# Patient Record
Sex: Female | Born: 1940 | Race: White | Hispanic: No | Marital: Married | Smoking: Never smoker
Health system: Southern US, Community
[De-identification: ages and names within clinical notes are randomized; demographics above are authoritative.]

## PROBLEM LIST (undated history)

## (undated) DIAGNOSIS — J309 Allergic rhinitis, unspecified: Secondary | ICD-10-CM

## (undated) DIAGNOSIS — K635 Polyp of colon: Secondary | ICD-10-CM

## (undated) DIAGNOSIS — E785 Hyperlipidemia, unspecified: Secondary | ICD-10-CM

## (undated) DIAGNOSIS — A809 Acute poliomyelitis, unspecified: Secondary | ICD-10-CM

## (undated) DIAGNOSIS — R569 Unspecified convulsions: Secondary | ICD-10-CM

## (undated) HISTORY — DX: Polyp of colon: K63.5

## (undated) HISTORY — PX: PATENT DUCTUS ARTERIOUS REPAIR: SHX269

## (undated) HISTORY — DX: Hyperlipidemia, unspecified: E78.5

## (undated) HISTORY — PX: COLONOSCOPY: SHX174

## (undated) HISTORY — DX: Allergic rhinitis, unspecified: J30.9

## (undated) HISTORY — DX: Acute poliomyelitis, unspecified: A80.9

## (undated) HISTORY — DX: Unspecified convulsions: R56.9

## (undated) HISTORY — PX: BREAST SURGERY: SHX581

## (undated) HISTORY — PX: TONSILLECTOMY: SUR1361

---

## 1980-07-20 DIAGNOSIS — R569 Unspecified convulsions: Secondary | ICD-10-CM

## 1980-07-20 HISTORY — DX: Unspecified convulsions: R56.9

## 1998-01-29 ENCOUNTER — Other Ambulatory Visit: Admission: RE | Admit: 1998-01-29 | Discharge: 1998-01-29 | Payer: Self-pay | Admitting: Obstetrics and Gynecology

## 1999-05-19 ENCOUNTER — Other Ambulatory Visit: Admission: RE | Admit: 1999-05-19 | Discharge: 1999-05-19 | Payer: Self-pay | Admitting: Obstetrics and Gynecology

## 2000-07-26 ENCOUNTER — Other Ambulatory Visit: Admission: RE | Admit: 2000-07-26 | Discharge: 2000-07-26 | Payer: Self-pay | Admitting: Obstetrics and Gynecology

## 2001-07-20 DIAGNOSIS — K635 Polyp of colon: Secondary | ICD-10-CM

## 2001-07-20 HISTORY — DX: Polyp of colon: K63.5

## 2001-08-02 ENCOUNTER — Other Ambulatory Visit: Admission: RE | Admit: 2001-08-02 | Discharge: 2001-08-02 | Payer: Self-pay | Admitting: Obstetrics and Gynecology

## 2001-10-10 ENCOUNTER — Encounter (INDEPENDENT_AMBULATORY_CARE_PROVIDER_SITE_OTHER): Payer: Self-pay | Admitting: *Deleted

## 2001-10-10 ENCOUNTER — Ambulatory Visit (HOSPITAL_COMMUNITY): Admission: RE | Admit: 2001-10-10 | Discharge: 2001-10-10 | Payer: Self-pay | Admitting: Gastroenterology

## 2001-10-10 ENCOUNTER — Encounter (INDEPENDENT_AMBULATORY_CARE_PROVIDER_SITE_OTHER): Payer: Self-pay

## 2002-08-17 ENCOUNTER — Other Ambulatory Visit: Admission: RE | Admit: 2002-08-17 | Discharge: 2002-08-17 | Payer: Self-pay | Admitting: Obstetrics and Gynecology

## 2003-09-03 ENCOUNTER — Encounter: Admission: RE | Admit: 2003-09-03 | Discharge: 2003-09-03 | Payer: Self-pay | Admitting: Internal Medicine

## 2003-10-16 ENCOUNTER — Other Ambulatory Visit: Admission: RE | Admit: 2003-10-16 | Discharge: 2003-10-16 | Payer: Self-pay | Admitting: Obstetrics and Gynecology

## 2004-06-25 ENCOUNTER — Ambulatory Visit: Payer: Self-pay | Admitting: Internal Medicine

## 2004-07-18 ENCOUNTER — Ambulatory Visit: Payer: Self-pay | Admitting: Internal Medicine

## 2005-02-25 ENCOUNTER — Ambulatory Visit: Payer: Self-pay | Admitting: Internal Medicine

## 2005-03-03 ENCOUNTER — Ambulatory Visit: Payer: Self-pay

## 2005-09-07 ENCOUNTER — Ambulatory Visit: Payer: Self-pay | Admitting: Internal Medicine

## 2005-10-21 ENCOUNTER — Ambulatory Visit: Payer: Self-pay | Admitting: Internal Medicine

## 2005-12-16 ENCOUNTER — Ambulatory Visit: Payer: Self-pay | Admitting: Internal Medicine

## 2006-02-01 ENCOUNTER — Ambulatory Visit: Payer: Self-pay | Admitting: Internal Medicine

## 2006-10-06 ENCOUNTER — Ambulatory Visit: Payer: Self-pay | Admitting: Internal Medicine

## 2006-10-14 DIAGNOSIS — E785 Hyperlipidemia, unspecified: Secondary | ICD-10-CM | POA: Insufficient documentation

## 2007-01-03 ENCOUNTER — Encounter: Payer: Self-pay | Admitting: Internal Medicine

## 2007-01-27 ENCOUNTER — Ambulatory Visit: Payer: Self-pay | Admitting: Family Medicine

## 2007-01-27 DIAGNOSIS — J209 Acute bronchitis, unspecified: Secondary | ICD-10-CM | POA: Insufficient documentation

## 2007-02-01 ENCOUNTER — Ambulatory Visit: Payer: Self-pay | Admitting: Family Medicine

## 2007-02-24 ENCOUNTER — Telehealth (INDEPENDENT_AMBULATORY_CARE_PROVIDER_SITE_OTHER): Payer: Self-pay | Admitting: *Deleted

## 2007-02-25 ENCOUNTER — Telehealth (INDEPENDENT_AMBULATORY_CARE_PROVIDER_SITE_OTHER): Payer: Self-pay | Admitting: *Deleted

## 2007-06-04 ENCOUNTER — Ambulatory Visit: Payer: Self-pay | Admitting: Family Medicine

## 2007-06-04 DIAGNOSIS — B370 Candidal stomatitis: Secondary | ICD-10-CM | POA: Insufficient documentation

## 2007-06-09 ENCOUNTER — Emergency Department (HOSPITAL_COMMUNITY): Admission: EM | Admit: 2007-06-09 | Discharge: 2007-06-09 | Payer: Self-pay | Admitting: Emergency Medicine

## 2007-06-09 ENCOUNTER — Telehealth: Payer: Self-pay | Admitting: Internal Medicine

## 2007-06-15 ENCOUNTER — Telehealth (INDEPENDENT_AMBULATORY_CARE_PROVIDER_SITE_OTHER): Payer: Self-pay | Admitting: *Deleted

## 2007-07-25 ENCOUNTER — Ambulatory Visit: Payer: Self-pay | Admitting: Internal Medicine

## 2007-07-25 ENCOUNTER — Telehealth: Payer: Self-pay | Admitting: Internal Medicine

## 2007-07-25 DIAGNOSIS — M2550 Pain in unspecified joint: Secondary | ICD-10-CM | POA: Insufficient documentation

## 2007-07-25 DIAGNOSIS — J3089 Other allergic rhinitis: Secondary | ICD-10-CM

## 2007-07-25 DIAGNOSIS — J302 Other seasonal allergic rhinitis: Secondary | ICD-10-CM | POA: Insufficient documentation

## 2007-07-27 ENCOUNTER — Ambulatory Visit: Payer: Self-pay | Admitting: Internal Medicine

## 2007-07-28 ENCOUNTER — Encounter: Payer: Self-pay | Admitting: Internal Medicine

## 2007-07-29 ENCOUNTER — Encounter (INDEPENDENT_AMBULATORY_CARE_PROVIDER_SITE_OTHER): Payer: Self-pay | Admitting: *Deleted

## 2007-07-29 ENCOUNTER — Telehealth (INDEPENDENT_AMBULATORY_CARE_PROVIDER_SITE_OTHER): Payer: Self-pay | Admitting: *Deleted

## 2007-09-01 ENCOUNTER — Ambulatory Visit: Payer: Self-pay | Admitting: Internal Medicine

## 2007-10-03 ENCOUNTER — Ambulatory Visit: Payer: Self-pay | Admitting: Internal Medicine

## 2007-10-06 ENCOUNTER — Ambulatory Visit: Payer: Self-pay | Admitting: Internal Medicine

## 2007-10-10 ENCOUNTER — Encounter: Payer: Self-pay | Admitting: Internal Medicine

## 2007-10-24 ENCOUNTER — Telehealth (INDEPENDENT_AMBULATORY_CARE_PROVIDER_SITE_OTHER): Payer: Self-pay | Admitting: *Deleted

## 2007-11-02 ENCOUNTER — Ambulatory Visit: Payer: Self-pay | Admitting: Internal Medicine

## 2008-01-06 ENCOUNTER — Telehealth (INDEPENDENT_AMBULATORY_CARE_PROVIDER_SITE_OTHER): Payer: Self-pay | Admitting: *Deleted

## 2008-01-06 ENCOUNTER — Ambulatory Visit: Payer: Self-pay | Admitting: Internal Medicine

## 2008-01-06 DIAGNOSIS — M549 Dorsalgia, unspecified: Secondary | ICD-10-CM | POA: Insufficient documentation

## 2008-01-06 LAB — CONVERTED CEMR LAB
Bilirubin Urine: NEGATIVE
Glucose, Urine, Semiquant: NEGATIVE
Urobilinogen, UA: 0.2
WBC Urine, dipstick: NEGATIVE

## 2008-01-16 ENCOUNTER — Telehealth (INDEPENDENT_AMBULATORY_CARE_PROVIDER_SITE_OTHER): Payer: Self-pay | Admitting: *Deleted

## 2008-01-30 ENCOUNTER — Ambulatory Visit: Payer: Self-pay | Admitting: Internal Medicine

## 2008-01-30 ENCOUNTER — Telehealth: Payer: Self-pay | Admitting: Internal Medicine

## 2008-05-04 ENCOUNTER — Ambulatory Visit: Payer: Self-pay | Admitting: Internal Medicine

## 2008-05-04 DIAGNOSIS — J069 Acute upper respiratory infection, unspecified: Secondary | ICD-10-CM | POA: Insufficient documentation

## 2008-05-04 LAB — CONVERTED CEMR LAB: Inflenza A Ag: NEGATIVE

## 2008-08-21 ENCOUNTER — Telehealth (INDEPENDENT_AMBULATORY_CARE_PROVIDER_SITE_OTHER): Payer: Self-pay | Admitting: *Deleted

## 2008-09-01 ENCOUNTER — Ambulatory Visit: Payer: Self-pay | Admitting: Family Medicine

## 2008-09-14 ENCOUNTER — Ambulatory Visit: Payer: Self-pay | Admitting: Internal Medicine

## 2008-09-14 DIAGNOSIS — R1032 Left lower quadrant pain: Secondary | ICD-10-CM | POA: Insufficient documentation

## 2008-09-14 DIAGNOSIS — Z86010 Personal history of colonic polyps: Secondary | ICD-10-CM | POA: Insufficient documentation

## 2008-09-17 LAB — CONVERTED CEMR LAB
Basophils Absolute: 0.1 10*3/uL (ref 0.0–0.1)
Basophils Relative: 1 % (ref 0–1)
Eosinophils Absolute: 0.2 10*3/uL (ref 0.0–0.7)
Eosinophils Relative: 4 % (ref 0–5)
Hemoglobin: 13.7 g/dL (ref 12.0–15.0)
MCHC: 33.4 g/dL (ref 30.0–36.0)
MCV: 93.6 fL (ref 78.0–100.0)
Monocytes Absolute: 0.3 10*3/uL (ref 0.1–1.0)
Neutro Abs: 3.1 10*3/uL (ref 1.7–7.7)
RDW: 13.3 % (ref 11.5–15.5)

## 2008-09-18 ENCOUNTER — Ambulatory Visit: Payer: Self-pay | Admitting: Internal Medicine

## 2008-09-18 ENCOUNTER — Encounter (INDEPENDENT_AMBULATORY_CARE_PROVIDER_SITE_OTHER): Payer: Self-pay | Admitting: *Deleted

## 2008-09-20 ENCOUNTER — Encounter (INDEPENDENT_AMBULATORY_CARE_PROVIDER_SITE_OTHER): Payer: Self-pay | Admitting: *Deleted

## 2008-09-20 ENCOUNTER — Telehealth (INDEPENDENT_AMBULATORY_CARE_PROVIDER_SITE_OTHER): Payer: Self-pay | Admitting: *Deleted

## 2008-09-26 ENCOUNTER — Encounter: Payer: Self-pay | Admitting: Internal Medicine

## 2008-09-26 ENCOUNTER — Telehealth: Payer: Self-pay | Admitting: Internal Medicine

## 2008-10-11 ENCOUNTER — Encounter: Payer: Self-pay | Admitting: Internal Medicine

## 2008-12-06 ENCOUNTER — Encounter: Payer: Self-pay | Admitting: Internal Medicine

## 2009-01-08 ENCOUNTER — Telehealth: Payer: Self-pay | Admitting: Internal Medicine

## 2009-01-09 ENCOUNTER — Ambulatory Visit: Payer: Self-pay | Admitting: Gastroenterology

## 2009-04-22 ENCOUNTER — Ambulatory Visit: Payer: Self-pay | Admitting: Internal Medicine

## 2009-05-07 ENCOUNTER — Encounter: Payer: Self-pay | Admitting: Internal Medicine

## 2009-05-16 ENCOUNTER — Encounter: Payer: Self-pay | Admitting: Internal Medicine

## 2009-05-23 ENCOUNTER — Telehealth (INDEPENDENT_AMBULATORY_CARE_PROVIDER_SITE_OTHER): Payer: Self-pay | Admitting: *Deleted

## 2009-09-09 ENCOUNTER — Encounter: Payer: Self-pay | Admitting: Internal Medicine

## 2009-09-19 ENCOUNTER — Encounter: Payer: Self-pay | Admitting: Internal Medicine

## 2009-09-30 ENCOUNTER — Telehealth (INDEPENDENT_AMBULATORY_CARE_PROVIDER_SITE_OTHER): Payer: Self-pay | Admitting: *Deleted

## 2009-09-30 ENCOUNTER — Ambulatory Visit: Payer: Self-pay | Admitting: Internal Medicine

## 2009-10-10 ENCOUNTER — Telehealth (INDEPENDENT_AMBULATORY_CARE_PROVIDER_SITE_OTHER): Payer: Self-pay | Admitting: *Deleted

## 2010-01-23 ENCOUNTER — Ambulatory Visit: Payer: Self-pay | Admitting: Internal Medicine

## 2010-04-21 ENCOUNTER — Ambulatory Visit: Payer: Self-pay | Admitting: Internal Medicine

## 2010-04-21 DIAGNOSIS — R109 Unspecified abdominal pain: Secondary | ICD-10-CM | POA: Insufficient documentation

## 2010-04-21 LAB — CONVERTED CEMR LAB
Glucose, Urine, Semiquant: NEGATIVE
Protein, U semiquant: NEGATIVE
Urobilinogen, UA: 0.2
WBC Urine, dipstick: NEGATIVE

## 2010-04-22 ENCOUNTER — Encounter: Payer: Self-pay | Admitting: Internal Medicine

## 2010-04-22 LAB — CONVERTED CEMR LAB
Bilirubin, Direct: 0.2 mg/dL (ref 0.0–0.3)
Eosinophils Absolute: 0.2 10*3/uL (ref 0.0–0.7)
MCHC: 34.1 g/dL (ref 30.0–36.0)
MCV: 93.1 fL (ref 78.0–100.0)
Monocytes Absolute: 0.6 10*3/uL (ref 0.1–1.0)
Neutrophils Relative %: 69.5 % (ref 43.0–77.0)
Platelets: 236 10*3/uL (ref 150.0–400.0)
RDW: 13.7 % (ref 11.5–14.6)
Total Bilirubin: 0.8 mg/dL (ref 0.3–1.2)

## 2010-04-25 ENCOUNTER — Encounter: Payer: Self-pay | Admitting: Internal Medicine

## 2010-04-29 ENCOUNTER — Ambulatory Visit: Payer: Self-pay | Admitting: Internal Medicine

## 2010-05-05 ENCOUNTER — Ambulatory Visit: Payer: Self-pay | Admitting: Internal Medicine

## 2010-05-05 ENCOUNTER — Encounter (INDEPENDENT_AMBULATORY_CARE_PROVIDER_SITE_OTHER): Payer: Self-pay | Admitting: *Deleted

## 2010-05-05 LAB — CONVERTED CEMR LAB
OCCULT 2: NEGATIVE
OCCULT 3: NEGATIVE

## 2010-05-12 ENCOUNTER — Encounter: Payer: Self-pay | Admitting: Internal Medicine

## 2010-05-21 ENCOUNTER — Inpatient Hospital Stay (HOSPITAL_COMMUNITY): Admission: AD | Admit: 2010-05-21 | Discharge: 2010-05-21 | Payer: Self-pay | Admitting: Obstetrics and Gynecology

## 2010-05-21 ENCOUNTER — Encounter: Payer: Self-pay | Admitting: Internal Medicine

## 2010-05-21 DIAGNOSIS — IMO0002 Reserved for concepts with insufficient information to code with codable children: Secondary | ICD-10-CM

## 2010-05-21 DIAGNOSIS — R339 Retention of urine, unspecified: Secondary | ICD-10-CM

## 2010-05-21 DIAGNOSIS — Y838 Other surgical procedures as the cause of abnormal reaction of the patient, or of later complication, without mention of misadventure at the time of the procedure: Secondary | ICD-10-CM

## 2010-05-21 DIAGNOSIS — N9989 Other postprocedural complications and disorders of genitourinary system: Secondary | ICD-10-CM

## 2010-05-23 ENCOUNTER — Encounter: Payer: Self-pay | Admitting: Internal Medicine

## 2010-06-06 ENCOUNTER — Encounter: Payer: Self-pay | Admitting: Internal Medicine

## 2010-06-09 ENCOUNTER — Ambulatory Visit: Payer: Self-pay | Admitting: Internal Medicine

## 2010-06-09 ENCOUNTER — Telehealth: Payer: Self-pay | Admitting: Internal Medicine

## 2010-06-09 DIAGNOSIS — H8110 Benign paroxysmal vertigo, unspecified ear: Secondary | ICD-10-CM | POA: Insufficient documentation

## 2010-06-09 DIAGNOSIS — N83209 Unspecified ovarian cyst, unspecified side: Secondary | ICD-10-CM | POA: Insufficient documentation

## 2010-06-10 ENCOUNTER — Ambulatory Visit: Payer: Self-pay | Admitting: Internal Medicine

## 2010-06-16 LAB — CONVERTED CEMR LAB
Basophils Relative: 1.6 % (ref 0.0–3.0)
Chloride: 99 meq/L (ref 96–112)
Creatinine, Ser: 0.8 mg/dL (ref 0.4–1.2)
Eosinophils Relative: 9.2 % — ABNORMAL HIGH (ref 0.0–5.0)
GFR calc non Af Amer: 75.28 mL/min (ref >60)
HCT: 41.3 % (ref 36.0–46.0)
MCV: 93.1 fL (ref 78.0–100.0)
Monocytes Absolute: 0.4 10*3/uL (ref 0.1–1.0)
Monocytes Relative: 5.1 % (ref 3.0–12.0)
Neutrophils Relative %: 56.6 % (ref 43.0–77.0)
Potassium: 6.2 meq/L (ref 3.5–5.1)
RBC: 4.43 M/uL (ref 3.87–5.11)
WBC: 8.6 10*3/uL (ref 4.5–10.5)

## 2010-06-20 ENCOUNTER — Telehealth: Payer: Self-pay | Admitting: Internal Medicine

## 2010-07-04 ENCOUNTER — Encounter: Payer: Self-pay | Admitting: Internal Medicine

## 2010-08-09 ENCOUNTER — Encounter: Payer: Self-pay | Admitting: Internal Medicine

## 2010-08-19 NOTE — Progress Notes (Signed)
Summary: Request for Zostavax  Phone Note Call from Patient Call back at Home Phone [phone]   Caller: Patient Summary of Call: Message left on VM: Patient would like a rx for shingles vaccine sent in to [facility] on [address], they only have 6 doses left. Initial call taken by: Shonna Chock,  September 30, 2009 12:31 PM    New/Updated Medications: ZOSTAVAX 62952 UNT/0.65ML SOLR (ZOSTER VACCINE LIVE) 1 injection Subcutaneously x 1 Prescriptions: ZOSTAVAX 84132 UNT/0.65ML SOLR (ZOSTER VACCINE LIVE) 1 injection Subcutaneously x 1  #1 x 0   Entered by:   Shonna Chock   Authorized by:   Marga Melnick MD   Signed by:   Shonna Chock on 09/30/2009   Method used:   Electronically to        [facility] [address] [phone]* (retail)       [address]       [address]       [place], [place]  [address]       Ph: [phone]       Fax: [phone]   RxID:   [id number]

## 2010-08-19 NOTE — Letter (Signed)
Summary: Results Follow up Letter  [place] [facility]  [address] [place], [place] [address]   Phone: [phone]  Fax: [phone]    05/05/2010 MRN: [id number]  [facility] [address] [place], [place]  [address]  Dear Ms. Dollard,  The following are the results of your recent test(s):  Test         Result    Pap Smear:        Normal _____  Not Normal _____ Comments: ______________________________________________________ Cholesterol: LDL(Bad cholesterol):         Your goal is less than:         HDL (Good cholesterol):       Your goal is more than: Comments:  ______________________________________________________ Mammogram:        Normal _____  Not Normal _____ Comments:  ___________________________________________________________________ Hemoccult:        Normal __X___  Not normal _______ Comments:    _____________________________________________________________________ Other Tests:    We routinely do not discuss normal results over the telephone.  If you desire a copy of the results, or you have any questions about this information we can discuss them at your next office visit.   Sincerely,

## 2010-08-19 NOTE — Letter (Signed)
Summary: [facility]  [facility]   Imported By: Lanelle Bal 05/02/2010 08:28:22  _____________________________________________________________________  External Attachment:    Type:   Image     Comment:   External Document

## 2010-08-19 NOTE — Assessment & Plan Note (Signed)
Summary: SINUS INFECTION//PH   Vital Signs:  Patient profile:   70 year old female Weight:      125.8 pounds BMI:     21.67 Temp:     97.9 degrees F oral Pulse rate:   64 / minute Resp:     14 per minute BP sitting:   110 / 70  (left arm) Cuff size:   regular  Vitals Entered By: Shonna Chock (January 23, 2010 12:20 PM) CC: Sinus Infection sx since Saturday, URI symptoms Comments REVIEWED MED LIST, PATIENT AGREED DOSE AND INSTRUCTION CORRECT    Primary Care Provider:  Marga Melnick, MD  CC:  Sinus Infection sx since Saturday and URI symptoms.  History of Present Illness: Onset 01/18/2010 as nasal congestion  & earache while in [place] while on cruise. Purulent mucus as of 07/04. The patient reports purulent nasal discharge, sore throat, and productive cough with purulence.  Associated symptoms include  mild wheezing.  The patient denies fever and dyspnea.  The patient also reports  frontal headache.  The patient denies itchy watery eyes and sneezing.  Risk factors for Strep sinusitis include bilateral facial pain and  nasal discharge.  The patient denies the following risk factors for Strep sinusitis: tooth pain and tender adenopathy.    Allergies: 1)  ! Prednisone 2)  ! * Tetanus  Physical Exam  General:  Thin but in no acute distress; alert,appropriate and cooperative throughout examination Ears:  External ear exam shows no significant lesions or deformities.  Otoscopic examination reveals clear canals, tympanic membranes are intact bilaterally without bulging, retraction, inflammation or discharge. Hearing is grossly normal bilaterally. Nose:  External nasal examination shows no deformity or inflammation. Nasal mucosa are pink and moist without lesions or exudates.Septal dislocation. Hyponasal speech Mouth:  Oral mucosa and oropharynx without lesions or exudates.  Teeth in good repair. Lungs:  Normal respiratory effort, chest expands symmetrically. Lungs are clear to auscultation,  no crackles or wheezes. dry cough Cervical Nodes:  No lymphadenopathy noted. ? cervical spur on L Axillary Nodes:  No palpable lymphadenopathy   Impression & Recommendations:  Problem # 1:  SINUSITIS- ACUTE-NOS (ICD-461.9)  Her updated medication list for this problem includes:    Amoxicillin-pot Clavulanate 875-125 Mg Tabs (Amoxicillin-pot clavulanate) .Marland Kitchen... 1 every 12 hrs with a meal  Orders: Prescription Created Electronically [phone])  Problem # 2:  BRONCHITIS, ACUTE (ICD-466.0)  Her updated medication list for this problem includes:    Singulair 10 Mg Tabs (Montelukast sodium) .Marland Kitchen... 1 once daily    Symbicort 160-4.5 Mcg/act Aero (Budesonide-formoterol fumarate) .Marland Kitchen... 2 puffs q 12 hrs    Combivent 103-18 Mcg/act Aero (Ipratropium-albuterol) .Marland Kitchen... 1-2 puffs every 4 -6 hrs  as needed    Spiriva Handihaler 18 Mcg Caps (Tiotropium bromide monohydrate) ..... Inhale contents of a capsule once daily after symbicort    Amoxicillin-pot Clavulanate 875-125 Mg Tabs (Amoxicillin-pot clavulanate) .Marland Kitchen... 1 every 12 hrs with a meal  Orders: Prescription Created Electronically [phone])  Complete Medication List: 1)  Singulair 10 Mg Tabs (Montelukast sodium) .Marland Kitchen.. 1 once daily 2)  Symbicort 160-4.5 Mcg/act Aero (Budesonide-formoterol fumarate) .... 2 puffs q 12 hrs 3)  L-lysine Hcl 500 Mg Tabs (Lysine hcl) .... As needed 4)  Bayer Aspirin 325 Mg Tabs (Aspirin) .... As needed 5)  Lexapro 10 Mg Tabs (Escitalopram oxalate) .... 1/2 once daily 6)  Combivent 103-18 Mcg/act Aero (Ipratropium-albuterol) .Marland Kitchen.. 1-2 puffs every 4 -6 hrs  as needed 7)  Spiriva Handihaler 18 Mcg Caps (Tiotropium  bromide monohydrate) .... Inhale contents of a capsule once daily after symbicort 8)  Zostavax 61607 Unt/0.27ml Solr (Zoster vaccine live) .Marland Kitchen.. 1 injection subcutaneously x 1 9)  Amoxicillin-pot Clavulanate 875-125 Mg Tabs (Amoxicillin-pot clavulanate) .Marland Kitchen.. 1 every 12 hrs with a meal  Patient Instructions: 1)  Neti  pot once daily - two times a day as needed until sinuses are  clear. 2)  Drink as much fluid as you can tolerate for the next few days. Prescriptions: AMOXICILLIN-POT CLAVULANATE 875-125 MG TABS (AMOXICILLIN-POT CLAVULANATE) 1 every 12 hrs with a meal  #20 x 0   Entered and Authorized by:   Marga Melnick MD   Signed by:   Marga Melnick MD on 01/23/2010   Method used:   Faxed to ...       [facility]* (retail)       [address]       [place], [place]  [address]       Ph: [phone] or [phone]       Fax: [phone]   RxID:   [id number]

## 2010-08-19 NOTE — Letter (Signed)
Summary: [facility]  [facility]   Imported By: Lanelle Bal 06/20/2010 11:44:23  _____________________________________________________________________  External Attachment:    Type:   Image     Comment:   External Document

## 2010-08-19 NOTE — Letter (Signed)
Summary: [facility]  [facility]   Imported By: Lanelle Bal 05/07/2010 16:07:17  _____________________________________________________________________  External Attachment:    Type:   Image     Comment:   External Document

## 2010-08-19 NOTE — Letter (Signed)
Summary: [facility]  [facility]   Imported By: Lanelle Bal 06/10/2010 10:02:24  _____________________________________________________________________  External Attachment:    Type:   Image     Comment:   External Document

## 2010-08-19 NOTE — Assessment & Plan Note (Signed)
Summary: FLU/KN  Nurse Visit   Allergies: 1)  ! Prednisone 2)  ! * Tetanus  Orders Added: 1)  Admin 1st Vaccine [90471] 2)  Flu Vaccine 46yrs + [98119] Flu Vaccine Consent Questions     Do you have a history of severe allergic reactions to this vaccine? no    Any prior history of allergic reactions to egg and/or gelatin? no    Do you have a sensitivity to the preservative Thimersol? no    Do you have a past history of Guillan-Barre Syndrome? no    Do you currently have an acute febrile illness? no    Have you ever had a severe reaction to latex? no    Vaccine information given and explained to patient? yes    Are you currently pregnant? no    Lot Number:[id number]   Exp Date:01/17/2011   Site Given  Left Deltoid IM .lbflu

## 2010-08-19 NOTE — Progress Notes (Signed)
Summary: New Rx Request  Phone Note Refill Request Message from:  Patient on [facility]  Refills Requested: Medication #1:  SPIRIVA HANDIHALER 18 MCG CAPS inhale contents of a capsule once daily after Symbicort   Dosage confirmed as above?Dosage Confirmed Patient was given some samples of Spiriva to try. She was told to call back and ask for a rx if this was working for her. She says it is and would love a rx for it, if it has a generic, she would love to try that too.   Initial call taken by: Harold Barban,  October 10, 2009 9:35 AM  Follow-up for Phone Call        Dr.Hopper please advise if there is another med in the same class as [place] with a generic Follow-up by: Shonna Chock,  October 10, 2009 11:31 AM  Additional Follow-up for Phone Call Additional follow up Details #1::        Per Dr.Hopper no generic med in the class of Sprivia Additional Follow-up by: Shonna Chock,  October 10, 2009 4:50 PM    Prescriptions: SPIRIVA HANDIHALER 18 MCG CAPS (TIOTROPIUM BROMIDE MONOHYDRATE) inhale contents of a capsule once daily after Symbicort  #1 month x 11   Entered by:   Shonna Chock   Authorized by:   Marga Melnick MD   Signed by:   Shonna Chock on 10/10/2009   Method used:   Electronically to        [facility]* (retail)       [address]       [place], [place]  [address]       Ph: [phone] or [phone]       Fax: [phone]   RxID:   [id number]

## 2010-08-19 NOTE — Letter (Signed)
Summary: [facility]  [facility]   Imported By: Lanelle Bal 05/22/2010 12:09:37  _____________________________________________________________________  External Attachment:    Type:   Image     Comment:   External Document

## 2010-08-19 NOTE — Progress Notes (Signed)
Summary: Refill Request  Phone Note Refill Request Message from:  Patient on June 20, 2010 9:34 AM  Refills Requested: Medication #1:  DIAZEPAM 2 MG TABS 1 every 8 -12 hrs as needed for the vertigo.   Dosage confirmed as above?Dosage Confirmed   Supply Requested: 1 month Patient wants to know if she can recieve a refill on this medication or if you wanted her to go without it for now. She said the medicine was helping but she is out now and it has no refills. Needs to be sent to [facility].   Initial call taken by: Harold Barban,  June 20, 2010 9:35 AM  Follow-up for Phone Call        OK  X1 Follow-up by: Marga Melnick MD,  June 20, 2010 10:24 AM  Additional Follow-up for Phone Call Additional follow up Details #1::        Patient notified. Additional Follow-up by: Lucious Groves CMA,  June 20, 2010 11:33 AM    Prescriptions: DIAZEPAM 2 MG TABS (DIAZEPAM) 1 every 8 -12 hrs as needed for the vertigo  #15 x 1   Entered by:   Lucious Groves CMA   Authorized by:   Marga Melnick MD   Signed by:   Lucious Groves CMA on 06/20/2010   Method used:   Printed then faxed to ...       [facility]* (retail)       [address]       [place], [place]  [address]       Ph: [phone] or [phone]       Fax: [phone]   RxID:   [id number]

## 2010-08-19 NOTE — Assessment & Plan Note (Signed)
Summary: coughing/kdc   Vital Signs:  Patient profile:   70 year old female Weight:      134.2 pounds O2 Sat:      97 % Temp:     98.7 degrees F oral Pulse rate:   60 / minute Resp:     16 per minute BP sitting:   140 / 82  (right arm) Cuff size:   regular  Vitals Entered By: Shonna Chock (September 30, 2009 10:30 AM) CC: Cough x a very long time Comments REVIEWED MED LIST, PATIENT AGREED DOSE AND INSTRUCTION CORRECT    Primary Care Provider:  Marga Melnick, MD  CC:  Cough x a very long time.  History of Present Illness: Progression of cough  over past month w/o trigger except mild RTI which resolved with OTCs. She has continued Symbicort & Singulair but never filled Rx for Combivent. Cough is not present @ night.  Allergies: 1)  ! Prednisone 2)  ! * Tetanus  Review of Systems General:  Denies chills, fever, and sweats. ENT:  Denies earache, nasal congestion, sinus pressure, and sore throat; No frontal headache, facial pain or purulence. Resp:  Complains of cough, sputum productive, and wheezing; denies chest pain with inspiration and coughing up blood; Sputum is foamy to white. GI:  Denies indigestion. Allergy:  Denies itching eyes and sneezing.  Physical Exam  General:  Thin,well-nourished,in no acute distress; alert,appropriate and cooperative throughout examination Ears:  External ear exam shows no significant lesions or deformities.  Otoscopic examination reveals clear canals, tympanic membranes are intact bilaterally without bulging, retraction, inflammation or discharge. Hearing is grossly normal bilaterally. Nose:  External nasal examination shows no deformity or inflammation. Nasal mucosa are pink and moist without lesions or exudates. Septal dislocation Mouth:  Oral mucosa and oropharynx without lesions or exudates.  Teeth in good repair. Lungs:  Normal respiratory effort, chest expands symmetrically. Lungs are clear to auscultation, no crackles or wheezes. Dry  cough Heart:  Normal rate and regular rhythm. S1 and S2 normal without gallop, murmur, click, rub. S4 Cervical Nodes:  No lymphadenopathy noted Axillary Nodes:  No palpable lymphadenopathy   Impression & Recommendations:  Problem # 1:  ASTHMA NOS W/O STATUS ASTHMATICUS (ICD-493.90)  Her updated medication list for this problem includes:    Singulair 10 Mg Tabs (Montelukast sodium) .Marland Kitchen... 1 once daily    Symbicort 160-4.5 Mcg/act Aero (Budesonide-formoterol fumarate) .Marland Kitchen... 2 puffs q 12 hrs    Combivent 103-18 Mcg/act Aero (Ipratropium-albuterol) .Marland Kitchen... 1-2 puffs every 4 -6 hrs  as needed    Spiriva Handihaler 18 Mcg Caps (Tiotropium bromide monohydrate) ..... Inhale contents of a capsule once daily after symbicort  Orders: Prescription Created Electronically [phone])  Complete Medication List: 1)  Singulair 10 Mg Tabs (Montelukast sodium) .Marland Kitchen.. 1 once daily 2)  Symbicort 160-4.5 Mcg/act Aero (Budesonide-formoterol fumarate) .... 2 puffs q 12 hrs 3)  L-lysine Hcl 500 Mg Tabs (Lysine hcl) .... As needed 4)  Bayer Aspirin 325 Mg Tabs (Aspirin) .... As needed 5)  Lexapro 10 Mg Tabs (Escitalopram oxalate) .... 1/2 once daily 6)  Combivent 103-18 Mcg/act Aero (Ipratropium-albuterol) .Marland Kitchen.. 1-2 puffs every 4 -6 hrs  as needed 7)  Spiriva Handihaler 18 Mcg Caps (Tiotropium bromide monohydrate) .... Inhale contents of a capsule once daily after symbicort  Patient Instructions: 1)  Use Spiriva in am AFTER 2 nd Symbicort inhalation. Discuss possible albuteral allergy as per Dr Valrie Hart Prescriptions: SPIRIVA HANDIHALER 18 MCG CAPS (TIOTROPIUM BROMIDE MONOHYDRATE) inhale contents of  a capsule once daily after Symbicort  #1 month x 11   Entered and Authorized by:   Marga Melnick MD   Signed by:   Marga Melnick MD on 09/30/2009   Method used:   Print then Give to Patient   RxID:   [id number] SYMBICORT 160-4.5 MCG/ACT  AERO (BUDESONIDE-FORMOTEROL FUMARATE) 2 puffs q 12 hrs Brand medically  necessary #10.2 x 11   Entered and Authorized by:   Marga Melnick MD   Signed by:   Marga Melnick MD on 09/30/2009   Method used:   Faxed to ...       [facility] (retail)       [address]       [place], [place]  [address]       Ph: [phone] or [phone]       Fax: [phone]   RxID:   [id number] SINGULAIR 10 MG  TABS (MONTELUKAST SODIUM) 1 once daily Brand medically necessary #30 Tablet x 11   Entered and Authorized by:   Marga Melnick MD   Signed by:   Marga Melnick MD on 09/30/2009   Method used:   Faxed to ...       [facility] (retail)       [address]       [place], [place]  [address]       Ph: [phone] or [phone]       Fax: [phone]   RxID:   [id number]

## 2010-08-19 NOTE — Assessment & Plan Note (Signed)
Summary: stomach problem//kn   Vital Signs:  Patient profile:   70 year old female Weight:      127.8 pounds BMI:     22.02 Temp:     98.6 degrees F oral Pulse rate:   80 / minute Resp:     16 per minute BP sitting:   122 / 78  (left arm) Cuff size:   regular  Vitals Entered By: Shonna Chock CMA (April 21, 2010 9:24 AM) CC: Stomach Concerns and fever, Abdominal pain   Primary Care Provider:  Marga Melnick, MD  CC:  Stomach Concerns and fever and Abdominal pain.  History of Present Illness: Abdominal Pain      This is a 70 year old woman who presents with Abdominal discomfort since  05/15/2010.She had  initial symptoms of N&V on 05/12/2010.  The patient reports anorexia, but denies nausea &  vomiting since 10/26. She denies  diarrhea, constipation, melena, hematochezia, and hematemesis.  The location of the pain is right  & left lower quadrant, and suprapubic area ( "a crescent").  The pain is described as constant and dull.  Associated symptoms include fever & bloating.  The patient denies the following symptoms: weight loss, dysuria, chest pain, jaundice, dark urine, and vaginal bleeding.  The pain has no excaerbating factors. The pain is better with rest.   Rx: none. PMH : negative colonoscopy 2010. Last Gyn exam 04/2009; no PMH of ovarian disease.  Current Medications (verified): 1)  Singulair 10 Mg  Tabs (Montelukast Sodium) .Marland Kitchen.. 1 Once Daily 2)  Symbicort 160-4.5 Mcg/act  Aero (Budesonide-Formoterol Fumarate) .... 2 Puffs Q 12 Hrs 3)  L-Lysine Hcl 500 Mg  Tabs (Lysine Hcl) .... As Needed 4)  Bayer Aspirin 325 Mg  Tabs (Aspirin) .... As Needed 5)  Lexapro 10 Mg Tabs (Escitalopram Oxalate) .... 1/2 Once Daily 6)  Combivent 103-18 Mcg/act Aero (Ipratropium-Albuterol) .Marland Kitchen.. 1-2 Puffs Every 4 -6 Hrs  As Needed 7)  Spiriva Handihaler 18 Mcg Caps (Tiotropium Bromide Monohydrate) .... Inhale Contents of A Capsule Once Daily After Symbicort  Allergies: 1)  ! Prednisone 2)  ! *  Tetanus  Past History:  Past Medical History: Asthma Hyperlipidemia bronchitis questionable left apex Chest Xray  changes1/27/2005 Colonic polyps,PMH  of (Dr. Ritta Slot)  Past Surgical History: PDA AGE 40 Tonsillectomy Lumpectomy SEIZURE 1982 COLONOSCOPY POLYPS 2003 & 2008, Dr Kinnie Scales. Colonoscopy negative 2010 GRAVIDA  FIVE PARA 2  Physical Exam  General:   Thin but well-nourished,in no acute distress; alert,appropriate and cooperative throughout examination Eyes:  No corneal or conjunctival inflammation noted.No icterus Mouth:  Oral mucosa and oropharynx without lesions or exudates.  Tongue moist Lungs:  Normal respiratory effort, chest expands symmetrically. Lungs are clear to auscultation, no crackles or wheezes. Heart:  normal rate, regular rhythm, no gallop, no rub, no JVD, no HJR, and grade 1 /6 systolic murmur.   Abdomen:  Bowel sounds positive,abdomen soft  but slightly tender in lower abdomen  without masses, organomegaly or hernias noted. Msk:  No deformity or scoliosis noted of thoracic or lumbar spine.   No flank tenderness Pulses:  R and L carotid,radial,dorsalis pedis and posterior tibial pulses are full and equal bilaterally Extremities:  No clubbing, cyanosis, edema Neurologic:  alert & oriented X3.   Skin:  Intact without suspicious lesions or rashes. No jaundice. No tenting Cervical Nodes:  No lymphadenopathy noted Axillary Nodes:  No palpable lymphadenopathy Psych:  memory intact for recent and remote, normally interactive, and good  eye contact.     Impression & Recommendations:  Problem # 1:  ABDOMINAL PAIN (ICD-789.00)  Orders: UA Dipstick w/o Micro (manual) (91478) Venipuncture (29562) TLB-CBC Platelet - w/Differential (85025-CBCD) TLB-Hepatic/Liver Function Pnl (80076-HEPATIC) Radiology Referral (Radiology) Specimen Handling (99000)  Complete Medication List: 1)  Singulair 10 Mg Tabs (Montelukast sodium) .Marland Kitchen.. 1 once daily 2)  Symbicort  160-4.5 Mcg/act Aero (Budesonide-formoterol fumarate) .... 2 puffs q 12 hrs 3)  L-lysine Hcl 500 Mg Tabs (Lysine hcl) .... As needed 4)  Bayer Aspirin 325 Mg Tabs (Aspirin) .... As needed 5)  Lexapro 10 Mg Tabs (Escitalopram oxalate) .... 1/2 once daily 6)  Combivent 103-18 Mcg/act Aero (Ipratropium-albuterol) .Marland Kitchen.. 1-2 puffs every 4 -6 hrs  as needed 7)  Spiriva Handihaler 18 Mcg Caps (Tiotropium bromide monohydrate) .... Inhale contents of a capsule once daily after symbicort 8)  Hyoscyamine Sulfate 0.125 Mg Subl (Hyoscyamine sulfate) .Marland Kitchen.. 1 under tongue every 4 hrs as needed for pain  Patient Instructions: 1)  Complete stool cards please. 2)  Drink clear liquids only for the next 24 hours, then slowly add other liquids and food as you  tolerate them. Prescriptions: HYOSCYAMINE SULFATE 0.125 MG SUBL (HYOSCYAMINE SULFATE) 1 under tongue every 4 hrs as needed for pain  #15 x 0   Entered and Authorized by:   Marga Melnick MD   Signed by:   Marga Melnick MD on 04/21/2010   Method used:   Print then Give to Patient   RxID:   [id number]   Laboratory Results   Urine Tests    Routine Urinalysis   Color: yellow Glucose: negative   (Normal Range: Negative) Bilirubin: negative   (Normal Range: Negative) Ketone: negative   (Normal Range: Negative) Spec. Gravity: <1.005   (Normal Range: 1.003-1.035) Blood: negative   (Normal Range: Negative) pH: 7.0   (Normal Range: 5.0-8.0) Protein: negative   (Normal Range: Negative) Urobilinogen: 0.2   (Normal Range: 0-1) Nitrite: negative   (Normal Range: Negative) Leukocyte Esterace: negative   (Normal Range: Negative)

## 2010-08-19 NOTE — Letter (Signed)
Summary: Fax from [facility]  Fax from [facility]   Imported By: Lanelle Bal 06/18/2010 13:42:41  _____________________________________________________________________  External Attachment:    Type:   Image     Comment:   External Document

## 2010-08-19 NOTE — Assessment & Plan Note (Signed)
Summary: vertigo/kn   Vital Signs:  Patient profile:   70 year old female Height:      64 inches (162.56 cm) Weight:      131.38 pounds (59.72 kg) BMI:     22.63 Temp:     97.8 degrees F (36.56 degrees C) oral Resp:     16 per minute BP sitting:   112 / 70  (left arm) Cuff size:   regular  Vitals Entered By: Lucious Groves CMA (June 09, 2010 12:08 PM) CC: C/O vertigo since Thursday./kb, Syncope, URI symptoms Is Patient Diabetic? No Pain Assessment Patient in pain? no      Comments Patient notes that it is worse when laying down. She also notes that she has been having dizziness, HA, sore throat, and ear ache. Patient states that she has not had N&V, disorientation, exhos, and blurred vision. Per patient she is no longer on Lexapro and never filled Hyoscyamine prescription. /kb   Primary Care Provider:  Marga Melnick, MD  CC:  C/O vertigo since Thursday./kb, Syncope, and URI symptoms.  History of Present Illness:      This is a 70 year old woman who presents with  vertigo since 11/17.  The patient reports lightheadedness, but denies loss of consciousness, palpitations, chest pain, and shortness of breath.  Associated symptoms include non significant  headache frontally ," like  a mask".  The patient denies the following symptoms: abdominal discomfort, nausea, vomiting, feeling warm, pallor, diaphoresis, focal weakness, and blurred vision.  The patient reports the following precipitating factors: change in position.  The loss of consciousness occurred in the setting of supine position.       The patient reports sore throat and  bilateral earache since 11/17 also, but denies nasal congestion, purulent nasal discharge, and productive cough ( she has cough with her asthma).  The patient denies fever, dyspnea, and wheezing.  The patient denies itchy watery eyes and sneezing.  The patient denies the following risk factors for Strep sinusitis: unilateral facial pain, tooth pain, and tender  adenopathy.    Current Medications (verified): 1)  Singulair 10 Mg  Tabs (Montelukast Sodium) .Marland Kitchen.. 1 Once Daily 2)  Symbicort 160-4.5 Mcg/act  Aero (Budesonide-Formoterol Fumarate) .... 2 Puffs Q 12 Hrs 3)  L-Lysine Hcl 500 Mg  Tabs (Lysine Hcl) .... As Needed 4)  Bayer Aspirin 325 Mg  Tabs (Aspirin) .... As Needed 5)  Combivent 103-18 Mcg/act Aero (Ipratropium-Albuterol) .Marland Kitchen.. 1-2 Puffs Every 4 -6 Hrs  As Needed 6)  Spiriva Handihaler 18 Mcg Caps (Tiotropium Bromide Monohydrate) .... Inhale Contents of A Capsule Once Daily After Symbicort  Allergies (verified): 1)  ! Prednisone 2)  ! * Tetanus  Review of Systems General:  Denies chills and sweats. ENT:  Denies decreased hearing and ringing in ears. Neuro:  Denies brief paralysis, disturbances in coordination, numbness, tingling, and weakness; Slightly unsteady w/o frank balance; she has been sedentaary since Gyn surgery.  Physical Exam  General:  Thin,well-nourished,in no acute distress; alert,appropriate and cooperative throughout examination Eyes:  No corneal or conjunctival inflammation noted. EOMI. Perrla. Field of  Vision grossly normal. Arcus Senilis. No nysyagmus even with subjective vertigo Ears:  External ear exam shows no significant lesions or deformities.  Otoscopic examination reveals clear canals, tympanic membranes are intact bilaterally without bulging, retraction, inflammation or discharge. Hearing is grossly normal bilaterally.. Tuning fork exam normal Nose:  External nasal examination shows no deformity or inflammation. Nasal mucosa are pink and moist without lesions or exudates.  Slight septal dislocation Mouth:  Oral mucosa and oropharynx without lesions or exudates.  Teeth in good repair. Lungs:  Normal respiratory effort, chest expands symmetrically. Lungs are clear to auscultation, no crackles or wheezes. Heart:  Normal rate and regular rhythm. S1 and S2 normal without gallop, murmur, click, rub or other extra  sounds. Pulses:  R and L carotid  pulses are full and equal bilaterally w/o bruits Extremities:  No clubbing, cyanosis, edema. Neurologic:  alert & oriented X3, cranial nerves II-XII intact, strength normal in all extremities, sensation intact to light touch, gait normal, DTRs symmetrical and normal, finger-to-nose normal, and Romberg negative.   Skin:  Intact without suspicious lesions or rashes Cervical Nodes:  No lymphadenopathy noted Axillary Nodes:  No palpable lymphadenopathy Psych:  memory intact for recent and remote, normally interactive, and good eye contact.     Impression & Recommendations:  Problem # 1:  BENIGN POSITIONAL VERTIGO (ICD-386.11)  Orders: Venipuncture (54098) TLB-BMP (Basic Metabolic Panel-BMET) (80048-METABOL) TLB-CBC Platelet - w/Differential (85025-CBCD) Physical Therapy Referral (PT) Prescription Created Electronically [phone])  Problem # 2:  OVARIAN CYST (ICD-620.2)  Bilateral, S/ P BSO  05/21/2010  Orders: Venipuncture (78295) TLB-BMP (Basic Metabolic Panel-BMET) (80048-METABOL) TLB-CBC Platelet - w/Differential (85025-CBCD)  Complete Medication List: 1)  Singulair 10 Mg Tabs (Montelukast sodium) .Marland Kitchen.. 1 once daily 2)  Symbicort 160-4.5 Mcg/act Aero (Budesonide-formoterol fumarate) .... 2 puffs q 12 hrs 3)  L-lysine Hcl 500 Mg Tabs (Lysine hcl) .... As needed 4)  Bayer Aspirin 325 Mg Tabs (Aspirin) .... As needed 5)  Combivent 103-18 Mcg/act Aero (Ipratropium-albuterol) .Marland Kitchen.. 1-2 puffs every 4 -6 hrs  as needed 6)  Spiriva Handihaler 18 Mcg Caps (Tiotropium bromide monohydrate) .... Inhale contents of a capsule once daily after symbicort 7)  Diazepam 2 Mg Tabs (Diazepam) .Marland Kitchen.. 1 every 8 -12 hrs as needed for the vertigo  Patient Instructions: 1)  Go to Web MD for Benign Positional Vertigo. Prescriptions: DIAZEPAM 2 MG TABS (DIAZEPAM) 1 every 8 -12 hrs as needed for the vertigo  #15 x 0   Entered and Authorized by:   Marga Melnick MD   Signed by:    Marga Melnick MD on 06/09/2010   Method used:   Printed then faxed to ...       [facility]* (retail)       [address]       [place], [place]  [id number]       Ph: [phone] or [phone]       Fax: [phone]   RxID:   [id number]    Orders Added: 1)  Est. Patient Level IV [29518] 2)  Venipuncture [84166] 3)  TLB-BMP (Basic Metabolic Panel-BMET) [80048-METABOL] 4)  TLB-CBC Platelet - w/Differential [85025-CBCD] 5)  Physical Therapy Referral [PT] 6)  Prescription Created Electronically [phone]

## 2010-08-19 NOTE — Progress Notes (Signed)
Summary: High K+  Phone Note From Other Clinic   Summary of Call: Lab called with critical K+ and will fax it to me. Initial call taken by: Lucious Groves CMA,  June 09, 2010 4:21 PM  Follow-up for Phone Call        Per MD, no citrus or K+ containing supplements.  Patient notified and will have repeated at [facility] in the AM. Lucious Groves CMA  June 09, 2010 4:31 PM

## 2010-08-19 NOTE — Letter (Signed)
Summary: [facility]  [facility]   Imported By: Lanelle Bal 05/07/2010 16:08:05  _____________________________________________________________________  External Attachment:    Type:   Image     Comment:   External Document

## 2010-08-19 NOTE — Letter (Signed)
Summary: [facility]  [facility]   Imported By: Lanelle Bal 06/10/2010 10:04:10  _____________________________________________________________________  External Attachment:    Type:   Image     Comment:   External Document

## 2010-08-21 NOTE — Letter (Signed)
Summary: [facility]  [facility]   Imported By: Lanelle Bal 07/16/2010 11:10:20  _____________________________________________________________________  External Attachment:    Type:   Image     Comment:   External Document

## 2010-08-21 NOTE — Letter (Signed)
Summary: [facility]nec[facility] Oncology  [facility]   Imported By: Lanelle Bal 07/16/2010 11:27:13  _____________________________________________________________________  External Attachment:    Type:   Image     Comment:   External Document

## 2010-09-15 ENCOUNTER — Ambulatory Visit (INDEPENDENT_AMBULATORY_CARE_PROVIDER_SITE_OTHER): Payer: Medicare Other | Admitting: Internal Medicine

## 2010-09-15 ENCOUNTER — Telehealth: Payer: Self-pay | Admitting: Internal Medicine

## 2010-09-15 ENCOUNTER — Encounter: Payer: Self-pay | Admitting: Internal Medicine

## 2010-09-15 DIAGNOSIS — J019 Acute sinusitis, unspecified: Secondary | ICD-10-CM | POA: Insufficient documentation

## 2010-09-15 DIAGNOSIS — J209 Acute bronchitis, unspecified: Secondary | ICD-10-CM

## 2010-09-25 NOTE — Assessment & Plan Note (Signed)
Summary: COUGH,CONGESTION/RH....   Vital Signs:  Patient profile:   70 year old female Weight:      133.0 pounds BMI:     22.91 Temp:     99.5 degrees F oral Pulse rate:   64 / minute Resp:     16 per minute BP sitting:   118 / 76  (left arm) Cuff size:   regular  Vitals Entered By: Shonna Chock CMA (September 15, 2010 11:50 AM) CC: Cough(productive and discolored) and congestion , URI symptoms   Primary Care Provider:  Marga Melnick, MD  CC:  Cough(productive and discolored) and congestion  and URI symptoms.  History of Present Illness:    Onset  2-3 days ago as head congestion after babysitting for sick  grandchild. She now is topical meds for "pink eye". She now reports nasal congestion, purulent nasal discharge, and scant  productive cough, but denies earache.  The patient denies fever, dyspnea, and wheezing.  The patient also reports frontal  headache.  Risk factors for Strep sinusitis include bilateral facial pain.  The patient denies the following risk factors for Strep sinusitis: tooth pain and tender adenopathy.   Rx: Neti pot, Mucinex  Current Medications (verified): 1)  Singulair 10 Mg  Tabs (Montelukast Sodium) .Marland Kitchen.. 1 Once Daily 2)  Symbicort 160-4.5 Mcg/act  Aero (Budesonide-Formoterol Fumarate) .... 2 Puffs Q 12 Hrs 3)  L-Lysine Hcl 500 Mg  Tabs (Lysine Hcl) .... As Needed 4)  Bayer Aspirin 325 Mg  Tabs (Aspirin) .... As Needed 5)  Combivent 103-18 Mcg/act Aero (Ipratropium-Albuterol) .Marland Kitchen.. 1-2 Puffs Every 4 -6 Hrs  As Needed 6)  Spiriva Handihaler 18 Mcg Caps (Tiotropium Bromide Monohydrate) .... Inhale Contents of A Capsule Once Daily After Symbicort 7)  Diazepam 2 Mg Tabs (Diazepam) .Marland Kitchen.. 1 Every 8 -12 Hrs As Needed For The Vertigo  Allergies: 1)  ! Prednisone 2)  ! * Tetanus  Physical Exam  General:  in no acute distress; alert,appropriate and cooperative throughout examination Eyes:  No corneal or conjunctival inflammation noted. EOMI. Arcus senilis. Vision  grossly normal. Ears:  External ear exam shows no significant lesions or deformities.  Otoscopic examination reveals clear canals, tympanic membranes are intact bilaterally without bulging, retraction, inflammation or discharge. Hearing is grossly normal bilaterally. Nose:  External nasal examination shows no deformity or inflammation. Nasal mucosa are pink and moist without lesions or exudates. septal dislocation Mouth:  Oral mucosa and oropharynx without lesions or exudates.  Teeth in good repair. Lungs:  Normal respiratory effort, chest expands symmetrically. Lungs are clear to auscultation, no crackles or wheezes. Cervical Nodes:  Minor  lymphadenopathy noted Axillary Nodes:  No palpable lymphadenopathy   Impression & Recommendations:  Problem # 1:  SINUSITIS- ACUTE-NOS (ICD-461.9)  Orders: Prescription Created Electronically [phone])  Her updated medication list for this problem includes:    Amoxicillin 500 Mg Caps (Amoxicillin) .Marland Kitchen... 1 three times a day    Fluticasone Propionate 50 Mcg/act Susp (Fluticasone propionate) .Marland Kitchen... 1 spray two times a day as needed  Problem # 2:  BRONCHITIS, ACUTE (ICD-466.0) mild w/o asthma flare Her updated medication list for this problem includes:    Singulair 10 Mg Tabs (Montelukast sodium) .Marland Kitchen... 1 once daily    Symbicort 160-4.5 Mcg/act Aero (Budesonide-formoterol fumarate) .Marland Kitchen... 2 puffs q 12 hrs    Combivent 103-18 Mcg/act Aero (Ipratropium-albuterol) .Marland Kitchen... 1-2 puffs every 4 -6 hrs  as needed    Spiriva Handihaler 18 Mcg Caps (Tiotropium bromide monohydrate) ..... Inhale contents of a  capsule once daily after symbicort    Amoxicillin 500 Mg Caps (Amoxicillin) .Marland Kitchen... 1 three times a day  Complete Medication List: 1)  Singulair 10 Mg Tabs (Montelukast sodium) .Marland Kitchen.. 1 once daily 2)  Symbicort 160-4.5 Mcg/act Aero (Budesonide-formoterol fumarate) .... 2 puffs q 12 hrs 3)  L-lysine Hcl 500 Mg Tabs (Lysine hcl) .... As needed 4)  Bayer Aspirin 325 Mg  Tabs (Aspirin) .... As needed 5)  Combivent 103-18 Mcg/act Aero (Ipratropium-albuterol) .Marland Kitchen.. 1-2 puffs every 4 -6 hrs  as needed 6)  Spiriva Handihaler 18 Mcg Caps (Tiotropium bromide monohydrate) .... Inhale contents of a capsule once daily after symbicort 7)  Diazepam 2 Mg Tabs (Diazepam) .Marland Kitchen.. 1 every 8 -12 hrs as needed for the vertigo 8)  Amoxicillin 500 Mg Caps (Amoxicillin) .Marland Kitchen.. 1 three times a day 9)  Fluticasone Propionate 50 Mcg/act Susp (Fluticasone propionate) .Marland Kitchen.. 1 spray two times a day as needed  Patient Instructions: 1)  Neti pot once daily - two times a day as needed . 2)  Drink as much NON dairy  fluid as you can tolerate for the next few days. Prescriptions: FLUTICASONE PROPIONATE 50 MCG/ACT SUSP (FLUTICASONE PROPIONATE) 1 spray two times a day as needed  #1 x 11   Entered and Authorized by:   Marga Melnick MD   Signed by:   Marga Melnick MD on 09/15/2010   Method used:   Electronically to        [facility]* (retail)       [address]       [place], [place]  [id number]       Ph: [phone] or [phone]       Fax: [phone]   RxID:   [id number] AMOXICILLIN 500 MG CAPS (AMOXICILLIN) 1 three times a day  #30 x 0   Entered and Authorized by:   Marga Melnick MD   Signed by:   Marga Melnick MD on 09/15/2010   Method used:   Electronically to        [facility]* (retail)       [address]       [place], [place]  [id number]       Ph: [phone] or [phone]       Fax: [phone]   RxID:   [id number]    Orders Added: 1)  Est. Patient Level III [28315] 2)  Prescription Created Electronically [phone]

## 2010-09-25 NOTE — Progress Notes (Signed)
Summary: PA--Symbicort  *Pending  Phone Note Other Incoming   Summary of Call: The office received paperwork stating that patient Symbicort will not covered on the plan. I called patient insurance company at [phone]. They will fax prior authorization paperwork. Lucious Groves CMA  September 15, 2010 9:21 AM   Follow-up for Phone Call        Form completed and pending MD completion. Lucious Groves CMA  September 15, 2010 11:45 AM   Additional Follow-up for Phone Call Additional follow up Details #1::        she will check with her paln as to alternatives Additional Follow-up by: Marga Melnick MD,  September 15, 2010 12:47 PM     Appended Document: PA--Symbicort  *Pending Paperwork completed and faxed, will await for insurance company reply.  Appended Document: PA--Symbicort  *Pending The office received paper stating that prior authorization was cancelled, prior authorization is not required at this time for Symbicort.

## 2010-09-25 NOTE — Medication Information (Signed)
Summary: Prior Authorization Not Required for Symbicort  Prior Authorization Not Required for Symbicort   Imported By: Maryln Gottron 09/19/2010 09:50:09  _____________________________________________________________________  External Attachment:    Type:   Image     Comment:   External Document

## 2010-09-30 LAB — URINALYSIS, ROUTINE W REFLEX MICROSCOPIC
Bilirubin Urine: NEGATIVE
Glucose, UA: NEGATIVE mg/dL
Ketones, ur: 15 mg/dL — AB
Protein, ur: NEGATIVE mg/dL
Urobilinogen, UA: 0.2 mg/dL (ref 0.0–1.0)

## 2010-09-30 LAB — URINE MICROSCOPIC-ADD ON

## 2010-10-07 ENCOUNTER — Telehealth: Payer: Self-pay | Admitting: *Deleted

## 2010-10-07 NOTE — Telephone Encounter (Signed)
Called pt left w/ husband to have pt return call- will inform medications not available in generic.

## 2010-10-08 NOTE — Telephone Encounter (Signed)
Left msg on machine for return call.  

## 2010-10-10 NOTE — Telephone Encounter (Signed)
Left w/ husband to have pt return call.

## 2010-10-13 NOTE — Telephone Encounter (Signed)
Will await pt's return call. 

## 2010-10-17 ENCOUNTER — Telehealth: Payer: Self-pay | Admitting: *Deleted

## 2010-10-17 MED ORDER — BUDESONIDE-FORMOTEROL FUMARATE 160-4.5 MCG/ACT IN AERO: 2.0000 | INHALATION_SPRAY | Freq: Two times a day (BID) | RESPIRATORY_TRACT | Status: DC

## 2010-10-17 MED ORDER — MONTELUKAST SODIUM 10 MG PO TABS: 10.0000 mg | ORAL_TABLET | Freq: Every day | ORAL | Status: DC

## 2010-10-17 NOTE — Telephone Encounter (Signed)
Pt aware rx sent to pharmacy.

## 2010-12-02 NOTE — Assessment & Plan Note (Signed)
[facility]                                 ON-CALL NOTE   NAME:Susan Chavez, Harries                           MRN:          [id number]  DATE:06/04/2007                            DOB:          05-25-1941    PRIMARY CARE PHYSICIAN:  Dr. Alwyn Ren.   The patient called at 8:42 a.m. on June 04, 2007, complaining of  breakout in her mouth.  [facility].     Lelon Perla, DO  Electronically Signed    Shawnie Dapper  DD: 06/04/2007  DT: 06/05/2007  Job #: [id number]

## 2010-12-08 ENCOUNTER — Emergency Department (HOSPITAL_COMMUNITY): Payer: Medicare Other

## 2010-12-08 ENCOUNTER — Emergency Department (HOSPITAL_COMMUNITY)
Admission: EM | Admit: 2010-12-08 | Discharge: 2010-12-08 | Discharge status: Against Medical Advice | Payer: Medicare Other | Attending: Emergency Medicine | Admitting: Emergency Medicine

## 2010-12-08 ENCOUNTER — Telehealth: Payer: Self-pay

## 2010-12-08 DIAGNOSIS — R5381 Other malaise: Secondary | ICD-10-CM | POA: Insufficient documentation

## 2010-12-08 DIAGNOSIS — R42 Dizziness and giddiness: Secondary | ICD-10-CM | POA: Insufficient documentation

## 2010-12-08 DIAGNOSIS — R51 Headache: Secondary | ICD-10-CM | POA: Insufficient documentation

## 2010-12-08 DIAGNOSIS — R5383 Other fatigue: Secondary | ICD-10-CM | POA: Insufficient documentation

## 2010-12-08 LAB — BASIC METABOLIC PANEL
BUN: 15 mg/dL (ref 6–23)
CO2: 31 mEq/L (ref 19–32)
Calcium: 10 mg/dL (ref 8.4–10.5)
Chloride: 101 mEq/L (ref 96–112)
Creatinine, Ser: 0.81 mg/dL (ref 0.4–1.2)
GFR calc Af Amer: >60 mL/min (ref >60)
GFR calc non Af Amer: >60 mL/min (ref >60)
Glucose, Bld: 104 mg/dL — ABNORMAL HIGH (ref 70–99)
Potassium: 3.8 mEq/L (ref 3.5–5.1)
Sodium: 140 mEq/L (ref 135–145)

## 2010-12-08 LAB — CBC
HCT: 39.9 % (ref 36.0–46.0)
MCHC: 33.8 g/dL (ref 30.0–36.0)
MCV: 92.1 fL (ref 78.0–100.0)
RDW: 13.3 % (ref 11.5–15.5)

## 2010-12-08 LAB — URINALYSIS, ROUTINE W REFLEX MICROSCOPIC
Bilirubin Urine: NEGATIVE
Glucose, UA: NEGATIVE mg/dL
Hgb urine dipstick: NEGATIVE
Ketones, ur: NEGATIVE mg/dL
Protein, ur: NEGATIVE mg/dL

## 2010-12-08 NOTE — Telephone Encounter (Signed)
Pt called says she was having numbness, tingling, dizziness, and looked up symptoms on internet and believes that she may be have signs of a stroke informed pt that with these symptoms that she needs to go to er sooner rather than later. Ask if she could just wait to later this afternoon to see Hop informed pt that there are no openings and for this see doesn't need to waste time in the office and needs to go to er. Pt says ok that she will go to [facility].

## 2010-12-10 ENCOUNTER — Encounter (INDEPENDENT_AMBULATORY_CARE_PROVIDER_SITE_OTHER): Payer: Medicare Other

## 2010-12-10 ENCOUNTER — Telehealth: Payer: Self-pay | Admitting: *Deleted

## 2010-12-10 DIAGNOSIS — R55 Syncope and collapse: Secondary | ICD-10-CM

## 2010-12-10 NOTE — Telephone Encounter (Signed)
Pt coming from [facility] for EKG per order dr love--EKG done--original to  pt for dr love--file copy put into system--nt

## 2010-12-11 ENCOUNTER — Encounter: Payer: Self-pay | Admitting: Internal Medicine

## 2010-12-11 ENCOUNTER — Ambulatory Visit
Admission: RE | Admit: 2010-12-11 | Discharge: 2010-12-11 | Disposition: A | Discharge status: Home / Self Care | Payer: Medicare Other | Source: Ambulatory Visit | Attending: Neurology | Admitting: Neurology

## 2010-12-11 ENCOUNTER — Ambulatory Visit (INDEPENDENT_AMBULATORY_CARE_PROVIDER_SITE_OTHER): Payer: Medicare Other | Admitting: Internal Medicine

## 2010-12-11 ENCOUNTER — Other Ambulatory Visit: Payer: Self-pay | Admitting: Neurology

## 2010-12-11 DIAGNOSIS — R42 Dizziness and giddiness: Secondary | ICD-10-CM

## 2010-12-11 DIAGNOSIS — H8110 Benign paroxysmal vertigo, unspecified ear: Secondary | ICD-10-CM

## 2010-12-11 DIAGNOSIS — Z8774 Personal history of (corrected) congenital malformations of heart and circulatory system: Secondary | ICD-10-CM | POA: Insufficient documentation

## 2010-12-11 DIAGNOSIS — Z9889 Other specified postprocedural states: Secondary | ICD-10-CM

## 2010-12-11 DIAGNOSIS — R011 Cardiac murmur, unspecified: Secondary | ICD-10-CM

## 2010-12-11 DIAGNOSIS — E785 Hyperlipidemia, unspecified: Secondary | ICD-10-CM

## 2010-12-11 MED ORDER — GADOBENATE DIMEGLUMINE 529 MG/ML IV SOLN
12.0000 mL | Freq: Once | INTRAVENOUS | Status: AC | PRN
  Administered 2010-12-11: 12 mL via INTRAVENOUS

## 2010-12-11 NOTE — Patient Instructions (Signed)
2-D echocardiogram will be ordered to assess the murmur if the MRI/MRA are negative. If the  symptoms persist referral will be made to physical therapy for  maneuvers to prevent benign positional vertigo symptoms.

## 2010-12-11 NOTE — Assessment & Plan Note (Signed)
NMR Lipoprofile 2005: LDL 194(1463/0), HDL 108, TG 264. LDL goal =  160.

## 2010-12-11 NOTE — Progress Notes (Signed)
  Subjective:    Patient ID: Susan Chavez, female    DOB: 1941/02/12, 70 y.o.   MRN: [id number]  HPI Dizziness Onset:12/06/2010 Context:acute onset getting out of bed Sat am Benign positional vertigo symptoms:no Straining:no Pain:no Cardiac prodrome: Palpitations, irregular rhythm, heart rate change:no Neurologic prodrome: headache, numbness and tingling, weakness, change in coordination (gait/falling):no Syncope:no Seizure activity:no Upper respiratory tract infection/extrinsic symptoms:no Duration:seconds Frequency:intermittently since 11/2010 Associated signs and symptoms:true vertigo when she lay down & turned to L Visual change (blurred/double/loss):no Hearing loss/tinnitus:no Nausea/sweating:no Chest pain:no Dyspnea:no Treatment/response:Diazepam helped She denies any slurring of speech. She describes mild depression;"not depths of despair"  ER records 05/ 21 were reviewed. Her blood pressure was 171/81. Glucose was 104. X-ray revealed some emphysematous chest changes without any acute change.  She states that she was in the emergency room for 6 hours and was not examined by the when the signs and nurse. She saw her cousin Dr. Sandria Manly, neurologist yesterday. He did extensive neurologic evaluation and ordered an MRI. He mentioned that she had a "slight heart irregularity".   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 She felt generalized weakness without any localized impairment.  She had sensation of pressure behind her eyes without frontal headaches or facial pain.     Objective:   Physical Exam   Gen. appearance: thin, but well-nourished, in no distress Eyes: Extraocular motion intact, field of vision normal, vision grossly intact, no nystagmus ENT: Canals clear, tympanic membranes normal, tuning fork exam normal, hearing grossly  normal Neck: Normal range of motion, no masses, normal thyroid Cardiovascular: Rate and rhythm normal; no murmurs, gallops . Click murmur @ apex suggested Muscle skeletal: Range of motion, tone, &  strength normal Neuro:no cranial nerve deficit, deep tendon  reflexes normal, gait (heel/toe) normal. Strength normal.She lay down & sat up w/o help   Lymph: No cervical or axillary LA Skin: Warm and dry without suspicious lesions or rashes Psych: no anxiety or mood change. Normally interactive and cooperative.  Extremities:  No cyanosis, edema, or deformity noted with good range of motion of all major joints.          Assessment & Plan:  She describes postural imbalance as initial symptom;subsequent symptoms sound like a recurrence of the benign positional vertigo  In the emergency room she was hypertensive; her blood pressure is now well controlled  A click murmur is suggested; I doubt that there is a cardiac etiology of these symptoms.  If the MRI is negative; 2D echocardiogram would be considered. If the benign positional vertigo symptoms recur; referral to physical therapy for interventional maneuvers would be pursued.

## 2010-12-18 ENCOUNTER — Encounter: Payer: Self-pay | Admitting: Internal Medicine

## 2010-12-22 ENCOUNTER — Encounter: Payer: Self-pay | Admitting: Internal Medicine

## 2010-12-26 ENCOUNTER — Other Ambulatory Visit: Payer: Self-pay | Admitting: *Deleted

## 2010-12-26 DIAGNOSIS — R55 Syncope and collapse: Secondary | ICD-10-CM

## 2011-02-27 ENCOUNTER — Ambulatory Visit (INDEPENDENT_AMBULATORY_CARE_PROVIDER_SITE_OTHER): Payer: Medicare Other | Admitting: Internal Medicine

## 2011-02-27 ENCOUNTER — Encounter: Payer: Self-pay | Admitting: Internal Medicine

## 2011-02-27 DIAGNOSIS — J45901 Unspecified asthma with (acute) exacerbation: Secondary | ICD-10-CM

## 2011-02-27 DIAGNOSIS — J209 Acute bronchitis, unspecified: Secondary | ICD-10-CM

## 2011-02-27 MED ORDER — PREDNISONE 20 MG PO TABS: 20.0000 mg | ORAL_TABLET | ORAL | Status: AC

## 2011-02-27 MED ORDER — AZITHROMYCIN 250 MG PO TABS: ORAL_TABLET | ORAL | Status: AC

## 2011-02-27 NOTE — Progress Notes (Signed)
  Subjective:    Patient ID: Susan Chavez, female    DOB: 04/28/1941, 70 y.o.   MRN: [id number]  HPI Wheezing: Onset:1 week ago after horse exposure with onset of wheezing Extrinsic symptoms:itchy eyes, sneezing:lid edema with itching  after equine exposure  Infectious symptoms :fever, purulent secretions :minimal discolored sputum Chest symptoms:no  pain, hemoptysis GI symptoms:no dyspepsia, reflux Occupational/environmental exposures:see above & poor air quality recently Smoking:quit in college ACE inhibitor:no Treatment/efficacy: Spiriva added to Symbicort  & Singulair Past medical history/family history pulmonary disease: asthma since early 95s; allergies as child    Review of Systems     Objective:   Physical Exam she is in no acute distress; and no increased work of breathing is present  Tympanic membranes are clear with no bulging or inflammation  Nares reveal no exudate or erythema. She does have slight septal dislocation  Oral pharynx reveals no erythema or exudate  She has an S4 with no significant murmur gallop  Chest is surprisingly clear without rales, rhonchi, or wheezing  She has no lymphadenopathy in the head, neck, axilla  Nails reveal no clubbing, edema, or cyanosis          Assessment & Plan:  #1 asthma; flare related to equine exposure and poor air quality. 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 She's been able to take a tetanus shot in the past  Plan: A rescue agent will be added on an every four-hour basis as needed.  A prescription for a short course of prednisone will be provided should she have acute issues. This would not be contraindicated as what she is experienced as an adverse effect of prednisone would be expected high-dose prolonged therapy.  Antibiotic will be prescribed as well.  A copy of this will be  given to her as she will be leaving town.

## 2011-02-27 NOTE — Patient Instructions (Signed)
Plain Mucinex for thick secretions ;force NON dairy fluids for next 48 hrs. Use a Neti pot daily as needed for sinus congestion 

## 2011-03-24 ENCOUNTER — Ambulatory Visit (INDEPENDENT_AMBULATORY_CARE_PROVIDER_SITE_OTHER): Payer: Medicare Other | Admitting: Internal Medicine

## 2011-03-24 DIAGNOSIS — H6091 Unspecified otitis externa, right ear: Secondary | ICD-10-CM

## 2011-03-24 DIAGNOSIS — J019 Acute sinusitis, unspecified: Secondary | ICD-10-CM

## 2011-03-24 DIAGNOSIS — H60399 Other infective otitis externa, unspecified ear: Secondary | ICD-10-CM

## 2011-03-24 MED ORDER — NEOMYCIN-POLYMYXIN-HC 3.5-10000-1 OT SUSP: 3.0000 [drp] | Freq: Four times a day (QID) | OTIC | Status: AC

## 2011-03-24 MED ORDER — AMOXICILLIN-POT CLAVULANATE 500-125 MG PO TABS: 1.0000 | ORAL_TABLET | Freq: Two times a day (BID) | ORAL | Status: AC

## 2011-03-24 NOTE — Patient Instructions (Signed)
Plain Mucinex for thick secretions ;force NON dairy fluids for next 48 hrs. Use a Neti pot daily as needed for sinus congestion  Up to twice a day

## 2011-03-24 NOTE — Progress Notes (Signed)
  Subjective:    Patient ID: Susan Chavez, female    DOB: Apr 12, 1941, 70 y.o.   MRN: [id number]  HPI Respiratory tract infection Onset/symptoms:9/1 as head congestion Exposures (illness/environmental/extrinsic):no Progression of symptoms:increased ear pain Treatments/response:saline, Neti pot Present symptoms:#1 = ear ache Fever/chills/sweats:temp to 100 on 9/2 Frontal headache:no Facial pain:no Nasal purulence:yes Sore throat:yes Dental pain:no Lymphadenopathy:no Wheezing/shortness of breath:no Cough/sputum/hemoptysis:scant foamy sputum Associated extrinsic/allergic symptoms:itchy eyes/ sneezing:no Past medical history: Seasonal allergies:yes/asthma:yes Smoking history:quit in college           Review of Systems     Objective:   Physical Exam General appearance; thin but in  good health ; no acute distress or increased work of breathing is present.  No  lymphadenopathy about the head, neck, or axilla noted.   Eyes: No conjunctival inflammation or lid edema is present.   Ears:  External ear exam shows no significant lesions or deformities.  Otoscopic examination reveals clear canals, L tympanic membrane intact  without bulging, retraction, inflammation or discharge. R TM with mild erythema  Nose:  External nasal examination shows no deformity or inflammation. Nasal mucosa are pink and moist without lesions or exudates. Slight septal dislocation .No obstruction to airflow.   Oral exam: Dental hygiene is good; lips and gums are healthy appearing.There is no oropharyngeal erythema or exudate noted.     Heart:  Normal rate and regular rhythm. S1 and S2 normal without gallop,  click, rub or other extra sounds. Grade 1/2-1 raspy systolic murmur  Lungs:Chest clear to auscultation; no wheezes, rhonchi,rales ,or rubs present.No increased work of breathing.    Extremities:  No cyanosis, edema, or clubbing  noted    Skin: Warm & dry          Assessment & Plan:  #1  sinusitis  #2 otitis, right  Plan: see orders and recommendations.

## 2011-06-15 ENCOUNTER — Ambulatory Visit (INDEPENDENT_AMBULATORY_CARE_PROVIDER_SITE_OTHER): Payer: Medicare Other

## 2011-06-15 DIAGNOSIS — Z23 Encounter for immunization: Secondary | ICD-10-CM

## 2011-06-19 ENCOUNTER — Ambulatory Visit (INDEPENDENT_AMBULATORY_CARE_PROVIDER_SITE_OTHER): Payer: Medicare Other | Admitting: Internal Medicine

## 2011-06-19 ENCOUNTER — Encounter: Payer: Self-pay | Admitting: Internal Medicine

## 2011-06-19 VITALS — BP 120/80 | HR 67 | Temp 98.4°F | Wt 135.4 lb

## 2011-06-19 DIAGNOSIS — J069 Acute upper respiratory infection, unspecified: Secondary | ICD-10-CM

## 2011-06-19 MED ORDER — AMOXICILLIN 500 MG PO CAPS: 1000.0000 mg | ORAL_CAPSULE | Freq: Two times a day (BID) | ORAL | Status: AC

## 2011-06-19 NOTE — Patient Instructions (Signed)
Rest, fluids , tylenol For cough, take Mucinex DM twice a day as needed  For congestion use Dymista 2 sprays on each side of the nose daily until samples gone  Only if no better next week , use Amoxicillin Call if no better in 10 days Call anytime if the symptoms are severe

## 2011-06-19 NOTE — Progress Notes (Signed)
  Subjective:    Patient ID: Susan Chavez, female    DOB: 1940-09-26, 70 y.o.   MRN: [id number]  HPI Symptoms is that a few days ago: Postnasal dripping, brown nasal discharge, sore throat. Mild chest congestion without increasing increasing.  Past Medical History  Diagnosis Date  . 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 Colon polyp 2003  . Hyperlipidemia   . Asthma     Review of Systems No fever or chills No itchy eyes or itchy nose. Good compliance with Symbicort and Singulair, does not use albuterol due to an allergy to it , she uses spiriva from time to time.    Objective:   Physical Exam  Constitutional: She appears well-developed and well-nourished.  HENT:  Head: Normocephalic and atraumatic.  Right Ear: External ear normal.  Left Ear: External ear normal.       Face symmetric an NTTP  Cardiovascular: Normal rate, regular rhythm and normal heart sounds.   No murmur heard. Pulmonary/Chest: Breath sounds normal. No respiratory distress. She has no wheezes. She has no rales.       Assessment & Plan:  URI, history of asthma: Symptoms consistent with a URI, on looking at her chart she got 2 rounds of antibiotic recently. See instructions, I recommend her not to take antibiotics if she is better, risk of diarrhea and resistance discussed

## 2011-12-03 ENCOUNTER — Other Ambulatory Visit: Payer: Self-pay | Admitting: Internal Medicine

## 2011-12-09 ENCOUNTER — Other Ambulatory Visit: Payer: Self-pay | Admitting: Internal Medicine

## 2011-12-30 ENCOUNTER — Encounter: Payer: Self-pay | Admitting: Internal Medicine

## 2012-01-01 ENCOUNTER — Encounter: Payer: Self-pay | Admitting: Internal Medicine

## 2012-01-04 ENCOUNTER — Encounter: Payer: Self-pay | Admitting: Internal Medicine

## 2012-04-28 ENCOUNTER — Encounter: Payer: Self-pay | Admitting: Internal Medicine

## 2012-04-28 ENCOUNTER — Ambulatory Visit (INDEPENDENT_AMBULATORY_CARE_PROVIDER_SITE_OTHER)
Admission: RE | Admit: 2012-04-28 | Discharge: 2012-04-28 | Disposition: A | Discharge status: Home / Self Care | Payer: Medicare Other | Source: Ambulatory Visit | Attending: Internal Medicine | Admitting: Internal Medicine

## 2012-04-28 ENCOUNTER — Ambulatory Visit (INDEPENDENT_AMBULATORY_CARE_PROVIDER_SITE_OTHER): Payer: Medicare Other | Admitting: Internal Medicine

## 2012-04-28 VITALS — BP 126/78 | HR 67 | Temp 98.5°F | Wt 134.0 lb

## 2012-04-28 DIAGNOSIS — R071 Chest pain on breathing: Secondary | ICD-10-CM

## 2012-04-28 DIAGNOSIS — J209 Acute bronchitis, unspecified: Secondary | ICD-10-CM

## 2012-04-28 DIAGNOSIS — R0781 Pleurodynia: Secondary | ICD-10-CM

## 2012-04-28 MED ORDER — AZITHROMYCIN 250 MG PO TABS: ORAL_TABLET | ORAL | Status: DC

## 2012-04-28 NOTE — Progress Notes (Signed)
  Subjective:    Patient ID: Susan Chavez, female    DOB: [birth year], 71 y.o.   MRN: [id number]  HPI Symptoms began 2-3 weeks ago and a sore throat and postnasal drainage. She used nasal lavage to treat the symptoms. She questioned allergy but she denies significant extrinsic symptoms such as itchy, watery eyes, or sneezing.  As of 10/7 she began to have right-sided pleuritic chest pain. She has had cough with thick clear sputum. She's had wheezing which is essentially stable.   Review of Systems She denies fever, chills, or sweats. She had frontal headache only on one day. Otherwise she denies frontal headache, facial pain, or nasal purulence.     Objective:   Physical Exam General appearance: thin but in good health ;well nourished; no acute distress or increased work of breathing is present.  No  lymphadenopathy about the head, neck, or axilla noted.   Eyes: No conjunctival inflammation or lid edema is present.   Ears:  External ear exam shows no significant lesions or deformities.  Otoscopic examination reveals clear canals, tympanic membranes are intact bilaterally without bulging, retraction, inflammation or discharge.  Nose:  External nasal examination shows no deformity or inflammation. Nasal mucosa are pink and moist without lesions or exudates. No septal dislocation or deviation.No obstruction to airflow.   Oral exam: Dental hygiene is good; lips and gums are healthy appearing.There is no oropharyngeal erythema or exudate noted.   Neck:  No deformities, masses, or tenderness noted.     Heart:  Normal rate and regular rhythm. S1 and S2 normal without gallop, murmur, click, rub or other extra sounds.   Lungs:Chest clear to auscultation; NO wheezes, rhonchi,rales ,or RUB present.No increased work of breathing.  Some bronchovesicular quality to breath sounds in the right posterior thorax  Extremities:  No cyanosis, edema, or clubbing  noted    Skin: Warm & dry w/o jaundice or  tenting.          Assessment & Plan:  #1 acute bronchitis w/o bronchospasm but abnormal breath sounds & pleuritic chest pain Plan: See orders and recommendations

## 2012-04-28 NOTE — Patient Instructions (Addendum)
Order for x-rays entered into  the computer; these will be performed at [address] [facility]. across from [facility]. No appointment is necessary. Take Aleve one to 2 every 8-12 hours with food as needed. Do not take aspirin or ibuprofen with this.

## 2012-04-29 ENCOUNTER — Encounter: Payer: Self-pay | Admitting: *Deleted

## 2012-04-29 LAB — CBC WITH DIFFERENTIAL/PLATELET
Basophils Relative: 0.4 % (ref 0.0–3.0)
Eosinophils Relative: 2.1 % (ref 0.0–5.0)
Lymphocytes Relative: 30.2 % (ref 12.0–46.0)
MCV: 94.3 fl (ref 78.0–100.0)
Monocytes Absolute: 0.3 10*3/uL (ref 0.1–1.0)
Neutrophils Relative %: 63 % (ref 43.0–77.0)
Platelets: 254 10*3/uL (ref 150.0–400.0)
RBC: 4.38 Mil/uL (ref 3.87–5.11)
WBC: 6.8 10*3/uL (ref 4.5–10.5)

## 2012-05-20 ENCOUNTER — Ambulatory Visit (INDEPENDENT_AMBULATORY_CARE_PROVIDER_SITE_OTHER): Payer: Medicare Other

## 2012-05-20 DIAGNOSIS — Z23 Encounter for immunization: Secondary | ICD-10-CM

## 2012-06-15 ENCOUNTER — Other Ambulatory Visit: Payer: Self-pay | Admitting: Internal Medicine

## 2012-08-23 ENCOUNTER — Telehealth: Payer: Self-pay | Admitting: Internal Medicine

## 2012-08-23 MED ORDER — ESCITALOPRAM OXALATE 10 MG PO TABS: ORAL_TABLET | ORAL | Status: DC

## 2012-08-23 NOTE — Telephone Encounter (Signed)
Patient Information:  Caller Name: Aaima Gaddie  Phone: [phone]  Patient: Susan, Chavez  Gender: Female  DOB: Nov 04, 1940  Age: 72 Years  PCP: Marga Melnick  Office Follow Up:  Does the office need to follow up with this patient?: Yes  Instructions For The Office: Please respond to caller request for Rx refill.  She would like notification either way.  Pharmacy information confirmed in Epic.   Symptoms  Reason For Call & Symptoms: Patient calling.  Requests refill on Lexapro/ escitalopram 10 mg. States she has difficulty getting to office due to dealing with her spouse's illness and her fractured right arm.  Larey Seat and fractured arm Jun 25, 2012; in a sling.   Medical record indicates Rx  from historical provider, but patient states Dr. Alwyn Ren ordered it for her previously.  Reviewed Health History In EMR: Yes  Reviewed Medications In EMR: Yes  Reviewed Allergies In EMR: Yes  Reviewed Surgeries / Procedures: Yes  Date of Onset of Symptoms: Unknown  Guideline(s) Used:  No Protocol Available - Information Only  Disposition Per Guideline:   Discuss with PCP and Callback by Nurse Today  Reason For Disposition Reached:   Nursing judgment  Advice Given:  N/A

## 2012-08-23 NOTE — Telephone Encounter (Signed)
RX sent, patient informed. Patient aware to contact the pharmacy in the future for refill request

## 2012-10-12 ENCOUNTER — Other Ambulatory Visit: Payer: Self-pay | Admitting: Internal Medicine

## 2012-12-05 ENCOUNTER — Encounter: Payer: Self-pay | Admitting: Internal Medicine

## 2012-12-22 ENCOUNTER — Other Ambulatory Visit: Payer: Self-pay | Admitting: Internal Medicine

## 2013-01-16 ENCOUNTER — Other Ambulatory Visit: Payer: Self-pay | Admitting: Internal Medicine

## 2013-01-16 NOTE — Telephone Encounter (Signed)
Refill done.  

## 2013-02-08 ENCOUNTER — Other Ambulatory Visit: Payer: Self-pay | Admitting: Internal Medicine

## 2013-04-12 ENCOUNTER — Ambulatory Visit (INDEPENDENT_AMBULATORY_CARE_PROVIDER_SITE_OTHER): Payer: Medicare Other | Admitting: Internal Medicine

## 2013-04-12 ENCOUNTER — Encounter: Payer: Self-pay | Admitting: Internal Medicine

## 2013-04-12 VITALS — BP 123/64 | HR 59 | Temp 98.6°F | Wt 140.4 lb

## 2013-04-12 DIAGNOSIS — Z23 Encounter for immunization: Secondary | ICD-10-CM

## 2013-04-12 DIAGNOSIS — J45909 Unspecified asthma, uncomplicated: Secondary | ICD-10-CM

## 2013-04-12 MED ORDER — ALBUTEROL SULFATE HFA 108 (90 BASE) MCG/ACT IN AERS: 2.0000 | INHALATION_SPRAY | RESPIRATORY_TRACT | Status: DC | PRN

## 2013-04-12 MED ORDER — AZITHROMYCIN 250 MG PO TABS: ORAL_TABLET | ORAL | Status: DC

## 2013-04-12 MED ORDER — ESCITALOPRAM OXALATE 10 MG PO TABS: ORAL_TABLET | ORAL | Status: DC

## 2013-04-12 MED ORDER — METHYLPREDNISOLONE 4 MG PO TABS: ORAL_TABLET | ORAL | Status: DC

## 2013-04-12 NOTE — Addendum Note (Signed)
Addended by: Verdie Shire on: 04/12/2013 04:30 PM   Modules accepted: Orders

## 2013-04-12 NOTE — Patient Instructions (Addendum)
Fill the Medrol if no better in 36-48 hours.

## 2013-04-12 NOTE — Assessment & Plan Note (Signed)
Zpack & Medrol if needed

## 2013-04-12 NOTE — Progress Notes (Signed)
  Subjective:    Patient ID: Susan Chavez, female    DOB: March 24, 1941, 72 y.o.   MRN: [id number]  HPI  She's had a flareup of her asthma recently without specific trigger. This is been associated with cough which is typically productive of voluminous clear secretions. This is also been associated with some postnasal drainage resulting in some sore throat. She has discomfort of the mandibular teeth and in the forehead. She has been using her maintenance Symbicort and Singulair    Review of Systems  She denies associated fever, chills, or sweats.  She has not had significant extrinsic symptoms of itchy, watery eyes, or sneezing.  She also denies facial pain, otic pain or otic discharge.     Objective:   Physical Exam General appearance:thin but good health ;well nourished; no acute distress or increased work of breathing is present.  No  lymphadenopathy about the head, neck, or axilla noted.   Eyes: No conjunctival inflammation or lid edema is present. Arcus.  Ears:  External ear exam shows no significant lesions or deformities.  Otoscopic examination reveals clear canals, tympanic membranes are intact bilaterally without bulging, retraction, inflammation or discharge.  Nose:  External nasal examination shows no deformity or inflammation. Nasal mucosa are pink and moist without lesions or exudates. No septal dislocation or deviation.No obstruction to airflow.   Oral exam: Dental hygiene is good; lips and gums are healthy appearing.There is no oropharyngeal erythema or exudate noted.   Neck:  No deformities,  masses, or tenderness noted.     Heart:  Normal rate and regular rhythm. S1 and S2 normal without gallop, murmur, click, rub or other extra sounds.   Lungs:No wheezes or rhonchi; mild rales  present.No increased work of breathing.    Extremities:  No cyanosis, edema, or clubbing  noted    Skin: Warm & dry w/o jaundice.         Assessment & Plan:

## 2013-05-25 ENCOUNTER — Other Ambulatory Visit: Payer: Self-pay | Admitting: Internal Medicine

## 2013-05-25 ENCOUNTER — Encounter: Payer: Self-pay | Admitting: Internal Medicine

## 2013-05-25 ENCOUNTER — Ambulatory Visit (INDEPENDENT_AMBULATORY_CARE_PROVIDER_SITE_OTHER): Payer: Medicare Other | Admitting: Internal Medicine

## 2013-05-25 VITALS — BP 136/78 | HR 59 | Temp 98.2°F | Wt 139.0 lb

## 2013-05-25 DIAGNOSIS — J441 Chronic obstructive pulmonary disease with (acute) exacerbation: Secondary | ICD-10-CM

## 2013-05-25 MED ORDER — METHYLPREDNISOLONE 4 MG PO TABS: 4.0000 mg | ORAL_TABLET | Freq: Every day | ORAL | Status: DC

## 2013-05-25 MED ORDER — HYDROCODONE-HOMATROPINE 5-1.5 MG/5ML PO SYRP: 5.0000 mL | ORAL_SOLUTION | Freq: Four times a day (QID) | ORAL | Status: DC | PRN

## 2013-05-25 MED ORDER — AZITHROMYCIN 250 MG PO TABS: ORAL_TABLET | ORAL | Status: DC

## 2013-05-25 NOTE — Addendum Note (Signed)
Addended by: Eustace Quail on: 05/25/2013 04:08 PM   Modules accepted: Orders

## 2013-05-25 NOTE — Addendum Note (Signed)
Addended by: Eustace Quail on: 05/25/2013 04:11 PM   Modules accepted: Orders

## 2013-05-25 NOTE — Progress Notes (Signed)
  Subjective:    Patient ID: Susan Chavez, female    DOB: Jun 16, 1941, 72 y.o.   MRN: [id number]  HPI  Asthma symptoms persist as cough with clear- white sputum, wheezing & shortness of breath .Cough is especially bad  at night with paroxysmal nocturnal dyspnea; she's been propping up on 2 pillows. This is despite using albuterol 2 puffs 2 times a day; Symbicort 2 puffs twice a day; and Singulair.  She has had some postnasal drip with associated sore throat.    Review of Systems  She does not have significant extrinsic symptoms of itchy, watery eyes, sneezing, or angioedema.  She also denies rhinosinusitis symptoms of viral headache, facial pain, nasal purulence, otic pain, or otic discharge.  She is not having fever or chills. She had some unrelated sweats.  She has no chest pain or hemoptysis  She also denies any significant reflux symptoms.    Objective:   Physical Exam General appearance: thin but in good health ;well nourished; no acute distress or increased work of breathing is present.  No  lymphadenopathy about the head, neck, or axilla noted.   Eyes: No conjunctival inflammation or lid edema is present.  Ears:  External ear exam shows no significant lesions or deformities.  Otoscopic examination reveals clear canals, tympanic membranes are intact bilaterally without bulging, retraction, inflammation or discharge.  Nose:  External nasal examination shows no deformity or inflammation. Nasal mucosa are pink and moist without lesions or exudates. Slight  septal dislocation .No obstruction to airflow.   Oral exam: Dental hygiene is good; lips and gums are healthy appearing.There is no oropharyngeal erythema or exudate noted.   Neck:  No deformities,  masses, or tenderness noted.     Heart:  Normal rate and regular rhythm. S1 and S2 normal without gallop, murmur, click, rub or other extra sounds. S4  Lungs: She has paroxysmal which is nonproductive. Symmetrically she has  diffuse low-grade rales without significant wheezing or rhonchi  Extremities:  No cyanosis, edema, or clubbing  noted . Homans sign negative bilaterally   Skin: Warm & dry .         Assessment & Plan:  #1 asthma, persistent  Plan: See orders

## 2013-05-25 NOTE — Patient Instructions (Addendum)
Plain Mucinex (NOT D) for thick secretions ;force NON dairy fluids .   Nasal cleansing in the shower as discussed with lather of mild shampoo.After 10 seconds wash off lather while  exhaling through nostrils. Make sure that all residual soap is removed to prevent irritation.  Nasacort AQ OTC 1 spray in each nostril twice a day as needed. Use the "crossover" technique into opposite nostril spraying toward opposite ear @ 45 degree angle, not straight up into nostril.  Use a Neti pot daily only  as needed for significant sinus congestion; going from open side to congested side . Plain Allegra (NOT D )  160 daily , Loratidine 10 mg , OR Zyrtec 10 mg @ bedtime  as needed for itchy eyes & sneezing. Trial of Advair 500/50 twice a day in place of Symbicort. Order for x-rays entered into  the computer; these will be performed at [address] [facility]. across from [facility]. No appointment is necessary.

## 2013-05-25 NOTE — Telephone Encounter (Signed)
Montelukast refill sent to pharmacy 

## 2013-05-26 ENCOUNTER — Ambulatory Visit (INDEPENDENT_AMBULATORY_CARE_PROVIDER_SITE_OTHER)
Admission: RE | Admit: 2013-05-26 | Discharge: 2013-05-26 | Disposition: A | Discharge status: Home / Self Care | Payer: Medicare Other | Source: Ambulatory Visit | Attending: Internal Medicine | Admitting: Internal Medicine

## 2013-05-26 DIAGNOSIS — J441 Chronic obstructive pulmonary disease with (acute) exacerbation: Secondary | ICD-10-CM

## 2013-05-26 LAB — CBC WITH DIFFERENTIAL/PLATELET
Basophils Relative: 1.1 % (ref 0.0–3.0)
Eosinophils Relative: 8.5 % — ABNORMAL HIGH (ref 0.0–5.0)
Hemoglobin: 13.3 g/dL (ref 12.0–15.0)
Lymphocytes Relative: 27.1 % (ref 12.0–46.0)
MCHC: 33.4 g/dL (ref 30.0–36.0)
MCV: 92.8 fl (ref 78.0–100.0)
Monocytes Absolute: 0.4 10*3/uL (ref 0.1–1.0)
Neutro Abs: 4.1 10*3/uL (ref 1.4–7.7)
Neutrophils Relative %: 58.2 % (ref 43.0–77.0)
RBC: 4.31 Mil/uL (ref 3.87–5.11)
WBC: 7.1 10*3/uL (ref 4.5–10.5)

## 2013-06-23 ENCOUNTER — Other Ambulatory Visit: Payer: Self-pay | Admitting: Internal Medicine

## 2013-06-23 ENCOUNTER — Telehealth: Payer: Self-pay | Admitting: Internal Medicine

## 2013-06-23 DIAGNOSIS — J45909 Unspecified asthma, uncomplicated: Secondary | ICD-10-CM

## 2013-06-23 NOTE — Telephone Encounter (Signed)
Patient states that she wants to see a Pulmonary doctor because her coughing is not improving. She has self-referred and made her own appointment with Pulmonary and was given an appointment on 08/01/2013, however, she would like to know if our office could get her in sooner. Please advise.

## 2013-06-23 NOTE — Telephone Encounter (Signed)
Please advise.//AB/CMA 

## 2013-07-28 ENCOUNTER — Telehealth: Payer: Self-pay | Admitting: *Deleted

## 2013-07-28 ENCOUNTER — Encounter: Payer: Self-pay | Admitting: Internal Medicine

## 2013-07-28 ENCOUNTER — Other Ambulatory Visit (INDEPENDENT_AMBULATORY_CARE_PROVIDER_SITE_OTHER): Payer: Medicare Other

## 2013-07-28 ENCOUNTER — Ambulatory Visit (INDEPENDENT_AMBULATORY_CARE_PROVIDER_SITE_OTHER): Payer: Medicare Other | Admitting: Internal Medicine

## 2013-07-28 ENCOUNTER — Other Ambulatory Visit: Payer: Self-pay | Admitting: Internal Medicine

## 2013-07-28 ENCOUNTER — Ambulatory Visit (INDEPENDENT_AMBULATORY_CARE_PROVIDER_SITE_OTHER)
Admission: RE | Admit: 2013-07-28 | Discharge: 2013-07-28 | Disposition: A | Discharge status: Home / Self Care | Payer: Medicare Other | Source: Ambulatory Visit | Attending: Internal Medicine | Admitting: Internal Medicine

## 2013-07-28 VITALS — BP 167/76 | HR 62 | Temp 98.3°F | Wt 139.4 lb

## 2013-07-28 DIAGNOSIS — R05 Cough: Secondary | ICD-10-CM

## 2013-07-28 DIAGNOSIS — J45901 Unspecified asthma with (acute) exacerbation: Principal | ICD-10-CM

## 2013-07-28 DIAGNOSIS — J441 Chronic obstructive pulmonary disease with (acute) exacerbation: Secondary | ICD-10-CM

## 2013-07-28 DIAGNOSIS — J4551 Severe persistent asthma with (acute) exacerbation: Secondary | ICD-10-CM

## 2013-07-28 DIAGNOSIS — K219 Gastro-esophageal reflux disease without esophagitis: Secondary | ICD-10-CM

## 2013-07-28 DIAGNOSIS — R059 Cough, unspecified: Secondary | ICD-10-CM

## 2013-07-28 LAB — CBC WITH DIFFERENTIAL/PLATELET
BASOS PCT: 0.6 % (ref 0.0–3.0)
Basophils Absolute: 0 10*3/uL (ref 0.0–0.1)
Eosinophils Absolute: 1.3 10*3/uL — ABNORMAL HIGH (ref 0.0–0.7)
HCT: 37 % (ref 36.0–46.0)
HEMOGLOBIN: 12.6 g/dL (ref 12.0–15.0)
Lymphocytes Relative: 29.1 % (ref 12.0–46.0)
Lymphs Abs: 2.1 10*3/uL (ref 0.7–4.0)
MCHC: 34.2 g/dL (ref 30.0–36.0)
MCV: 92.2 fl (ref 78.0–100.0)
MONOS PCT: 5.8 % (ref 3.0–12.0)
Monocytes Absolute: 0.4 10*3/uL (ref 0.1–1.0)
NEUTROS ABS: 3.3 10*3/uL (ref 1.4–7.7)
NEUTROS PCT: 46.6 % (ref 43.0–77.0)
Platelets: 224 10*3/uL (ref 150.0–400.0)
RBC: 4.01 Mil/uL (ref 3.87–5.11)
RDW: 13.3 % (ref 11.5–14.6)
WBC: 7.1 10*3/uL (ref 4.5–10.5)

## 2013-07-28 MED ORDER — HYDROCODONE-HOMATROPINE 5-1.5 MG/5ML PO SYRP: 5.0000 mL | ORAL_SOLUTION | Freq: Four times a day (QID) | ORAL | Status: DC | PRN

## 2013-07-28 MED ORDER — PREDNISONE 20 MG PO TABS: 20.0000 mg | ORAL_TABLET | Freq: Two times a day (BID) | ORAL | Status: DC

## 2013-07-28 MED ORDER — ESOMEPRAZOLE MAGNESIUM 20 MG PO CPDR: DELAYED_RELEASE_CAPSULE | ORAL | Status: DC

## 2013-07-28 NOTE — Progress Notes (Signed)
Pre visit review using our clinic review tool, if applicable. No additional management support is needed unless otherwise documented below in the visit note. 

## 2013-07-28 NOTE — Telephone Encounter (Signed)
Patient called and stated that her asthma has been bothering her and a hard time breathing. Also she stated that she has this coughing episode that lasts for an hour at times.  Patient wanted to see if she could be worked in or if dr hopper could call her something in.

## 2013-07-28 NOTE — Telephone Encounter (Signed)
Order for x-rays entered into  the computer; these will be performed at [place]. across from [place].See me @ 2:30

## 2013-07-28 NOTE — Telephone Encounter (Signed)
Please advise.//AB/CMA 

## 2013-07-28 NOTE — Progress Notes (Signed)
   Subjective:    Patient ID: Susan Chavez, female    DOB: [birth year], 73 y.o.   MRN: [id number]  HPI She has severe paroxysms of cough which has persisted over the last several months. She continues to use her Symbicort 2 puffs twice a day. She is only using albuterol up to twice a day.  The paroxysms of cough can last up to an hour. The sputum was described as clear and foamy. All nasal secretions are clear as well.  Cold air induced an episode of chest discomfort.  Chest x-ray performed today was reviewed with her. It does show some interstitial markings increase particularly in the right perihilar area. There is no infiltrate.     Review of Systems   She does have postnasal drainage despite use inhaled steroid. She denies itchy, watery eyes, or sneezing.  She has not had associated fever, chills, or sweats except sweating with the paroxysms of cough. She has no frontal headache, facial pain, nasal purulence, otic pain, otic discharge.  She is unaware of any significant reflux symptoms but she has been ingesting menthol drops throughout the day.  She drinks 2 glasses tea & 4 cups coffee or more / day.    Objective:   Physical Exam General appearance: thin but in good health ;well nourished; no acute distress or increased work of breathing is present.  No  lymphadenopathy about the head, neck, or axilla noted.   Eyes: No conjunctival inflammation or lid edema is present.   Ears:  External ear exam shows no significant lesions or deformities.  Otoscopic examination reveals clear canals, tympanic membranes are intact bilaterally without bulging, retraction, inflammation or discharge.  Nose:  External nasal examination shows no deformity or inflammation. Nasal mucosa are pink and moist without lesions or exudates. No septal dislocation or deviation.No obstruction to airflow.   Oral exam: Dental hygiene is good; lips and gums are healthy appearing.There is no oropharyngeal erythema or  exudate noted.   Neck:  No deformities,  masses, or tenderness noted.      Heart:  Normal rate and regular rhythm. S1 and S2 normal without gallop,  rub or other extra sounds.  murmur cannot be renewed because of the abnormal breath sounds.   Lungs:Diffuse symmetric musical wheezing/rhonchi in all lung fields Extremities:  No cyanosis, edema, or clubbing  noted    Skin: Warm & damp.         Assessment & Plan:  See Current Assessment & Plan in Problem List under specific Diagnosis

## 2013-07-28 NOTE — Patient Instructions (Signed)
Reflux of gastric acid may be asymptomatic as this may occur mainly during sleep.The triggers for reflux  include stress; the "aspirin family" ; alcohol; peppermint; and caffeine (coffee, tea, cola, and chocolate). The aspirin family would include aspirin and the nonsteroidal agents such as ibuprofen &  Naproxen. Tylenol would not cause reflux. If having symptoms ; food & drink should be avoided for @ least 2 hours before going to bed.   Albuterol is a rescue inhaler which should be used as infrequently as possible; it should never be used more than 1-2 puffs every 4 hours. It may  be used 15-30 minutes before exercise if that is also  a trigger for your asthma.   If it is required more than 2-3 times per week; the asthma is not well controlled. Symbicort is a maintenance agent to control smooth muscle spasm and airway inflammation  and to prevent adverse effects from excess albuterol use.Those adverse effects can include health or life threatening heart rhythm irregularities. Marland Kitchen

## 2013-08-01 ENCOUNTER — Other Ambulatory Visit: Payer: Medicare Other

## 2013-08-01 ENCOUNTER — Encounter: Payer: Self-pay | Admitting: Internal Medicine

## 2013-08-01 ENCOUNTER — Ambulatory Visit (INDEPENDENT_AMBULATORY_CARE_PROVIDER_SITE_OTHER): Payer: Medicare Other | Admitting: Internal Medicine

## 2013-08-01 VITALS — BP 116/72 | HR 57 | Ht 64.0 in | Wt 139.4 lb

## 2013-08-01 DIAGNOSIS — J45909 Unspecified asthma, uncomplicated: Secondary | ICD-10-CM

## 2013-08-01 DIAGNOSIS — J309 Allergic rhinitis, unspecified: Secondary | ICD-10-CM

## 2013-08-01 MED ORDER — LEVALBUTEROL HCL 0.63 MG/3ML IN NEBU
0.6300 mg | INHALATION_SOLUTION | Freq: Once | RESPIRATORY_TRACT | Status: AC
  Administered 2013-08-01: 0.63 mg via RESPIRATORY_TRACT

## 2013-08-01 MED ORDER — ALBUTEROL SULFATE HFA 108 (90 BASE) MCG/ACT IN AERS: 2.0000 | INHALATION_SPRAY | RESPIRATORY_TRACT | Status: DC | PRN

## 2013-08-01 MED ORDER — MOMETASONE FURO-FORMOTEROL FUM 200-5 MCG/ACT IN AERO: INHALATION_SPRAY | RESPIRATORY_TRACT | Status: DC

## 2013-08-01 MED ORDER — METHYLPREDNISOLONE ACETATE 80 MG/ML IJ SUSP
80.0000 mg | Freq: Once | INTRAMUSCULAR | Status: AC
  Administered 2013-08-01: 80 mg via INTRAMUSCULAR

## 2013-08-01 NOTE — Progress Notes (Signed)
08/01/13- 73 yoF never smoker  Self referral-asthma; constant cough per patient. Long history of asthma. Was an allergy vaccine patient of Dr. Gerilyn Nestle for several years and says it helped. Cough has been worse for last 5 months, worse in cold weather and better in steam. No other triggers recognized. Cough productive clear phlegm. She denies nasal congestion or drainage, using Nasacort. Chest feels tight. Recognizes GERD especially at night. She describes raspy feeling in her upper airway. Now on a prednisone taper. Has lived in her current house for 3 years with no smokers, gas heat, some carpet, no pets, no mold. Son has asthma. CXR 07/28/13 IMPRESSION:  No active cardiopulmonary disease.  Electronically Signed  By: [name]  On: 07/28/2013 13:57 PFT 10/06/2007- WNL. FVC 2.66/ 88%, FEV1 2.14/ 98%, FEV1/FVC 0.8, TLC 105%, DLCO 98%  Prior to Admission medications   Medication Sig Start Date End Date Taking? Authorizing Provider  albuterol (PROVENTIL HFA;VENTOLIN HFA) 108 (90 BASE) MCG/ACT inhaler Inhale 2 puffs into the lungs every 4 (four) hours as needed for wheezing. 08/01/13  Yes Deneise Lever, MD  escitalopram (LEXAPRO) 10 MG tablet Take 5 mg by mouth daily. 04/12/13  Yes Hendricks Limes, MD  esomeprazole (Addison) 20 MG capsule Before b'fast & eve meal 07/28/13  Yes Hendricks Limes, MD  HYDROcodone-homatropine (HYDROMET) 5-1.5 MG/5ML syrup Take 5 mLs by mouth every 6 (six) hours as needed for cough. 07/28/13  Yes Hendricks Limes, MD  L-Lysine 500 MG TABS Take by mouth as needed.     Yes Historical Provider, MD  montelukast (SINGULAIR) 10 MG tablet TAKE ONE TABLET AT BEDTIME 05/25/13  Yes Hendricks Limes, MD  predniSONE (DELTASONE) 20 MG tablet Take 1 tablet (20 mg total) by mouth 2 (two) times daily. 07/28/13  Yes Hendricks Limes, MD  SYMBICORT 160-4.5 MCG/ACT inhaler INHALE 2 PUFFS INTO LUNGS TWICE DAILY 10/12/12  Yes Hendricks Limes, MD  mometasone-formoterol [facility]) 200-5 MCG/ACT AERO 2  puffs then rinse mouth, twice daily- maintenance 08/01/13   Deneise Lever, MD   Past Medical History  Diagnosis Date  . seizure 59  . Colon polyp 2003  . Hyperlipidemia   . Asthma   . Allergic rhinitis     former patient at [facility] Chest with allergy injections   Past Surgical History  Procedure Laterality Date  . Tonsillectomy    . Colonoscopy    . Patent ductus arterious repair      AGE 73  . Breast surgery      possible tumor-benign   Family History  Problem Relation Age of Onset  . Depression Mother   . Colon cancer Mother   . Depression Sister   . Alcohol abuse Father   . Breast cancer Paternal Grandmother   . Asthma Son   . Allergic rhinitis Son   . Pancreatic cancer Sister     2010  . Other Sister     polio   History   Social History  . Marital Status: Married    Spouse Name: N/A    Number of Children: 2  . Years of Education: N/A   Occupational History  . teacher-small business owner-retired    Social History Main Topics  . Smoking status: Never Smoker   . Smokeless tobacco: Not on file  . Alcohol Use: Yes     Comment: 4-5 drinks per week  . Drug Use: No  . Sexual Activity: Not on file   Other Topics Concern  .  Not on file   Social History Narrative  . No narrative on file   ROS-see HPI Constitutional:   No-   weight loss, night sweats, fevers, chills, fatigue, lassitude. HEENT:   No-  headaches, difficulty swallowing, +tooth/dental problems, +sore throat,       No-  sneezing, itching, +ear ache, nasal congestion, post nasal drip,  CV:  No-   chest pain, orthopnea, PND, swelling in lower extremities, anasarca,                                  dizziness, palpitations Resp: +  shortness of breath with exertion or at rest.              + productive cough,  No non-productive cough,  No- coughing up of blood.              No-   change in color of mucus.  No- wheezing.   Skin: No-   rash or lesions. GI:  +   heartburn, indigestion, abdominal pain,  nausea, vomiting, diarrhea,                 change in bowel habits, loss of appetite GU: No-   dysuria, change in color of urine, no urgency or frequency.  No- flank pain. MS:  No-   joint pain or swelling.  No- decreased range of motion.  No- back pain. Neuro-     nothing unusual Psych:  No- change in mood or affect. No depression +anxiety.  No memory loss.  OBJ- Physical Exam General- Alert, Oriented, Affect-appropriate, Distress- none acute Skin- rash-none, lesions- none, excoriation- none Lymphadenopathy- none Head- atraumatic            Eyes- Gross vision intact, PERRLA, conjunctivae and secretions clear            Ears- Hearing, canals-normal            Nose- Clear, no-Septal dev, mucus, polyps, erosion, perforation             Throat- Mallampati II , mucosa clear , drainage- none, tonsils- atrophic Neck- flexible , trachea midline, no stridor , thyroid nl, carotid no bruit Chest - symmetrical excursion , unlabored           Heart/CV- RRR , no murmur , no gallop  , no rub, nl s1 s2                           - JVD- none , edema- none, stasis changes- none, varices- none           Lung- clear to P&A, wheeze- none, cough+raspy , dullness-none, rub- none           Chest wall-  Abd- tender-no, distended-no, bowel sounds-present, HSM- no Br/ Gen/ Rectal- Not done, not indicated Extrem- cyanosis- none, clubbing, none, atrophy- none, strength- nl Neuro- grossly intact to observation

## 2013-08-01 NOTE — Patient Instructions (Addendum)
Script for albuterol rescue inhaler  Sample and script for Dulera 200 to try instead of Symbicort    2 puffs then rinse mouth, twice daily  Neb xop 0.63  Depo 80  Order lab- Allergy profile  Finish the prednisone from Dr Linna Darner

## 2013-08-02 LAB — ALLERGY FULL PROFILE
Allergen, D pternoyssinus,d7: 9.47 kU/L — ABNORMAL HIGH
Allergen,Goose feathers, e70: <0.1 kU/L
Aspergillus fumigatus, m3: <0.1 kU/L
BERMUDA GRASS: 0.11 kU/L — AB
Bahia Grass: 0.11 kU/L — ABNORMAL HIGH
Box Elder IgE: 0.16 kU/L — ABNORMAL HIGH
Cat Dander: 1.19 kU/L — ABNORMAL HIGH
Common Ragweed: 6.09 kU/L — ABNORMAL HIGH
D. farinae: 19.4 kU/L — ABNORMAL HIGH
DOG DANDER: 0.64 kU/L — AB
Elm IgE: 0.17 kU/L — ABNORMAL HIGH
FESCUE: 0.12 kU/L — AB
G005 Rye, Perennial: 0.12 kU/L — ABNORMAL HIGH
G009 Red Top: 0.12 kU/L — ABNORMAL HIGH
Goldenrod: 0.8 kU/L — ABNORMAL HIGH
Helminthosporium halodes: <0.1 kU/L
House Dust Hollister: 1.77 kU/L — ABNORMAL HIGH
IgE (Immunoglobulin E), Serum: 151.4 IU/mL (ref 0.0–180.0)
LAMB'S QUARTERS CLASS: 0.12 kU/L — AB
Oak: 0.68 kU/L — ABNORMAL HIGH
PLANTAIN: 0.11 kU/L — AB
SYCAMORE TREE: 0.11 kU/L — AB
Stemphylium Botryosum: <0.1 kU/L
Timothy Grass: <0.1 kU/L

## 2013-08-24 ENCOUNTER — Encounter: Payer: Self-pay | Admitting: Internal Medicine

## 2013-08-24 NOTE — Assessment & Plan Note (Signed)
Asthma with bronchitis. Her only recognize trigger has been cold air but  GERD may be more significant than she realizes. She has seen some benefit from inhalers. Plan-try changing Symbicort to [facility] 200 which we'll increase her inhaled steroid. Refill rescue inhaler. Finish prednisone. Lab for allergy profile

## 2013-08-24 NOTE — Assessment & Plan Note (Signed)
Mostly controlled with Nasacort

## 2013-09-12 ENCOUNTER — Ambulatory Visit: Payer: Medicare Other | Admitting: Internal Medicine

## 2013-09-28 ENCOUNTER — Encounter: Payer: Self-pay | Admitting: Internal Medicine

## 2013-09-28 ENCOUNTER — Ambulatory Visit (INDEPENDENT_AMBULATORY_CARE_PROVIDER_SITE_OTHER): Payer: Medicare Other | Admitting: Internal Medicine

## 2013-09-28 VITALS — BP 123/82 | HR 72 | Temp 98.3°F | Wt 137.2 lb

## 2013-09-28 DIAGNOSIS — J019 Acute sinusitis, unspecified: Secondary | ICD-10-CM

## 2013-09-28 MED ORDER — AMOXICILLIN-POT CLAVULANATE 500-125 MG PO TABS: ORAL_TABLET | ORAL | Status: DC

## 2013-09-28 MED ORDER — ALBUTEROL SULFATE HFA 108 (90 BASE) MCG/ACT IN AERS: 2.0000 | INHALATION_SPRAY | RESPIRATORY_TRACT | Status: DC | PRN

## 2013-09-28 NOTE — Patient Instructions (Signed)

## 2013-09-28 NOTE — Progress Notes (Signed)
   Subjective:    Patient ID: Susan Chavez, female    DOB: 01-13-1941, 73 y.o.   MRN: [id number]  HPI  The symptoms began within the last week as pain and pressure in her sinuses. She has also had yellow/brown chest >  nasal secretions. She believes the drainage from the  sinuses is the cause of the productive cough. Mucinex was of no benefit but nasal saline did help.  She also had some associated sore throat.  Despite her history of asthma; she has not had to use her rescue inhaler recently.  No otic discharge. She has noticed pain in her ears.    Review of Systems   She has had some otic pain without discharge  She's had no significant extrinsic symptoms of itchy, watery eyes, sneezing  She has unrelated night sweats occasionally. She has no fever or chills.     Objective:   Physical Exam General appearance:good health ;well nourished; no acute distress or increased work of breathing is present.  No  lymphadenopathy about the head, neck, or axilla noted.   Eyes: No conjunctival inflammation or lid edema is present.   Ears:  External ear exam shows no significant lesions or deformities.  Otoscopic examination reveals clear canals, tympanic membranes are intact bilaterally without bulging, retraction, inflammation or discharge.  Nose:  External nasal examination shows no deformity or inflammation. Nasal mucosa are pink and moist without lesions or exudates. No septal dislocation or deviation.No obstruction to airflow.   Oral exam: Dental hygiene is good; lips and gums are healthy appearing.There is no oropharyngeal erythema or exudate noted.   Neck:  No deformities,  masses, or tenderness noted.   Supple with full range of motion without pain.   Heart:  Normal rate and regular rhythm. S1 and S2 normal without gallop, murmur, click, rub or other extra sounds.   Lungs:Minor rales ; no wheezes, rhonchi,or rubs present.No increased work of breathing.    Extremities:  No  cyanosis, edema, or clubbing  noted    Skin: Warm & dry          Assessment & Plan:  #1 rhinosinusitis without significant bronchitis  Plan: Nasal hygiene interventions discussed. See prescription medications

## 2013-09-28 NOTE — Progress Notes (Signed)
Pre visit review using our clinic review tool, if applicable. No additional management support is needed unless otherwise documented below in the visit note. 

## 2013-10-10 ENCOUNTER — Encounter: Payer: Self-pay | Admitting: Internal Medicine

## 2013-10-10 ENCOUNTER — Ambulatory Visit (INDEPENDENT_AMBULATORY_CARE_PROVIDER_SITE_OTHER): Payer: Medicare Other | Admitting: Internal Medicine

## 2013-10-10 VITALS — BP 120/64 | HR 61 | Ht 64.0 in | Wt 140.2 lb

## 2013-10-10 DIAGNOSIS — J3089 Other allergic rhinitis: Principal | ICD-10-CM

## 2013-10-10 DIAGNOSIS — J302 Other seasonal allergic rhinitis: Secondary | ICD-10-CM

## 2013-10-10 DIAGNOSIS — J309 Allergic rhinitis, unspecified: Secondary | ICD-10-CM

## 2013-10-10 DIAGNOSIS — J45909 Unspecified asthma, uncomplicated: Secondary | ICD-10-CM

## 2013-10-10 NOTE — Progress Notes (Signed)
08/01/13- 86 yoF never smoker  Self referral-asthma; constant cough per patient. Long history of asthma. Was an allergy vaccine patient of Dr. Gerilyn Nestle for several years and says it helped. Cough has been worse for last 5 months, worse in cold weather and better in steam. No other triggers recognized. Cough productive clear phlegm. She denies nasal congestion or drainage, using Nasacort. Chest feels tight. Recognizes GERD especially at night. She describes raspy feeling in her upper airway. Now on a prednisone taper. Has lived in her current house for 3 years with no smokers, gas heat, some carpet, no pets, no mold. Son has asthma. CXR 07/28/13 IMPRESSION:  No active cardiopulmonary disease.  Electronically Signed  By: [name]  On: 07/28/2013 13:57 PFT 10/06/2007- WNL. FVC 2.66/ 88%, FEV1 2.14/ 98%, FEV1/FVC 0.8, TLC 105%, DLCO 98%  10/10/13- 72 yoF never smoker  Self referral-asthma; constant cough per patient. FOLLOWS AOZ:HYQMVH is working well for the patient; review labs in detail with patient. Still having a deeper voice than usual. Allergy wise she is doing well-1 sinus episode and 1 time had clogged up sinus area with windy days Feels happy with status, well now. Minimal cough. Allergy Profile 08/01/13- Total IgE 151.4, Broad elevations for many common environmental allergens.  ROS-see HPI Constitutional:   No-   weight loss, night sweats, fevers, chills, fatigue, lassitude. HEENT:   No-  headaches, difficulty swallowing, +tooth/dental problems, +sore throat,       No-  sneezing, itching, +ear ache, nasal congestion, post nasal drip,  CV:  No-   chest pain, orthopnea, PND, swelling in lower extremities, anasarca,                                  dizziness, palpitations Resp: +  shortness of breath with exertion or at rest.              No- productive cough,  + non-productive cough,  No- coughing up of blood.              No-   change in color of mucus.  No- wheezing.   Skin: No-   rash  or lesions. GI:  No- heartburn, indigestion, abdominal pain, nausea, vomiting,  GU:  MS:  No-   joint pain or swelling.   Neuro-     nothing unusual Psych:  No- change in mood or affect. No depression +anxiety.  No memory loss.  OBJ- Physical Exam General- Alert, Oriented, Affect-appropriate, Distress- none acute Skin- rash-none, lesions- none, excoriation- none Lymphadenopathy- none Head- atraumatic            Eyes- Gross vision intact, PERRLA, conjunctivae and secretions clear            Ears- Hearing, canals-normal            Nose- Clear, no-Septal dev, mucus, polyps, erosion, perforation             Throat- Mallampati II , mucosa clear , drainage- none, tonsils- atrophic Neck- flexible , trachea midline, no stridor , thyroid nl, carotid no bruit Chest - symmetrical excursion , unlabored           Heart/CV- RRR , no murmur , no gallop  , no rub, nl s1 s2                           - JVD- none , edema- none,  stasis changes- none, varices- none           Lung- clear to P&A, wheeze- none, cough+raspy, dullness-none, rub- none           Chest wall-  Abd-  Br/ Gen/ Rectal- Not done, not indicated Extrem- cyanosis- none, clubbing, none, atrophy- none, strength- nl Neuro- grossly intact to observation

## 2013-10-10 NOTE — Patient Instructions (Signed)
Ok to try off [place] to try reducing Dulera to 1 puff then rinse mouth, twice daily  Ok to keep the rescue albuterol inhaler available in case needed

## 2013-10-31 NOTE — Assessment & Plan Note (Signed)
Good control for now

## 2013-11-01 NOTE — Assessment & Plan Note (Signed)
Controlled Plan-okay to reduce Dulera to 1 puff twice daily for trial. Keep rescue inhaler. Okay to quit Singulair and watch

## 2014-02-07 ENCOUNTER — Other Ambulatory Visit: Payer: Self-pay | Admitting: Internal Medicine

## 2014-02-07 NOTE — Telephone Encounter (Signed)
#  [place]

## 2014-03-28 ENCOUNTER — Telehealth: Payer: Self-pay | Admitting: Internal Medicine

## 2014-03-28 NOTE — Telephone Encounter (Signed)
Called spoke with patient, and advised of CY's recs and work-in appt tomorrow 9.10.15 Pt okay with this appt time - appt scheduled Nothing further needed; will sign of

## 2014-03-28 NOTE — Telephone Encounter (Signed)
Called spoke with patient who reports wheezing, PND at night, sore throat, chest congestion, prod cough with clear mucus, some increased SOB x3-4 days.  Denies f/c/s, n/v/d, hemoptysis.  Has been using [employer], inhalers, Benadryl as needed.  Pt is requesting to come in for breathing treatment if possible.  Going to [place] on the 18th, has an upcoming appt on the 17th [facility] Allergies  Allergen Reactions  . Prednisone     REACTION: Cushingnoid facies  . Tetanus Toxoid     Eyelid edema FROM HORSES; therefore , she was told she could not take Tetanus shot.She has had tetanus shot w/o adverse effect since 2002   Dr Annamaria Boots please advise, thank you!

## 2014-03-28 NOTE — Telephone Encounter (Signed)
Looks as though patient has appt same day she is to leave for her trip. Please let patient know we can see her tomorrow Thursday 03-29-14 at 1:45pm and decide on treatment needed then. Thanks.

## 2014-03-29 ENCOUNTER — Encounter: Payer: Self-pay | Admitting: Internal Medicine

## 2014-03-29 ENCOUNTER — Ambulatory Visit (INDEPENDENT_AMBULATORY_CARE_PROVIDER_SITE_OTHER): Payer: Medicare Other | Admitting: Internal Medicine

## 2014-03-29 VITALS — BP 136/80 | HR 73 | Ht 64.0 in | Wt 135.4 lb

## 2014-03-29 DIAGNOSIS — J45909 Unspecified asthma, uncomplicated: Secondary | ICD-10-CM

## 2014-03-29 DIAGNOSIS — J3089 Other allergic rhinitis: Secondary | ICD-10-CM

## 2014-03-29 DIAGNOSIS — J302 Other seasonal allergic rhinitis: Secondary | ICD-10-CM

## 2014-03-29 DIAGNOSIS — J309 Allergic rhinitis, unspecified: Secondary | ICD-10-CM

## 2014-03-29 MED ORDER — AZITHROMYCIN 250 MG PO TABS: ORAL_TABLET | ORAL | Status: DC

## 2014-03-29 MED ORDER — LEVALBUTEROL HCL 0.63 MG/3ML IN NEBU
0.6300 mg | INHALATION_SOLUTION | Freq: Once | RESPIRATORY_TRACT | Status: AC
  Administered 2014-03-29: 0.63 mg via RESPIRATORY_TRACT

## 2014-03-29 MED ORDER — METHYLPREDNISOLONE ACETATE 80 MG/ML IJ SUSP
80.0000 mg | Freq: Once | INTRAMUSCULAR | Status: AC
  Administered 2014-03-29: 80 mg via INTRAMUSCULAR

## 2014-03-29 NOTE — Assessment & Plan Note (Signed)
Probably viral exacerbation of what is usually mild intermittent uncomplicated asthma Plan- wait on flu shot. Neb xop and depomedrol today. Zpak to carry for trip to [facility]

## 2014-03-29 NOTE — Patient Instructions (Addendum)
Neb xop 0.63  Depo 80  Script sent for Zpak antibitotic to take on your trip in case needed  Please call if we can help

## 2014-03-29 NOTE — Assessment & Plan Note (Signed)
Taking antihistamine- controlled

## 2014-03-29 NOTE — Progress Notes (Signed)
08/01/13- 53 yoF never smoker  Self referral-asthma; constant cough per patient. Long history of asthma. Was an allergy vaccine patient of Dr. Gerilyn Nestle for several years and says it helped. Cough has been worse for last 5 months, worse in cold weather and better in steam. No other triggers recognized. Cough productive clear phlegm. She denies nasal congestion or drainage, using Nasacort. Chest feels tight. Recognizes GERD especially at night. She describes raspy feeling in her upper airway. Now on a prednisone taper. Has lived in her current house for 3 years with no smokers, gas heat, some carpet, no pets, no mold. Son has asthma. CXR 07/28/13 IMPRESSION:  No active cardiopulmonary disease.  Electronically Signed  By: [name]  On: 07/28/2013 13:57 PFT 10/06/2007- WNL. FVC 2.66/ 88%, FEV1 2.14/ 98%, FEV1/FVC 0.8, TLC 105%, DLCO 98%  10/10/13- 72 yoF never smoker  Self referral-asthma; constant cough per patient. FOLLOWS ZTI:WPYKDX is working well for the patient; review labs in detail with patient. Still having a deeper voice than usual. Allergy wise she is doing well-1 sinus episode and 1 time had clogged up sinus area with windy days Feels happy with status, well now. Minimal cough. Allergy Profile 08/01/13- Total IgE 151.4, Broad elevations for many common environmental allergens.  03/29/14- 73 yoF never smoker  Self referral-asthma; constant cough per patient. FOLLOWS FOR:  acute visit today.  congestion and wheezing. much better today.   1 week of acute onset wheeze, sore throat, no fever, clear mucus/ productive cough. She resumed Singulair. Better today. Going to [place] with husb who was on chemoRx for lymphoma.  ROS-see HPI Constitutional:   No-   weight loss, night sweats, fevers, chills, fatigue, lassitude. HEENT:   No-  headaches, difficulty swallowing, tooth/dental problems, +sore throat,       No-  sneezing, itching, ear ache, +nasal congestion, +post nasal drip,  CV:  No-    chest pain, orthopnea, PND, swelling in lower extremities, anasarca,                                  dizziness, palpitations Resp: +  shortness of breath with exertion or at rest.             + productive cough,  + non-productive cough,  No- coughing up of blood.              No-   change in color of mucus.  No- wheezing.   Skin: No-   rash or lesions. GI:  No- heartburn, indigestion, abdominal pain, nausea, vomiting,  GU:  MS:  No-   joint pain or swelling.   Neuro-     nothing unusual Psych:  No- change in mood or affect. No depression +anxiety.  No memory loss.  OBJ- Physical Exam General- Alert, Oriented, Affect-appropriate, Distress- none acute Skin- rash-none, lesions- none, excoriation- none Lymphadenopathy- none Head- atraumatic            Eyes- Gross vision intact, PERRLA, conjunctivae and secretions clear            Ears- Hearing, canals-normal            Nose- Clear, no-Septal dev, mucus, polyps, erosion, perforation             Throat- Mallampati II , mucosa clear , drainage- none, tonsils- atrophic Neck- flexible , trachea midline, no stridor , thyroid nl, carotid no bruit Chest - symmetrical excursion ,  unlabored           Heart/CV- RRR , no murmur , no gallop  , no rub, nl s1 s2                           - JVD- none , edema- none, stasis changes- none, varices- none           Lung- +raspy on exhalation, wheeze- none, cough+raspy, dullness-none, rub- none           Chest wall-  Abd-  Br/ Gen/ Rectal- Not done, not indicated Extrem- cyanosis- none, clubbing, none, atrophy- none, strength- nl Neuro- grossly intact to observation

## 2014-04-06 ENCOUNTER — Ambulatory Visit: Payer: Medicare Other | Admitting: Internal Medicine

## 2014-04-12 ENCOUNTER — Ambulatory Visit: Payer: Medicare Other | Admitting: Internal Medicine

## 2014-04-20 ENCOUNTER — Ambulatory Visit (INDEPENDENT_AMBULATORY_CARE_PROVIDER_SITE_OTHER)
Admission: RE | Admit: 2014-04-20 | Discharge: 2014-04-20 | Disposition: A | Discharge status: Home / Self Care | Payer: Medicare Other | Source: Ambulatory Visit | Attending: Adult Health | Admitting: Adult Health

## 2014-04-20 ENCOUNTER — Encounter: Payer: Self-pay | Admitting: Adult Health

## 2014-04-20 ENCOUNTER — Ambulatory Visit (INDEPENDENT_AMBULATORY_CARE_PROVIDER_SITE_OTHER): Payer: Medicare Other | Admitting: Adult Health

## 2014-04-20 VITALS — BP 132/82 | HR 80 | Temp 98.3°F | Ht 64.0 in | Wt 137.4 lb

## 2014-04-20 DIAGNOSIS — J45901 Unspecified asthma with (acute) exacerbation: Secondary | ICD-10-CM

## 2014-04-20 MED ORDER — HYDROCODONE-HOMATROPINE 5-1.5 MG/5ML PO SYRP: 5.0000 mL | ORAL_SOLUTION | Freq: Four times a day (QID) | ORAL | Status: DC | PRN

## 2014-04-20 MED ORDER — PREDNISONE 10 MG PO TABS: ORAL_TABLET | ORAL | Status: DC

## 2014-04-20 MED ORDER — MOMETASONE FURO-FORMOTEROL FUM 200-5 MCG/ACT IN AERO: INHALATION_SPRAY | RESPIRATORY_TRACT | Status: DC

## 2014-04-20 MED ORDER — AMOXICILLIN-POT CLAVULANATE 875-125 MG PO TABS: 1.0000 | ORAL_TABLET | Freq: Two times a day (BID) | ORAL | Status: DC

## 2014-04-20 MED ORDER — LEVALBUTEROL HCL 0.63 MG/3ML IN NEBU
0.6300 mg | INHALATION_SOLUTION | Freq: Once | RESPIRATORY_TRACT | Status: AC
Start: 2014-04-20 — End: 2014-04-20
  Administered 2014-04-20: 0.63 mg via RESPIRATORY_TRACT

## 2014-04-20 NOTE — Progress Notes (Signed)
08/01/13- 73 yoF never smoker  Self referral-asthma; constant cough per patient. Long history of asthma. Was an allergy vaccine patient of Dr. Gerilyn Nestle for several years and says it helped. Cough has been worse for last 5 months, worse in cold weather and better in steam. No other triggers recognized. Cough productive clear phlegm. She denies nasal congestion or drainage, using Nasacort. Chest feels tight. Recognizes GERD especially at night. She describes raspy feeling in her upper airway. Now on a prednisone taper. Has lived in her current house for 3 years with no smokers, gas heat, some carpet, no pets, no mold. Son has asthma. CXR 07/28/13 IMPRESSION:  No active cardiopulmonary disease.  Electronically Signed  By: [name]  On: 07/28/2013 13:57 PFT 10/06/2007- WNL. FVC 2.66/ 88%, FEV1 2.14/ 98%, FEV1/FVC 0.8, TLC 105%, DLCO 98%  10/10/13- 72 yoF never smoker  Self referral-asthma; constant cough per patient. FOLLOWS PRF:FMBWGY is working well for the patient; review labs in detail with patient. Still having a deeper voice than usual. Allergy wise she is doing well-1 sinus episode and 1 time had clogged up sinus area with windy days Feels happy with status, well now. Minimal cough. Allergy Profile 08/01/13- Total IgE 151.4, Broad elevations for many common environmental allergens.  03/29/14- 73 yoF never smoker  Self referral-asthma; constant cough per patient. FOLLOWS FOR:  acute visit today.  congestion and wheezing. much better today.   1 week of acute onset wheeze, sore throat, no fever, clear mucus/ productive cough. She resumed Singulair. Better today. Going to [place] with husb who was on chemoRx for lymphoma.  04/20/2014 Acute OV  Complains of increased SOB, wheezing, prod cough with white/clear mucus, head congestion, bilateral ear congestion, HA x5 days.  Finished zpak yesterday.   Denies any f/c/s, n/v/d, hemoptysis, chest pain, orthopnea, calf pain .  Had mild URI 1 month  ago, resolved shortly and felt much better until 5 days ago.  Says several people on the trip got cold with cough and congestion . Husband has similar symptoms.  On Dulera but out for 2 weeks .  Coughing up thick white mucus . Cough is keeping her up at night and has daytime coughing fit.       ROS-see HPI Constitutional:   No-   weight loss, night sweats, fevers, chills, fatigue, lassitude. HEENT:   No-  headaches, difficulty swallowing, tooth/dental problems, +sore throat,       No-  sneezing, itching, ear ache, +nasal congestion, +post nasal drip,  CV:  No-   chest pain, orthopnea, PND, swelling in lower extremities, anasarca,                                  dizziness, palpitations Resp: +  shortness of breath with exertion or at rest.             + productive cough,  + non-productive cough,  No- coughing up of blood.              No-   change in color of mucus.  No- wheezing.   Skin: No-   rash or lesions. GI:  No- heartburn, indigestion, abdominal pain, nausea, vomiting,  GU:  MS:  No-   joint pain or swelling.   Neuro-     nothing unusual Psych:  No- change in mood or affect. No depression +anxiety.  No memory loss.  OBJ- Physical Exam General- Alert, Oriented,  Affect-appropriate, Distress- none acute Skin- rash-none, lesions- none, excoriation- none Lymphadenopathy- none Head- atraumatic            Eyes- Gross vision intact, PERRLA, conjunctivae and secretions clear            Ears- Hearing, canals-normal            Nose- Clear, no-Septal dev, mucus, polyps, erosion, perforation             Throat- Mallampati II , mucosa clear , drainage- none, tonsils- atrophic Neck- flexible , trachea midline, no stridor , thyroid nl, carotid no bruit Chest - symmetrical excursion , unlabored           Heart/CV- RRR , no murmur , no gallop  , no rub, nl s1 s2                           - JVD- none , edema- none, stasis changes- none, varices- none           Lung- +raspy on exhalation,  wheeze- none, cough+raspy, dullness-none, rub- none           Chest wall-  Abd-  Br/ Gen/ Rectal- Not done, not indicated Extrem- cyanosis- none, clubbing, none, atrophy- none, strength- nl Neuro- grossly intact to observation   cXR 04/20/2014 >clear

## 2014-04-20 NOTE — Assessment & Plan Note (Signed)
Exacerbation  cxr w/ no acute process xopenex neb x 1   Plan  Augmentin 875mg  Twice daily  For  7 days -take w/ food .  Prednisone taper over next week.  Mucinex DM twice daily as needed. For cough and congestion. Restart Dulera  2 puffs twice daily, rinse after use. Begin Probiotic daily while on antibiotics,(Align)  Hydromet 1 tsp every 6hr As needed  Cough/congesiton  Please contact office for sooner follow up if symptoms do not improve or worsen or seek emergency care  Follow up Dr. Annamaria Boots  In 6 weeks and As needed

## 2014-04-20 NOTE — Addendum Note (Signed)
Addended by: Parke Poisson E on: 04/20/2014 05:26 PM   Modules accepted: Orders

## 2014-04-20 NOTE — Patient Instructions (Signed)
Augmentin 875mg  Twice daily  For  7 days -take w/ food .  Prednisone taper over next week.  Mucinex DM twice daily as needed. For cough and congestion. Restart Dulera  2 puffs twice daily, rinse after use. Begin Probiotic daily while on antibiotics,(Align)  Hydromet 1 tsp every 6hr As needed  Cough/congesiton  Please contact office for sooner follow up if symptoms do not improve or worsen or seek emergency care  Follow up Dr. Annamaria Boots  In 6 weeks and As needed

## 2014-04-25 ENCOUNTER — Ambulatory Visit: Payer: Medicare Other

## 2014-04-27 ENCOUNTER — Encounter: Payer: Self-pay | Admitting: Internal Medicine

## 2014-04-27 ENCOUNTER — Ambulatory Visit (INDEPENDENT_AMBULATORY_CARE_PROVIDER_SITE_OTHER): Payer: Medicare Other | Admitting: Internal Medicine

## 2014-04-27 VITALS — BP 128/80 | HR 72 | Ht 64.0 in | Wt 136.6 lb

## 2014-04-27 DIAGNOSIS — Z8774 Personal history of (corrected) congenital malformations of heart and circulatory system: Secondary | ICD-10-CM

## 2014-04-27 DIAGNOSIS — J45901 Unspecified asthma with (acute) exacerbation: Secondary | ICD-10-CM

## 2014-04-27 DIAGNOSIS — Z9889 Other specified postprocedural states: Secondary | ICD-10-CM

## 2014-04-27 MED ORDER — AZITHROMYCIN 250 MG PO TABS: ORAL_TABLET | ORAL | Status: DC

## 2014-04-27 MED ORDER — METHYLPREDNISOLONE ACETATE 80 MG/ML IJ SUSP
80.0000 mg | Freq: Once | INTRAMUSCULAR | Status: AC
  Administered 2014-04-27: 80 mg via INTRAMUSCULAR

## 2014-04-27 MED ORDER — LEVALBUTEROL HCL 0.63 MG/3ML IN NEBU
0.6300 mg | INHALATION_SOLUTION | Freq: Once | RESPIRATORY_TRACT | Status: AC
  Administered 2014-04-27: 0.63 mg via RESPIRATORY_TRACT

## 2014-04-27 NOTE — Assessment & Plan Note (Signed)
No murmur heard

## 2014-04-27 NOTE — Assessment & Plan Note (Signed)
Asthma with bronchitis, moderate, persistent. This began as a viral syndrome and is clearing slowly. Plan- neb xop, depomedrol, zithromax- more for anti-inflammatory than antibacterial efect

## 2014-04-27 NOTE — Patient Instructions (Signed)
Neb xop 0.63  Depo 80  Script sent for Z pak  Mucinex is fine if it seems to help

## 2014-04-27 NOTE — Progress Notes (Signed)
08/01/13- 74 yoF never smoker  Self referral-asthma; constant cough per patient. Long history of asthma. Was an allergy vaccine patient of Dr. Gerilyn Nestle for several years and says it helped. Cough has been worse for last 5 months, worse in cold weather and better in steam. No other triggers recognized. Cough productive clear phlegm. She denies nasal congestion or drainage, using Nasacort. Chest feels tight. Recognizes GERD especially at night. She describes raspy feeling in her upper airway. Now on a prednisone taper. Has lived in her current house for 3 years with no smokers, gas heat, some carpet, no pets, no mold. Son has asthma. CXR 07/28/13 IMPRESSION:  No active cardiopulmonary disease.  Electronically Signed  By: [name]  On: 07/28/2013 13:57 PFT 10/06/2007- WNL. FVC 2.66/ 88%, FEV1 2.14/ 98%, FEV1/FVC 0.8, TLC 105%, DLCO 98%  10/10/13- 72 yoF never smoker  Self referral-asthma; constant cough per patient. FOLLOWS JIR:CVELFY is working well for the patient; review labs in detail with patient. Still having a deeper voice than usual. Allergy wise she is doing well-1 sinus episode and 1 time had clogged up sinus area with windy days Feels happy with status, well now. Minimal cough. Allergy Profile 08/01/13- Total IgE 151.4, Broad elevations for many common environmental allergens.  03/29/14- 73 yoF never smoker  Self referral-asthma; constant cough per patient. FOLLOWS FOR:  acute visit today.  congestion and wheezing. much better today.   1 week of acute onset wheeze, sore throat, no fever, clear mucus/ productive cough. She resumed Singulair. Better today. Going to [place] with husb who was on chemoRx for lymphoma.  04/20/2014 Acute OV  Complains of increased SOB, wheezing, prod cough with white/clear mucus, head congestion, bilateral ear congestion, HA x5 days.  Finished zpak yesterday.   Denies any f/c/s, n/v/d, hemoptysis, chest pain, orthopnea, calf pain .  Had mild URI 1 month  ago, resolved shortly and felt much better until 5 days ago.  Says several people on the trip got cold with cough and congestion . Husband has similar symptoms.  On Dulera but out for 2 weeks .  Coughing up thick white mucus . Cough is keeping her up at night and has daytime coughing fit.   04/27/14-73 yoF never smoker followed for asthma, chronic cough, allergic rhinitis ACUTE VISIT: Seen 04-20-14 by TP; fatigued, Chest tightness with wheezing, cough-productive, nasal drainage. Denies any fevers.  Current illness began as a definite bad cold while on bus trip with husband. Seen on 10/2 by TP. Improved some after taking pred taper, augmentin, restarting Dulera, occasional use of cough syrup. Still active cough productive white, nasal congestion with white, persistent soreness L upper anterior chest wall. Denies fever, blood, nodes, palpitation, leg pain or swelling. CXR 04/20/14 IMPRESSION:  No active cardiopulmonary disease.  Electronically Signed  By: Dereck Ligas M.D.  On: 04/20/2014 16:06  ROS-see HPI Constitutional:   No-   weight loss, night sweats, fevers, chills, fatigue, lassitude. HEENT:   No-  headaches, difficulty swallowing, tooth/dental problems, +sore throat,       No-  sneezing, itching, ear ache, +nasal congestion, +post nasal drip,  CV:  No-   chest pain, orthopnea, PND, swelling in lower extremities, anasarca,                                  dizziness, palpitations Resp: +  shortness of breath with exertion or at rest.             +  productive cough,  + non-productive cough,  No- coughing up of blood.              No-   change in color of mucus.  No- wheezing.   Skin: No-   rash or lesions. GI:  No- heartburn, indigestion, abdominal pain, nausea, vomiting,  GU:  MS:  No-   joint pain or swelling.   Neuro-     nothing unusual Psych:  No- change in mood or affect. No depression +anxiety.  No memory loss.  OBJ- Physical Exam General- Alert, Oriented,  Affect-appropriate, Distress- none acute, trim, alert Skin- rash-none, lesions- none, excoriation- none Lymphadenopathy- none Head- atraumatic            Eyes- Gross vision intact, PERRLA, conjunctivae and secretions clear            Ears- Hearing, canals-normal            Nose- Clear, no-Septal dev, mucus, polyps, erosion, perforation             Throat- Mallampati II , mucosa clear , drainage- none, tonsils- atrophic Neck- flexible , trachea midline, no stridor , thyroid nl, carotid no bruit Chest - symmetrical excursion , unlabored           Heart/CV- RRR , no murmur , no gallop  , no rub, nl s1 s2                           - JVD- none , edema- none, stasis changes- none, varices- none           Lung- +raspy bronchitic cough-mild and dry, wheeze- none,                                                    dullness-none, rub- none           Chest wall-  Abd-  Br/ Gen/ Rectal- Not done, not indicated Extrem- cyanosis- none, clubbing, none, atrophy- none, strength- nl Neuro- grossly intact to observation   cXR 04/20/2014 >clear

## 2014-04-30 ENCOUNTER — Ambulatory Visit (INDEPENDENT_AMBULATORY_CARE_PROVIDER_SITE_OTHER): Payer: Medicare Other

## 2014-04-30 DIAGNOSIS — Z23 Encounter for immunization: Secondary | ICD-10-CM

## 2014-05-08 ENCOUNTER — Ambulatory Visit: Payer: Medicare Other

## 2014-05-10 ENCOUNTER — Ambulatory Visit (INDEPENDENT_AMBULATORY_CARE_PROVIDER_SITE_OTHER): Payer: Medicare Other

## 2014-05-10 DIAGNOSIS — Z23 Encounter for immunization: Secondary | ICD-10-CM

## 2014-05-17 ENCOUNTER — Encounter: Payer: Self-pay | Admitting: Internal Medicine

## 2014-07-04 ENCOUNTER — Other Ambulatory Visit: Payer: Self-pay | Admitting: Internal Medicine

## 2014-09-12 ENCOUNTER — Telehealth: Payer: Self-pay | Admitting: Internal Medicine

## 2014-09-12 MED ORDER — PREDNISONE 10 MG PO TABS: ORAL_TABLET | ORAL | Status: DC

## 2014-09-12 NOTE — Telephone Encounter (Signed)
Spoke with pt, c/o increased wheezing, prod cough with clear mucus X2 weeks, increased sob due to the coughing, PND, sore throat.  denies fever. Uses [facility].   Is requesting recs.  CY please advise.  Thank you!  Allergies  Allergen Reactions  . Tetanus Toxoid     Eyelid edema FROM HORSES; therefore , she was told she could not take Tetanus shot.She has had tetanus shot w/o adverse effect since 2002   Current Outpatient Prescriptions on File Prior to Visit  Medication Sig Dispense Refill  . albuterol (PROVENTIL HFA;VENTOLIN HFA) 108 (90 BASE) MCG/ACT inhaler Inhale 2 puffs into the lungs every 4 (four) hours as needed for wheezing. 1 Inhaler 2  . azithromycin (ZITHROMAX) 250 MG tablet 2 today then one daily 6 each 0  . dextromethorphan-guaiFENesin (MUCINEX DM) 30-600 MG per 12 hr tablet Take 1 tablet by mouth 2 (two) times daily as needed for cough.    . escitalopram (LEXAPRO) 10 MG tablet TAKE ONE half  TABLET EVERY DAY    . HYDROcodone-homatropine (HYDROMET) 5-1.5 MG/5ML syrup Take 5 mLs by mouth every 6 (six) hours as needed. 240 mL 0  . L-Lysine 500 MG TABS Take by mouth as needed.      . mometasone-formoterol (DULERA) 200-5 MCG/ACT AERO 2 puffs then rinse mouth, twice daily- maintenance 13 g 5  . montelukast (SINGULAIR) 10 MG tablet TAKE ONE TABLET AT BEDTIME 30 tablet 5   No current facility-administered medications on file prior to visit.

## 2014-09-12 NOTE — Telephone Encounter (Signed)
Offer prednisone 10 mg, # 20, 4 X 2 DAYS, 3 X 2 DAYS, 2 X 2 DAYS, 1 X 2 DAYS  

## 2014-09-12 NOTE — Telephone Encounter (Signed)
Spoke with pt, she is aware of recs.  Nothing further needed.  

## 2014-09-21 ENCOUNTER — Telehealth: Payer: Self-pay | Admitting: Internal Medicine

## 2014-09-21 ENCOUNTER — Encounter: Payer: Self-pay | Admitting: Adult Health

## 2014-09-21 ENCOUNTER — Ambulatory Visit (INDEPENDENT_AMBULATORY_CARE_PROVIDER_SITE_OTHER): Payer: Medicare Other | Admitting: Adult Health

## 2014-09-21 VITALS — BP 130/70 | HR 67 | Temp 98.0°F | Ht 64.0 in | Wt 137.6 lb

## 2014-09-21 DIAGNOSIS — J45901 Unspecified asthma with (acute) exacerbation: Secondary | ICD-10-CM

## 2014-09-21 MED ORDER — LEVALBUTEROL HCL 0.63 MG/3ML IN NEBU
0.6300 mg | INHALATION_SOLUTION | Freq: Once | RESPIRATORY_TRACT | Status: AC
  Administered 2014-09-21: 0.63 mg via RESPIRATORY_TRACT

## 2014-09-21 MED ORDER — AZITHROMYCIN 250 MG PO TABS: ORAL_TABLET | ORAL | Status: AC

## 2014-09-21 NOTE — Telephone Encounter (Signed)
Discussed with TP: can work pt in a 3pm today, may have to wait.  Thanks.  Called spoke with patient, and advised of the work-in appt.  Pt very grateful and okay with the appt time.  Nothing further needed; will sign off.

## 2014-09-21 NOTE — Patient Instructions (Signed)
Zpack take as directed.  Mucinex DM twice daily as needed. For cough and congestion. Please contact office for sooner follow up if symptoms do not improve or worsen or seek emergency care  Follow up Dr. Annamaria Boots  In 3 months  and As needed

## 2014-09-21 NOTE — Telephone Encounter (Signed)
Pt c/o chest tightness and congestion.  Denies SOB, fever and discolored mucus. Pt took prednisone given 09/12/14 Taking all maint. meds as directed.   Patient requests OV today - wants lungs checked.  If appt not available with CY then with TP Please advise Dr Annamaria Boots. Thanks.   Allergies  Allergen Reactions  . Tetanus Toxoid     Eyelid edema FROM HORSES; therefore , she was told she could not take Tetanus shot.She has had tetanus shot w/o adverse effect since 2002     Medication List       This list is accurate as of: 09/21/14  9:46 AM.  Always use your most recent med list.               albuterol 108 (90 BASE) MCG/ACT inhaler  Commonly known as:  PROVENTIL HFA;VENTOLIN HFA  Inhale 2 puffs into the lungs every 4 (four) hours as needed for wheezing.     azithromycin 250 MG tablet  Commonly known as:  ZITHROMAX  2 today then one daily     dextromethorphan-guaiFENesin 30-600 MG per 12 hr tablet  Commonly known as:  MUCINEX DM  Take 1 tablet by mouth 2 (two) times daily as needed for cough.     escitalopram 10 MG tablet  Commonly known as:  LEXAPRO  TAKE ONE half  TABLET EVERY DAY     HYDROcodone-homatropine 5-1.5 MG/5ML syrup  Commonly known as:  HYDROMET  Take 5 mLs by mouth every 6 (six) hours as needed.     L-Lysine 500 MG Tabs  Take by mouth as needed.     mometasone-formoterol 200-5 MCG/ACT Aero  Commonly known as:  DULERA  2 puffs then rinse mouth, twice daily- maintenance     montelukast 10 MG tablet  Commonly known as:  SINGULAIR  TAKE ONE TABLET AT BEDTIME     predniSONE 10 MG tablet  Commonly known as:  DELTASONE  Take 4 tabs X2 days, then 3 tabs X2 days, then 2 tabs X2 days, then 1 tab X2 days.

## 2014-09-21 NOTE — Progress Notes (Signed)
08/01/13- 57 yoF never smoker  Self referral-asthma; constant cough per patient. Long history of asthma. Was an allergy vaccine patient of Dr. Gerilyn Nestle for several years and says it helped. Cough has been worse for last 5 months, worse in cold weather and better in steam. No other triggers recognized. Cough productive clear phlegm. She denies nasal congestion or drainage, using Nasacort. Chest feels tight. Recognizes GERD especially at night. She describes raspy feeling in her upper airway. Now on a prednisone taper. Has lived in her current house for 3 years with no smokers, gas heat, some carpet, no pets, no mold. Son has asthma. CXR 07/28/13 IMPRESSION:  No active cardiopulmonary disease.  Electronically Signed  By: [name]  On: 07/28/2013 13:57 PFT 10/06/2007- WNL. FVC 2.66/ 88%, FEV1 2.14/ 98%, FEV1/FVC 0.8, TLC 105%, DLCO 98%  10/10/13- 72 yoF never smoker  Self referral-asthma; constant cough per patient. FOLLOWS HQI:ONGEXB is working well for the patient; review labs in detail with patient. Still having a deeper voice than usual. Allergy wise she is doing well-1 sinus episode and 1 time had clogged up sinus area with windy days Feels happy with status, well now. Minimal cough. Allergy Profile 08/01/13- Total IgE 151.4, Broad elevations for many common environmental allergens.  03/29/14- 73 yoF never smoker  Self referral-asthma; constant cough per patient. FOLLOWS FOR:  acute visit today.  congestion and wheezing. much better today.   1 week of acute onset wheeze, sore throat, no fever, clear mucus/ productive cough. She resumed Singulair. Better today. Going to [place] with husb who was on chemoRx for lymphoma.  04/20/2014 Acute OV  Complains of increased SOB, wheezing, prod cough with white/clear mucus, head congestion, bilateral ear congestion, HA x5 days.  Finished zpak yesterday.   Denies any f/c/s, n/v/d, hemoptysis, chest pain, orthopnea, calf pain .  Had mild URI 1 month  ago, resolved shortly and felt much better until 5 days ago.  Says several people on the trip got cold with cough and congestion . Husband has similar symptoms.  On Dulera but out for 2 weeks .  Coughing up thick white mucus . Cough is keeping her up at night and has daytime coughing fit.   04/27/14-73 yoF never smoker followed for asthma, chronic cough, allergic rhinitis ACUTE VISIT: Seen 04-20-14 by TP; fatigued, Chest tightness with wheezing, cough-productive, nasal drainage. Denies any fevers.  Current illness began as a definite bad cold while on bus trip with husband. Seen on 10/2 by TP. Improved some after taking pred taper, augmentin, restarting Dulera, occasional use of cough syrup. Still active cough productive white, nasal congestion with white, persistent soreness L upper anterior chest wall. Denies fever, blood, nodes, palpitation, leg pain or swelling. >zpack   09/21/2014 Acute OV : never smoker , asthma , AR and chronic cough  Patient presents for an acute office visit. Complains of cough, sore throat, nasal congestion, intermittent wheezing, nasal drainage and sinus congestion. She denies any hemoptysis, chest pain, orthopnea, PND or leg swelling Was called in a prednisone pack 2 weeks ago. Feels that it helps some, but she continues to have cough and congestion.    ROS-see HPI Constitutional:   No-   weight loss, night sweats, fevers, chills, fatigue, lassitude. HEENT:   No-  headaches, difficulty swallowing, tooth/dental problems, +sore throat,       No-  sneezing, itching, ear ache, +nasal congestion, +post nasal drip,  CV:  No-   chest pain, orthopnea, PND, swelling in lower extremities,  anasarca,                                  dizziness, palpitations Resp: +  shortness of breath with exertion or at rest.             + productive cough,  + non-productive cough,  No- coughing up of blood.              No-   change in color of mucus.  No- wheezing.   Skin: No-   rash or  lesions. GI:  No- heartburn, indigestion, abdominal pain, nausea, vomiting,  GU:  MS:  No-   joint pain or swelling.   Neuro-     nothing unusual Psych:  No- change in mood or affect. No depression +anxiety.  No memory loss.  OBJ- Physical Exam General- Alert, Oriented, Affect-appropriate, Distress- none acute, trim, alert Skin- rash-none, lesions- none, excoriation- none Lymphadenopathy- none Head- atraumatic            Eyes- Gross vision intact, PERRLA, conjunctivae and secretions clear            Ears- Hearing, canals-normal            Nose- Clear, no-Septal dev, mucus, polyps, erosion, perforation             Throat- Mallampati II , mucosa clear , drainage- none, tonsils- atrophic Neck- flexible , trachea midline, no stridor , thyroid nl, carotid no bruit Chest - symmetrical excursion , unlabored           Heart/CV- RRR , no murmur , no gallop  , no rub, nl s1 s2                           - JVD- none , edema- none, stasis changes- none, varices- none           Lung- CTA w/ no wheezing                                                   dullness-none, rub- none           Chest wall-  Abd-  Br/ Gen/ Rectal- Not done, not indicated Extrem- cyanosis- none, clubbing, none, atrophy- none, strength- nl Neuro- grossly intact to observation   cXR 04/20/2014 >clear

## 2014-09-25 NOTE — Assessment & Plan Note (Signed)
Flare with URI   Plan  Zpack take as directed.  Mucinex DM twice daily as needed. For cough and congestion. Please contact office for sooner follow up if symptoms do not improve or worsen or seek emergency care  Follow up Dr. Annamaria Boots  In 3 months  and As needed

## 2014-10-01 ENCOUNTER — Ambulatory Visit (INDEPENDENT_AMBULATORY_CARE_PROVIDER_SITE_OTHER)
Admission: RE | Admit: 2014-10-01 | Discharge: 2014-10-01 | Disposition: A | Discharge status: Home / Self Care | Payer: Medicare Other | Source: Ambulatory Visit | Attending: Internal Medicine | Admitting: Internal Medicine

## 2014-10-01 ENCOUNTER — Ambulatory Visit (INDEPENDENT_AMBULATORY_CARE_PROVIDER_SITE_OTHER): Payer: Medicare Other | Admitting: Internal Medicine

## 2014-10-01 ENCOUNTER — Encounter: Payer: Self-pay | Admitting: Internal Medicine

## 2014-10-01 ENCOUNTER — Telehealth: Payer: Self-pay | Admitting: Internal Medicine

## 2014-10-01 ENCOUNTER — Other Ambulatory Visit (INDEPENDENT_AMBULATORY_CARE_PROVIDER_SITE_OTHER): Payer: Medicare Other

## 2014-10-01 VITALS — BP 160/98 | HR 60 | Temp 98.6°F | Ht 64.0 in | Wt 136.5 lb

## 2014-10-01 DIAGNOSIS — R030 Elevated blood-pressure reading, without diagnosis of hypertension: Secondary | ICD-10-CM

## 2014-10-01 DIAGNOSIS — R079 Chest pain, unspecified: Secondary | ICD-10-CM

## 2014-10-01 DIAGNOSIS — J4531 Mild persistent asthma with (acute) exacerbation: Secondary | ICD-10-CM

## 2014-10-01 DIAGNOSIS — J310 Chronic rhinitis: Secondary | ICD-10-CM

## 2014-10-01 LAB — CBC WITH DIFFERENTIAL/PLATELET
BASOS ABS: 0.1 10*3/uL (ref 0.0–0.1)
Basophils Relative: 1.1 % (ref 0.0–3.0)
Eosinophils Absolute: 0.2 10*3/uL (ref 0.0–0.7)
Eosinophils Relative: 3.4 % (ref 0.0–5.0)
HCT: 39.5 % (ref 36.0–46.0)
Hemoglobin: 13.5 g/dL (ref 12.0–15.0)
Lymphocytes Relative: 27.8 % (ref 12.0–46.0)
Lymphs Abs: 1.6 10*3/uL (ref 0.7–4.0)
MCHC: 34 g/dL (ref 30.0–36.0)
MCV: 92.3 fl (ref 78.0–100.0)
Monocytes Absolute: 0.4 10*3/uL (ref 0.1–1.0)
Monocytes Relative: 7 % (ref 3.0–12.0)
NEUTROS PCT: 60.7 % (ref 43.0–77.0)
Neutro Abs: 3.4 10*3/uL (ref 1.4–7.7)
Platelets: 247 10*3/uL (ref 150.0–400.0)
RBC: 4.29 Mil/uL (ref 3.87–5.11)
RDW: 13.3 % (ref 11.5–15.5)
WBC: 5.6 10*3/uL (ref 4.0–10.5)

## 2014-10-01 MED ORDER — ALBUTEROL SULFATE HFA 108 (90 BASE) MCG/ACT IN AERS: 2.0000 | INHALATION_SPRAY | Freq: Four times a day (QID) | RESPIRATORY_TRACT | Status: DC | PRN

## 2014-10-01 NOTE — Patient Instructions (Signed)
Minimal Blood Pressure Goal= AVERAGE < 140/90;  Ideal is an AVERAGE < 135/85. This AVERAGE should be calculated from @ least 5-7 BP readings taken @ different times of day on different days of week. You should not respond to isolated BP readings , but rather the AVERAGE for that week .Please bring your  blood pressure cuff to office visits to verify that it is reliable.It  can also be checked against the blood pressure device at the pharmacy. Finger or wrist cuffs are not dependable; an arm cuff is. Plain Mucinex OR DM (NOT D) for thick secretions ;force NON dairy fluids .   Nasal cleansing in the shower as discussed with lather of mild shampoo.After 10 seconds wash off lather while  exhaling through nostrils. Make sure that all residual soap is removed to prevent irritation.   Nasacort AQ 1 spray in each nostril twice a day as needed. Use the "crossover" technique into opposite nostril spraying toward opposite ear @ 45 degree angle, not straight up into nostril.  Plain Allegra (NOT D )  160 daily , Loratidine 10 mg , OR Zyrtec 10 mg @ bedtime  as needed for itchy eyes & sneezing.

## 2014-10-01 NOTE — Progress Notes (Signed)
   Subjective:    Patient ID: Susan Chavez, female    DOB: 03/31/1941, 74 y.o.   MRN: [id number]  HPI She presents with some chest tightness diffusely as well as some discomfort in the left posterior chest worse with deep breathing or coughing. She also has had some hoarseness. Wheezing has been intermittent. Scratchy throat relates to postnasal drainage She has no constitutional, upper respiratory tract infection,or extrinsic symptoms otherwise. She was seen 3/3 & treated with a Z-Pak for asthma exacerbation.  Her major concern is that she will be going to [facility] this week ; she's concerned that she might make patients there ill.  Review of Systems  She specifically denies significant otic pain, otic discharge, frontal headache, facial pain, nasal purulence, itchy/watery eyes, nasal purulence, or dental pain.  She has no history of hypertension. She's not been taking decongestants. She does have the ability to monitor blood pressure at home.    Objective:   Physical Exam  Other than septal dislocation to the left exam was unremarkable  General appearance:Adequately nourished; no acute distress or increased work of breathing is present.  No  lymphadenopathy about the head, neck, or axilla noted.   Eyes: No conjunctival inflammation or lid edema is present. There is no scleral icterus.  Ears:  External ear exam shows no significant lesions or deformities.  Otoscopic examination reveals clear canals, tympanic membranes are intact bilaterally without bulging, retraction, inflammation or discharge.  Nose:  External nasal examination shows no deformity or inflammation. Nasal mucosa are pink and moist without lesions or exudates. No obstruction to airflow.   Oral exam: Dental hygiene is good; lips and gums are healthy appearing.There is no oropharyngeal erythema or exudate noted.   Neck:  No deformities, thyromegaly, masses, or tenderness noted.   Supple with full range of motion  without pain.   Heart:  Normal rate and regular rhythm. S1 and S2 normal without gallop, murmur, click, rub or other extra sounds.   Lungs:Chest clear to auscultation; no wheezes, rhonchi,rales ,or rubs present.  Extremities:  No cyanosis, edema, or clubbing  noted    Skin: Warm & dry w/o jaundice or tenting.       Assessment & Plan:  #1 asthmatic bronchitis; clinically at this time there is no active bronchospasm  #2 she does have some nonallergic rhinitis.  #3 elevated blood pressure without diagnosis of hypertension  Plan: See orders and recommendations

## 2014-10-01 NOTE — Telephone Encounter (Signed)
Pt is aware of CY's recommendations. States that she doesn't have any other symptoms other than what she disclosed earlier. Will come by for CXR and CBC. Nothing further is needed at this time.

## 2014-10-01 NOTE — Progress Notes (Signed)
Pre visit review using our clinic review tool, if applicable. No additional management support is needed unless otherwise documented below in the visit note. 

## 2014-10-01 NOTE — Telephone Encounter (Signed)
Sounds like possible pleurisy. Is there anything else going on? Suggest she come by for CXR, CBC w diff for dx left chest pain Treat for now with Tylenol, Delsym cough syrup, heating pad.Marland Kitchen

## 2014-10-01 NOTE — Telephone Encounter (Signed)
Message closed in error.  CY - please advise.

## 2014-10-01 NOTE — Addendum Note (Signed)
Addended by: Desmond Dike C on: 10/01/2014 09:27 AM   Modules accepted: Orders

## 2014-10-01 NOTE — Telephone Encounter (Signed)
Spoke with pt. States that she would like CY's recommendations. Reports L sided chest pain when breathing in and out, cough with production of white mucus. Increased SOB and chest tightness are also present. Denies fever.  Allergies  Allergen Reactions  . Tetanus Toxoid     Eyelid edema FROM HORSES; therefore , she was told she could not take Tetanus shot.She has had tetanus shot w/o adverse effect since 2002    CY - please advise. Thanks.

## 2014-10-03 NOTE — Progress Notes (Signed)
Quick Note:  lmtcb for pt. ______ 

## 2014-10-03 NOTE — Progress Notes (Signed)
Quick Note:  Pt returned call. Informed pt of the results per CY. Pt verbalized understanding and denied any further questions or concerns at this time. ______

## 2014-10-03 NOTE — Progress Notes (Signed)
Quick Note:  Pt returned call. Informed pt of the results per CY. Pt verbalized understanding and denied any further questions or concerns at this time.   ______

## 2014-10-24 ENCOUNTER — Ambulatory Visit (INDEPENDENT_AMBULATORY_CARE_PROVIDER_SITE_OTHER): Payer: Medicare Other | Admitting: Internal Medicine

## 2014-10-24 ENCOUNTER — Encounter: Payer: Self-pay | Admitting: Internal Medicine

## 2014-10-24 ENCOUNTER — Telehealth: Payer: Self-pay | Admitting: Internal Medicine

## 2014-10-24 VITALS — BP 132/90 | HR 66 | Temp 98.0°F | Resp 15 | Ht 64.0 in | Wt 136.0 lb

## 2014-10-24 DIAGNOSIS — I10 Essential (primary) hypertension: Secondary | ICD-10-CM | POA: Diagnosis not present

## 2014-10-24 MED ORDER — HYDROCHLOROTHIAZIDE 12.5 MG PO CAPS: 12.5000 mg | ORAL_CAPSULE | Freq: Every day | ORAL | Status: DC

## 2014-10-24 NOTE — Progress Notes (Signed)
Pre visit review using our clinic review tool, if applicable. No additional management support is needed unless otherwise documented below in the visit note. 

## 2014-10-24 NOTE — Patient Instructions (Signed)
Minimal Blood Pressure Goal= AVERAGE < 140/90;  Ideal is an AVERAGE < 135/85. This AVERAGE should be calculated from @ least 5-7 BP readings taken @ different times of day on different days of week. You should not respond to isolated BP readings , but rather the AVERAGE for that week .Please bring your  blood pressure cuff to office visits to verify that it is reliable.It  can also be checked against the blood pressure device at the pharmacy.

## 2014-10-24 NOTE — Telephone Encounter (Signed)
emmi emailed °

## 2014-10-24 NOTE — Progress Notes (Signed)
   Subjective:    Patient ID: Susan Chavez, female    DOB: Jan 14, 1941, 74 y.o.   MRN: [id number]  HPI  Her blood pressures range  112/79-158 / 113. The average of almost 20 readings is 135/88. She does restrict salt. She's also tried to restrict caffeine. She was concerned this might be playing a role in her elevated blood pressure.  There is no family history of hypertension or stroke. Her mother had congestive heart failure.  Review of systems is negative except for intermittent left posterior pleuritic chest pain in the context of her reactive airways disease.   Review of Systems   Exertional chest pain, palpitations, tachycardia, exertional dyspnea, paroxysmal nocturnal dyspnea, claudication or edema are absent.       Objective:   Physical Exam Appears healthy and well-nourished & in no acute distress  Arcus senilis present. No carotid bruits are present.No neck vein distention present at 10 - 15 degrees. Thyroid normal to palpation  Heart rhythm and rate are normal with no gallop or murmur.S4  Chest is clear with no increased work of breathing  There is no evidence of aortic aneurysm or renal artery bruits  Abdomen soft with no organomegaly or masses. No HJR  No clubbing, cyanosis or edema present.  Pedal pulses are intact   No ischemic skin changes are present . Fingernails healthy   Alert and oriented. Strength, tone, DTRs reflexes normal        Assessment & Plan:  See Current Assessment & Plan in Problem List under specific Diagnosis

## 2014-10-24 NOTE — Assessment & Plan Note (Signed)
Add HCTZ 12.5

## 2014-10-29 ENCOUNTER — Ambulatory Visit: Payer: Medicare Other | Admitting: Internal Medicine

## 2014-11-20 ENCOUNTER — Telehealth: Payer: Self-pay

## 2014-11-20 NOTE — Telephone Encounter (Signed)
Patient called to educate on Medicare Wellness apt. LVM for the patient to call back to educate and schedule for wellness visit.   

## 2014-11-23 NOTE — Telephone Encounter (Signed)
Placed 2nd call to discuss wellness visit; Left message with spouse to call to learn more about this visit.

## 2014-11-23 NOTE — Telephone Encounter (Signed)
Patient returned your call. They are having a problem with their vm so they won't be able to hear your vm

## 2014-11-23 NOTE — Telephone Encounter (Signed)
Patient returned your phone call.

## 2014-11-26 NOTE — Telephone Encounter (Signed)
Patient has called back.  You can reach her at [phone]

## 2014-11-26 NOTE — Telephone Encounter (Signed)
2nd call to introduce AWV and LVM to call the office.

## 2014-11-27 NOTE — Telephone Encounter (Signed)
Call back and agreed to come in for AWV on 5/16 at 10:15 am

## 2014-12-03 ENCOUNTER — Ambulatory Visit (INDEPENDENT_AMBULATORY_CARE_PROVIDER_SITE_OTHER): Payer: Medicare Other

## 2014-12-03 VITALS — BP 156/81 | Ht 64.0 in | Wt 135.8 lb

## 2014-12-03 DIAGNOSIS — Z0000 Encounter for general adult medical examination without abnormal findings: Secondary | ICD-10-CM

## 2014-12-03 NOTE — Progress Notes (Signed)
Subjective:   Susan Chavez is a 74 y.o. female who presents for Medicare Annual (Subsequent) preventive examination.  Review of Systems:   HRA assessment completed during visit  Health better, the same or worse than last year? Not as much allergy; same Takes a nap when tired; can't work all the time  Health described as excellent; very good; good; fair or poor? Good  Current Exercise; work with a trainer; weights; walk on treadmill x 1 mile. Kettle bells; balance;  Helps with bone loss; osteopenia and now improved Dr. Margaretha Glassing is GYN; following bone health Mammograms; is due and will do that this year. PAP; completed   Current dietary; Eat cereal; rasberries and blackberries Lunch: sandwich; potato salad; pimento cheese; BLT; [employer] chicken soup Supper: Crock pop dinners  Psychosocial changes in the last year; moves; losses of family; Husband has lymphoma x 3 years; and doing  Vision: go to [place]. go back in July or august   Dental: Just went; Q 6 months  Generalized Safety in the home reviewed  Review Firearm safety as appropriate;  Assessed for community safety Emergency Plan for illness or other Driving;  Sun protection: goes to [place] Team reviewed and updated   CC risk: family hx; HTN;   Understand risk and lifestyle choices than can impede health  Personalized Education given regarding:   Pt determined a personalized goal   Assessment included smoking (secondary) educated as appropriate; including LDCT if appropriate    Educated on prevention falls; Exercise, toning and strengthening; Balance exercises  Comfortable shoes     Cardiac Risk Factors include: advanced age (>50men, >3 women);hypertension     Objective:     Vitals: BP 156/81 mmHg  Ht 5\' 4"  (1.626 m)  Wt 135 lb 12 oz (61.576 kg)  BMI 23.29 kg/m2  Tobacco History  Smoking status  . Never Smoker   Smokeless tobacco  . Not on file       Counseling given: Not Answered   Past Medical History  Diagnosis Date  . Colon polyp 2003  . Hyperlipidemia   . Asthma   . Allergic rhinitis     former patient at [facility] Chest with allergy injections  . seizure 1982    had a seizure x 1 74 yo   Past Surgical History  Procedure Laterality Date  . Tonsillectomy    . Colonoscopy    . Patent ductus arterious repair      AGE 37  . Breast surgery      possible tumor-benign   Family History  Problem Relation Age of Onset  . Depression Mother   . Colon cancer Mother   . Depression Sister   . Alcohol abuse Father   . Breast cancer Paternal Grandmother   . Asthma Son   . Allergic rhinitis Son   . Pancreatic cancer Sister     2010  . Other Sister     polio   History  Sexual Activity  . Sexual Activity: Not on file    Outpatient Encounter Prescriptions as of 12/03/2014  Medication Sig  . albuterol (PROVENTIL HFA;VENTOLIN HFA) 108 (90 BASE) MCG/ACT inhaler Inhale 2 puffs into the lungs every 6 (six) hours as needed for wheezing or shortness of breath.  . dextromethorphan-guaiFENesin (MUCINEX DM) 30-600 MG per 12 hr tablet Take 1 tablet by mouth 2 (two) times daily as needed for cough.  . hydrochlorothiazide (MICROZIDE) 12.5 MG capsule Take 1 capsule (12.5  mg total) by mouth daily.  Marland Kitchen HYDROcodone-homatropine (HYDROMET) 5-1.5 MG/5ML syrup Take 5 mLs by mouth every 6 (six) hours as needed.  Marland Kitchen L-Lysine 500 MG TABS Take by mouth as needed.    . mometasone-formoterol (DULERA) 200-5 MCG/ACT AERO 2 puffs then rinse mouth, twice daily- maintenance  . montelukast (SINGULAIR) 10 MG tablet TAKE ONE TABLET AT BEDTIME  . triamcinolone (NASACORT) 55 MCG/ACT AERO nasal inhaler Place 2 sprays into the nose daily.   No facility-administered encounter medications on file as of 12/03/2014.    Activities of Daily Living In your present state of health, do you have any difficulty performing the following activities: 12/03/2014 10/01/2014  Hearing? N  N  Vision? N N  Difficulty concentrating or making decisions? N N  Walking or climbing stairs? N N  Dressing or bathing? N N  Doing errands, shopping? N N  Preparing Food and eating ? N -  Using the Toilet? N -  In the past six months, have you accidently leaked urine? N -  Do you have problems with loss of bowel control? N -  Managing your Medications? N -  Managing your Finances? N -  Housekeeping or managing your Housekeeping? N -    Patient Care Team: Hendricks Limes, MD as PCP - General    Assessment:    Objective:  VS:  BMI:   Understand risk and lifestyle choices than can impede health: (CVD; Metabolic syndrome; functional decline;   Personalized Education given regarding:   Pt determined a personalized goal   Assessment included: smoking (secondary) educated as appropriate; including LDCT if appropriate Bone density scan as appropriate Calcium and Vit D as appropriate/ Osteoporosis risk reviewed Taking meds without issues; no barriers identified Stress: Recommendations for managing stress if assessed as a factor;  No Risk for hepatitis or high risk social behavior identified via hepatitis screen Educated on shingles and follow up with insurance company for co-pays or charges applied to Part D benefit.  Safety issues reviewed(driving issues; vision; home environment; support and environmental safety)  Cognition assessed by AD8; Score 0 (A score of 2 or greater would indicate the MMSE be completed)    Need for Immunizations or other screenings identified;  (CDC recommmend Prevnar at 65 followed by pnuemovax 23 in one year or 5 years after the last dose.  Health Maintenance up to date and a Preventive Wellness Plan was given to the patient    Exercise Activities and Dietary recommendations Current Exercise Habits:: Structured exercise class (has personal trainer), Type of exercise: strength training/weights;treadmill;walking, Time (Minutes): 60, Frequency  (Times/Week): 3 (walks less than a 15 minutes miles), Weekly Exercise (Minutes/Week): 180, Intensity: Moderate  Goals    . <enter goal here>     Recommended some type of relaxation;  Discussed dexa scan The last dexa stated repeat x 2 years (2015) Will review with GYN since Dr. Margaretha Glassing follows Also continue with core exercises        Fall Risk Fall Risk  12/03/2014 10/01/2014 07/28/2013  Falls in the past year? No No No   Depression Screen PHQ 2/9 Scores 12/03/2014 10/01/2014 07/28/2013  PHQ - 2 Score 0 0 0     Cognitive Testing MMSE - Mini Mental State Exam 12/03/2014  Not completed: (No Data)    Immunization History  Administered Date(s) Administered  . Influenza Split 06/15/2011, 05/20/2012, 03/13/2014  . Influenza Whole 04/29/2010  . Influenza,inj,Quad PF,36+ Mos 04/12/2013, 05/10/2014  . Pneumococcal Conjugate-13 04/30/2014  . Pneumococcal  Polysaccharide-23 08/02/2003  . Zoster 07/28/2010   Screening Tests Health Maintenance  Topic Date Due  . TETANUS/TDAP  01/05/1959  . MAMMOGRAM  12/21/2013  . INFLUENZA VACCINE  02/18/2015  . PNA vac Low Risk Adult (2 of 2 - PPSV23) 05/01/2015  . COLONOSCOPY  11/15/2022  . DEXA SCAN  Completed  . ZOSTAVAX  Completed      Plan:    Plan   The patient agrees to:  Mammogram this year DEXA Per GYN  Fup BP Will become salt conscious Has cut caffeine Will make apt with Dr. Linna Darner if BP stays > 140/80  Advanced directive: Yes; spouse is a Chief Executive Officer    Commit to Goals set;      During the course of the visit the patient was educated and counseled about the following appropriate screening and preventive services:   Vaccines to include Pneumoccal, Influenza, Hepatitis B, Td, Zostavax, HCV/ none due but allergic to tetanus  Electrocardiogram;  Not due  Cardiovascular Disease/ risk just HTN   Colorectal cancer screening/ Colonoscopy not due  Bone density screening will do this year and send Dr. Linna Darner the  report  Diabetes screening/ na/  Glaucoma screening/ eye visit q tear  Mammography/PAP/ this year  Nutrition counseling   Patient Instructions (the written plan) was given to the patient.   Wynetta Fines, RN  12/03/2014

## 2014-12-03 NOTE — Patient Instructions (Addendum)
Susan Chavez , Thank you for taking time to come for your Medicare Wellness Visit. I appreciate your ongoing commitment to your health goals. Please review the following plan we discussed and let me know if I can assist you in the future.   These are the goals we discussed: Goals    . <enter goal here>     Recommended some type of relaxation;  Discussed dexa scan The last dexa stated repeat x 2 years (2015) Will review with GYN since Dr. Margaretha Glassing follows Also continue with core exercises         This is a list of the screening recommended for you and due dates:  Health Maintenance  Topic Date Due  . Tetanus Vaccine  01/05/1960  . Mammogram  12/21/2013  . Flu Shot  02/18/2015  . Pneumonia vaccines (2 of 2 - PPSV23) 05/01/2015  . Colon Cancer Screening  11/15/2022  . DEXA scan (bone density measurement)  Completed  . Shingles Vaccine  Completed    Osteoporosis Throughout your life, your body breaks down old bone and replaces it with new bone. As you get older, your body does not replace bone as quickly as it breaks it down. By the age of 4 years, most people begin to gradually lose bone because of the imbalance between bone loss and replacement. Some people lose more bone than others. Bone loss beyond a specified normal degree is considered osteoporosis.  Osteoporosis affects the strength and durability of your bones. The inside of the ends of your bones and your flat bones, like the bones of your pelvis, look like honeycomb, filled with tiny open spaces. As bone loss occurs, your bones become less dense. This means that the open spaces inside your bones become bigger and the walls between these spaces become thinner. This makes your bones weaker. Bones of a person with osteoporosis can become so weak that they can break (fracture) during minor accidents, such as a simple fall. CAUSES  The following factors have been associated with the development of  osteoporosis:  Smoking.  Drinking more than 2 alcoholic drinks several days per week.  Long-term use of certain medicines:  Corticosteroids.  Chemotherapy medicines.  Thyroid medicines.  Antiepileptic medicines.  Gonadal hormone suppression medicine.  Immunosuppression medicine.  Being underweight.  Lack of physical activity.  Lack of exposure to the sun. This can lead to vitamin D deficiency.  Certain medical conditions:  Certain inflammatory bowel diseases, such as Crohn disease and ulcerative colitis.  Diabetes.  Hyperthyroidism.  Hyperparathyroidism. RISK FACTORS Anyone can develop osteoporosis. However, the following factors can increase your risk of developing osteoporosis:  Gender--Women are at higher risk than men.  Age--Being older than 50 years increases your risk.  Ethnicity--White and Asian people have an increased risk.  Weight --Being extremely underweight can increase your risk of osteoporosis.  Family history of osteoporosis--Having a family member who has developed osteoporosis can increase your risk. SYMPTOMS  Usually, people with osteoporosis have no symptoms.  DIAGNOSIS  Signs during a physical exam that may prompt your caregiver to suspect osteoporosis include:  Decreased height. This is usually caused by the compression of the bones that form your spine (vertebrae) because they have weakened and become fractured.  A curving or rounding of the upper back (kyphosis). To confirm signs of osteoporosis, your caregiver may request a procedure that uses 2 low-dose X-ray beams with different levels of energy to measure your bone mineral density (dual-energy X-ray absorptiometry [DXA]). Also, your  caregiver may check your level of vitamin D. TREATMENT  The goal of osteoporosis treatment is to strengthen bones in order to decrease the risk of bone fractures. There are different types of medicines available to help achieve this goal. Some of these  medicines work by slowing the processes of bone loss. Some medicines work by increasing bone density. Treatment also involves making sure that your levels of calcium and vitamin D are adequate. PREVENTION  There are things you can do to help prevent osteoporosis. Adequate intake of calcium and vitamin D can help you achieve optimal bone mineral density. Regular exercise can also help, especially resistance and weight-bearing activities. If you smoke, quitting smoking is an important part of osteoporosis prevention. MAKE SURE YOU:  Understand these instructions.  Will watch your condition.  Will get help right away if you are not doing well or get worse. FOR MORE INFORMATION www.osteo.org and EquipmentWeekly.com.ee Document Released: 04/15/2005 Document Revised: 10/31/2012 Document Reviewed: 06/20/2011 [facility] Patient Information 2015 [place], [place]. This information is not intended to replace advice given to you by your health care provider. Make sure you discuss any questions you have with your health care provider. Low-Sodium Eating Plan Sodium raises blood pressure and causes water to be held in the body. Getting less sodium from food will help lower your blood pressure, reduce any swelling, and protect your heart, liver, and kidneys. We get sodium by adding salt (sodium chloride) to food. Most of our sodium comes from canned, boxed, and frozen foods. Restaurant foods, fast foods, and pizza are also very high in sodium. Even if you take medicine to lower your blood pressure or to reduce fluid in your body, getting less sodium from your food is important. WHAT IS MY PLAN? Most people should limit their sodium intake to 2,300 mg a day. Your health care provider recommends that you limit your sodium intake to __________ a day.  WHAT DO I NEED TO KNOW ABOUT THIS EATING PLAN? For the low-sodium eating plan, you will follow these general guidelines:  Choose foods with a % Daily Value for sodium of less than  5% (as listed on the food label).   Use salt-free seasonings or herbs instead of table salt or sea salt.   Check with your health care provider or pharmacist before using salt substitutes.   Eat fresh foods.  Eat more vegetables and fruits.  Limit canned vegetables. If you do use them, rinse them well to decrease the sodium.   Limit cheese to 1 oz (28 g) per day.   Eat lower-sodium products, often labeled as "lower sodium" or "no salt added."  Avoid foods that contain monosodium glutamate (MSG). MSG is sometimes added to [place] food and some canned foods.  Check food labels (Nutrition Facts labels) on foods to learn how much sodium is in one serving.  Eat more home-cooked food and less restaurant, buffet, and fast food.  When eating at a restaurant, ask that your food be prepared with less salt or none, if possible.  HOW DO I READ FOOD LABELS FOR SODIUM INFORMATION? The Nutrition Facts label lists the amount of sodium in one serving of the food. If you eat more than one serving, you must multiply the listed amount of sodium by the number of servings. Food labels may also identify foods as:  Sodium free--Less than 5 mg in a serving.  Very low sodium--35 mg or less in a serving.  Low sodium--140 mg or less in a serving.  Light  in sodium--50% less sodium in a serving. For example, if a food that usually has 300 mg of sodium is changed to become light in sodium, it will have 150 mg of sodium.  Reduced sodium--25% less sodium in a serving. For example, if a food that usually has 400 mg of sodium is changed to reduced sodium, it will have 300 mg of sodium. WHAT FOODS CAN I EAT? Grains Low-sodium cereals, including oats, puffed wheat and rice, and shredded wheat cereals. Low-sodium crackers. Unsalted rice and pasta. Lower-sodium bread.  Vegetables Frozen or fresh vegetables. Low-sodium or reduced-sodium canned vegetables. Low-sodium or reduced-sodium tomato sauce and  paste. Low-sodium or reduced-sodium tomato and vegetable juices.  Fruits Fresh, frozen, and canned fruit. Fruit juice.  Meat and Other Protein Products Low-sodium canned tuna and salmon. Fresh or frozen meat, poultry, seafood, and fish. Lamb. Unsalted nuts. Dried beans, peas, and lentils without added salt. Unsalted canned beans. Homemade soups without salt. Eggs.  Dairy Milk. Soy milk. Ricotta cheese. Low-sodium or reduced-sodium cheeses. Yogurt.  Condiments Fresh and dried herbs and spices. Salt-free seasonings. Onion and garlic powders. Low-sodium varieties of mustard and ketchup. Lemon juice.  Fats and Oils Reduced-sodium salad dressings. Unsalted butter.  Other Unsalted popcorn and pretzels.  The items listed above may not be a complete list of recommended foods or beverages. Contact your dietitian for more options. WHAT FOODS ARE NOT RECOMMENDED? Grains Instant hot cereals. Bread stuffing, pancake, and biscuit mixes. Croutons. Seasoned rice or pasta mixes. Noodle soup cups. Boxed or frozen macaroni and cheese. Self-rising flour. Regular salted crackers. Vegetables Regular canned vegetables. Regular canned tomato sauce and paste. Regular tomato and vegetable juices. Frozen vegetables in sauces. Salted french fries. Olives. Angie Fava. Relishes. Sauerkraut. Salsa. Meat and Other Protein Products Salted, canned, smoked, spiced, or pickled meats, seafood, or fish. Bacon, ham, sausage, hot dogs, corned beef, chipped beef, and packaged luncheon meats. Salt pork. Jerky. Pickled herring. Anchovies, regular canned tuna, and sardines. Salted nuts. Dairy Processed cheese and cheese spreads. Cheese curds. Blue cheese and cottage cheese. Buttermilk.  Condiments Onion and garlic salt, seasoned salt, table salt, and sea salt. Canned and packaged gravies. Worcestershire sauce. Tartar sauce. Barbecue sauce. Teriyaki sauce. Soy sauce, including reduced sodium. Steak sauce. Fish sauce. Oyster  sauce. Cocktail sauce. Horseradish. Regular ketchup and mustard. Meat flavorings and tenderizers. Bouillon cubes. Hot sauce. Tabasco sauce. Marinades. Taco seasonings. Relishes. Fats and Oils Regular salad dressings. Salted butter. Margarine. Ghee. Bacon fat.  Other Potato and tortilla chips. Corn chips and puffs. Salted popcorn and pretzels. Canned or dried soups. Pizza. Frozen entrees and pot pies.  The items listed above may not be a complete list of foods and beverages to avoid. Contact your dietitian for more information. Document Released: 12/26/2001 Document Revised: 07/11/2013 Document Reviewed: 05/10/2013 [facility] Patient Information 2015 [place], [place]. This information is not intended to replace advice given to you by your health care provider. Make sure you discuss any questions you have with your health care provider.   Health Maintenance Adopting a healthy lifestyle and getting preventive care can go a long way to promote health and wellness. Talk with your health care provider about what schedule of regular examinations is right for you. This is a good chance for you to check in with your provider about disease prevention and staying healthy. In between checkups, there are plenty of things you can do on your own. Experts have done a lot of research about which lifestyle changes and preventive measures are most  likely to keep you healthy. Ask your health care provider for more information. WEIGHT AND DIET  Eat a healthy diet  Be sure to include plenty of vegetables, fruits, low-fat dairy products, and lean protein.  Do not eat a lot of foods high in solid fats, added sugars, or salt.  Get regular exercise. This is one of the most important things you can do for your health.  Most adults should exercise for at least 150 minutes each week. The exercise should increase your heart rate and make you sweat (moderate-intensity exercise).  Most adults should also do strengthening  exercises at least twice a week. This is in addition to the moderate-intensity exercise.  Maintain a healthy weight  Body mass index (BMI) is a measurement that can be used to identify possible weight problems. It estimates body fat based on height and weight. Your health care provider can help determine your BMI and help you achieve or maintain a healthy weight.  For females 55 years of age and older:   A BMI below 18.5 is considered underweight.  A BMI of 18.5 to 24.9 is normal.  A BMI of 25 to 29.9 is considered overweight.  A BMI of 30 and above is considered obese.  Watch levels of cholesterol and blood lipids  You should start having your blood tested for lipids and cholesterol at 75 years of age, then have this test every 5 years.  You may need to have your cholesterol levels checked more often if:  Your lipid or cholesterol levels are high.  You are older than 75 years of age.  You are at high risk for heart disease.  CANCER SCREENING   Lung Cancer  Lung cancer screening is recommended for adults 44-61 years old who are at high risk for lung cancer because of a history of smoking.  A yearly low-dose CT scan of the lungs is recommended for people who:  Currently smoke.  Have quit within the past 15 years.  Have at least a 30-pack-year history of smoking. A pack year is smoking an average of one pack of cigarettes a day for 1 year.  Yearly screening should continue until it has been 15 years since you quit.  Yearly screening should stop if you develop a health problem that would prevent you from having lung cancer treatment.  Breast Cancer  Practice breast self-awareness. This means understanding how your breasts normally appear and feel.  It also means doing regular breast self-exams. Let your health care provider know about any changes, no matter how small.  If you are in your 20s or 30s, you should have a clinical breast exam (CBE) by a health care  provider every 1-3 years as part of a regular health exam.  If you are 78 or older, have a CBE every year. Also consider having a breast X-ray (mammogram) every year.  If you have a family history of breast cancer, talk to your health care provider about genetic screening.  If you are at high risk for breast cancer, talk to your health care provider about having an MRI and a mammogram every year.  Breast cancer gene (BRCA) assessment is recommended for women who have family members with BRCA-related cancers. BRCA-related cancers include:  Breast.  Ovarian.  Tubal.  Peritoneal cancers.  Results of the assessment will determine the need for genetic counseling and BRCA1 and BRCA2 testing. Cervical Cancer Routine pelvic examinations to screen for cervical cancer are no longer recommended for nonpregnant women  who are considered low risk for cancer of the pelvic organs (ovaries, uterus, and vagina) and who do not have symptoms. A pelvic examination may be necessary if you have symptoms including those associated with pelvic infections. Ask your health care provider if a screening pelvic exam is right for you.   The Pap test is the screening test for cervical cancer for women who are considered at risk.  If you had a hysterectomy for a problem that was not cancer or a condition that could lead to cancer, then you no longer need Pap tests.  If you are older than 65 years, and you have had normal Pap tests for the past 10 years, you no longer need to have Pap tests.  If you have had past treatment for cervical cancer or a condition that could lead to cancer, you need Pap tests and screening for cancer for at least 20 years after your treatment.  If you no longer get a Pap test, assess your risk factors if they change (such as having a new sexual partner). This can affect whether you should start being screened again.  Some women have medical problems that increase their chance of getting  cervical cancer. If this is the case for you, your health care provider may recommend more frequent screening and Pap tests.  The human papillomavirus (HPV) test is another test that may be used for cervical cancer screening. The HPV test looks for the virus that can cause cell changes in the cervix. The cells collected during the Pap test can be tested for HPV.  The HPV test can be used to screen women 75 years of age and older. Getting tested for HPV can extend the interval between normal Pap tests from three to five years.  An HPV test also should be used to screen women of any age who have unclear Pap test results.  After 75 years of age, women should have HPV testing as often as Pap tests.  Colorectal Cancer  This type of cancer can be detected and often prevented.  Routine colorectal cancer screening usually begins at 75 years of age and continues through 75 years of age.  Your health care provider may recommend screening at an earlier age if you have risk factors for colon cancer.  Your health care provider may also recommend using home test kits to check for hidden blood in the stool.  A small camera at the end of a tube can be used to examine your colon directly (sigmoidoscopy or colonoscopy). This is done to check for the earliest forms of colorectal cancer.  Routine screening usually begins at age 75.  Direct examination of the colon should be repeated every 5-10 years through 75 years of age. However, you may need to be screened more often if early forms of precancerous polyps or small growths are found. Skin Cancer  Check your skin from head to toe regularly.  Tell your health care provider about any new moles or changes in moles, especially if there is a change in a mole's shape or color.  Also tell your health care provider if you have a mole that is larger than the size of a pencil eraser.  Always use sunscreen. Apply sunscreen liberally and repeatedly throughout the  day.  Protect yourself by wearing long sleeves, pants, a wide-brimmed hat, and sunglasses whenever you are outside. HEART DISEASE, DIABETES, AND HIGH BLOOD PRESSURE   Have your blood pressure checked at least every 1-2 years. High  blood pressure causes heart disease and increases the risk of stroke.  If you are between 49 years and 60 years old, ask your health care provider if you should take aspirin to prevent strokes.  Have regular diabetes screenings. This involves taking a blood sample to check your fasting blood sugar level.  If you are at a normal weight and have a low risk for diabetes, have this test once every three years after 75 years of age.  If you are overweight and have a high risk for diabetes, consider being tested at a younger age or more often. PREVENTING INFECTION  Hepatitis B  If you have a higher risk for hepatitis B, you should be screened for this virus. You are considered at high risk for hepatitis B if:  You were born in a country where hepatitis B is common. Ask your health care provider which countries are considered high risk.  Your parents were born in a high-risk country, and you have not been immunized against hepatitis B (hepatitis B vaccine).  You have HIV or AIDS.  You use needles to inject street drugs.  You live with someone who has hepatitis B.  You have had sex with someone who has hepatitis B.  You get hemodialysis treatment.  You take certain medicines for conditions, including cancer, organ transplantation, and autoimmune conditions. Hepatitis C  Blood testing is recommended for:  Everyone born from [birth year] through 1966.  Anyone with known risk factors for hepatitis C. Sexually transmitted infections (STIs)  You should be screened for sexually transmitted infections (STIs) including gonorrhea and chlamydia if:  You are sexually active and are younger than 75 years of age.  You are older than 75 years of age and your health care  provider tells you that you are at risk for this type of infection.  Your sexual activity has changed since you were last screened and you are at an increased risk for chlamydia or gonorrhea. Ask your health care provider if you are at risk.  If you do not have HIV, but are at risk, it may be recommended that you take a prescription medicine daily to prevent HIV infection. This is called pre-exposure prophylaxis (PrEP). You are considered at risk if:  You are sexually active and do not regularly use condoms or know the HIV status of your partner(s).  You take drugs by injection.  You are sexually active with a partner who has HIV. Talk with your health care provider about whether you are at high risk of being infected with HIV. If you choose to begin PrEP, you should first be tested for HIV. You should then be tested every 3 months for as long as you are taking PrEP.  PREGNANCY   If you are premenopausal and you may become pregnant, ask your health care provider about preconception counseling.  If you may become pregnant, take 400 to 800 micrograms (mcg) of folic acid every day.  If you want to prevent pregnancy, talk to your health care provider about birth control (contraception). OSTEOPOROSIS AND MENOPAUSE   Osteoporosis is a disease in which the bones lose minerals and strength with aging. This can result in serious bone fractures. Your risk for osteoporosis can be identified using a bone density scan.  If you are 66 years of age or older, or if you are at risk for osteoporosis and fractures, ask your health care provider if you should be screened.  Ask your health care provider whether you should take  a calcium or vitamin D supplement to lower your risk for osteoporosis.  Menopause may have certain physical symptoms and risks.  Hormone replacement therapy may reduce some of these symptoms and risks. Talk to your health care provider about whether hormone replacement therapy is  right for you.  HOME CARE INSTRUCTIONS   Schedule regular health, dental, and eye exams.  Stay current with your immunizations.   Do not use any tobacco products including cigarettes, chewing tobacco, or electronic cigarettes.  If you are pregnant, do not drink alcohol.  If you are breastfeeding, limit how much and how often you drink alcohol.  Limit alcohol intake to no more than 1 drink per day for nonpregnant women. One drink equals 12 ounces of beer, 5 ounces of wine, or 1 ounces of hard liquor.  Do not use street drugs.  Do not share needles.  Ask your health care provider for help if you need support or information about quitting drugs.  Tell your health care provider if you often feel depressed.  Tell your health care provider if you have ever been abused or do not feel safe at home. Document Released: 01/19/2011 Document Revised: 11/20/2013 Document Reviewed: 06/07/2013 [facility] Patient Information 2015 [place], [place]. This information is not intended to replace advice given to you by your health care provider. Make sure you discuss any questions you have with your health care provider.

## 2014-12-04 ENCOUNTER — Telehealth: Payer: Self-pay

## 2014-12-04 NOTE — Telephone Encounter (Signed)
AWV 5/16; This is added note regarding tetanus. 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

## 2014-12-04 NOTE — Progress Notes (Signed)
   Subjective:    Patient ID: Susan Chavez, female    DOB: 04-08-1941, 74 y.o.   MRN: [id number]  HPI    Review of Systems     Objective:   Physical Exam        Assessment & Plan:  Medical screening examination/treatment/procedure(s) were performed by non-physician practitioner and as supervising physician I was immediately available for consultation/collaboration. I agree with above. Unice Cobble, MD

## 2014-12-19 ENCOUNTER — Telehealth: Payer: Self-pay | Admitting: Geriatric Medicine

## 2014-12-19 NOTE — Telephone Encounter (Signed)
Left message for patient to call back to let me know if she has had a mammogram recently.

## 2015-01-18 ENCOUNTER — Ambulatory Visit (INDEPENDENT_AMBULATORY_CARE_PROVIDER_SITE_OTHER): Payer: Medicare Other | Admitting: Internal Medicine

## 2015-01-18 ENCOUNTER — Encounter: Payer: Self-pay | Admitting: Internal Medicine

## 2015-01-18 ENCOUNTER — Other Ambulatory Visit: Payer: Self-pay | Admitting: Emergency Medicine

## 2015-01-18 VITALS — BP 124/78 | HR 69 | Temp 98.2°F | Resp 16 | Wt 130.0 lb

## 2015-01-18 DIAGNOSIS — M810 Age-related osteoporosis without current pathological fracture: Secondary | ICD-10-CM | POA: Insufficient documentation

## 2015-01-18 DIAGNOSIS — I10 Essential (primary) hypertension: Secondary | ICD-10-CM

## 2015-01-18 DIAGNOSIS — Z86010 Personal history of colonic polyps: Secondary | ICD-10-CM | POA: Diagnosis not present

## 2015-01-18 DIAGNOSIS — E785 Hyperlipidemia, unspecified: Secondary | ICD-10-CM | POA: Diagnosis not present

## 2015-01-18 DIAGNOSIS — M8580 Other specified disorders of bone density and structure, unspecified site: Secondary | ICD-10-CM | POA: Diagnosis not present

## 2015-01-18 MED ORDER — ESCITALOPRAM OXALATE 10 MG PO TABS: ORAL_TABLET | ORAL | Status: DC

## 2015-01-18 MED ORDER — HYDROCHLOROTHIAZIDE 12.5 MG PO CAPS: 12.5000 mg | ORAL_CAPSULE | Freq: Every day | ORAL | Status: DC

## 2015-01-18 MED ORDER — MONTELUKAST SODIUM 10 MG PO TABS: 10.0000 mg | ORAL_TABLET | Freq: Every day | ORAL | Status: DC

## 2015-01-18 NOTE — Assessment & Plan Note (Signed)
NMR Lipoprofile, LFTs, TSH  

## 2015-01-18 NOTE — Assessment & Plan Note (Signed)
CBC 3/14 WNL

## 2015-01-18 NOTE — Progress Notes (Signed)
Pre visit review using our clinic review tool, if applicable. No additional management support is needed unless otherwise documented below in the visit note. 

## 2015-01-18 NOTE — Assessment & Plan Note (Signed)
Vit D level.

## 2015-01-18 NOTE — Assessment & Plan Note (Signed)
Blood pressure goals reviewed. BMET 

## 2015-01-18 NOTE — Patient Instructions (Signed)
Minimal Blood Pressure Goal= AVERAGE < 140/90;  Ideal is an AVERAGE < 135/85. This AVERAGE should be calculated from @ least 5-7 BP readings taken @ different times of day on different days of week. You should not respond to isolated BP readings , but rather the AVERAGE for that week .Please bring your  blood pressure cuff to office visits to verify that it is reliable.It  can also be checked against the blood pressure device at the pharmacy. Finger or wrist cuffs are not dependable; an arm cuff is. Colonoscopy due 2019 Your next office appointment will be determined based upon review of your pending labs. Those written interpretation of the lab results and instructions will be transmitted to you by My Chart Critical results will be called. BMD through Gyn evry 25 months. Followup as needed for any active or acute issue. Please report any significant change in your symptoms.

## 2015-01-18 NOTE — Progress Notes (Signed)
   Subjective:    Patient ID: Susan Chavez, female    DOB: 1941-02-21, 74 y.o.   MRN: [id number]  HPI The patient is here to assess status of active health conditions.  PMH, FH, & Social History reviewed & updated.  She is on a heart healthy diet. She has 1 alcoholic beverage a day. She's never smoked. She works with a Clinical research associate twice a week and walks almost daily without cardiopulmonary symptoms except for exertional dyspnea. This is exertional dyspnea in the context of asthma. Her asthma is well controlled otherwise with a maintenance drug. She is not using her rescue inhaler.  She has been having some anxiety recently with difficulty going to sleep and with early awakening. She denies anorexia, panic attacks, or frank depression. She is requesting a refill of her generic Lexapro.  She also has nocturia twice nightly but does drink increased amounts of fluid.  Her colonoscopy is up-to-date. She had a normal CBC in March of this year. She is due for colonoscopy 2019. She has no active GI symptoms. Her mother did have colon cancer. Her sister had pancreatic cancer.    Review of Systems Chest pain, palpitations, tachycardia,  paroxysmal nocturnal dyspnea, claudication or edema are absent. No unexplained weight loss, abdominal pain, significant dyspepsia, dysphagia, melena, rectal bleeding, or persistently small caliber stools. Dysuria, pyuria, hematuria, frequency, nocturia or polyuria are denied. Change in hair, skin, nails denied. No bowel changes of constipation or diarrhea. No intolerance to heat or cold.    Objective:   Physical Exam Pertinent or positive findings include: Bilateral ptosis is present. She has arcus senilis.Grade 5/6-3 systolic murmur. She has minimal instability of the knees without significant crepitus.  General appearance : Thin but adequately nourished; in no distress.  Eyes: No conjunctival inflammation or scleral icterus is present.  Oral exam:  Lips and gums  are healthy appearing.There is no oropharyngeal erythema or exudate noted. Dental hygiene is good.  Heart:  Normal rate and regular rhythm. S1 and S2 normal without gallop,  click, rub or other extra sounds    Lungs:Chest clear to auscultation; no wheezes, rhonchi,rales ,or rubs present.No increased work of breathing.   Abdomen: bowel sounds normal, soft and non-tender without masses, organomegaly or hernias noted.  No guarding or rebound. No flank tenderness to percussion.  Vascular : all pulses equal ; no bruits present.  Skin:Warm & dry.  Intact without suspicious lesions or rashes ; no tenting or jaundice   Lymphatic: No lymphadenopathy is noted about the head, neck, axilla   Neuro: Strength, tone & DTRs normal.      Assessment & Plan:  See Current Assessment & Plan in Problem List under specific Diagnosis

## 2015-01-23 ENCOUNTER — Other Ambulatory Visit: Payer: Self-pay | Admitting: Internal Medicine

## 2015-01-23 ENCOUNTER — Other Ambulatory Visit (INDEPENDENT_AMBULATORY_CARE_PROVIDER_SITE_OTHER): Payer: Medicare Other

## 2015-01-23 DIAGNOSIS — E785 Hyperlipidemia, unspecified: Secondary | ICD-10-CM

## 2015-01-23 DIAGNOSIS — M8580 Other specified disorders of bone density and structure, unspecified site: Secondary | ICD-10-CM

## 2015-01-23 DIAGNOSIS — I10 Essential (primary) hypertension: Secondary | ICD-10-CM

## 2015-01-23 LAB — BASIC METABOLIC PANEL
BUN: 12 mg/dL (ref 6–23)
CALCIUM: 10 mg/dL (ref 8.4–10.5)
CO2: 32 mEq/L (ref 19–32)
Chloride: 94 mEq/L — ABNORMAL LOW (ref 96–112)
Creatinine, Ser: 0.85 mg/dL (ref 0.40–1.20)
GFR: 69.29 mL/min (ref >60.00)
GLUCOSE: 90 mg/dL (ref 70–99)
POTASSIUM: 3.7 meq/L (ref 3.5–5.1)
SODIUM: 134 meq/L — AB (ref 135–145)

## 2015-01-23 LAB — HEPATIC FUNCTION PANEL
ALBUMIN: 4.4 g/dL (ref 3.5–5.2)
ALT: 14 U/L (ref 0–35)
AST: 22 U/L (ref 0–37)
Alkaline Phosphatase: 50 U/L (ref 39–117)
Bilirubin, Direct: 0.1 mg/dL (ref 0.0–0.3)
TOTAL PROTEIN: 7.4 g/dL (ref 6.0–8.3)
Total Bilirubin: 0.4 mg/dL (ref 0.2–1.2)

## 2015-01-23 LAB — TSH: TSH: 1.79 u[IU]/mL (ref 0.35–4.50)

## 2015-01-23 LAB — VITAMIN D 25 HYDROXY (VIT D DEFICIENCY, FRACTURES): VITD: 28.79 ng/mL — ABNORMAL LOW (ref 30.00–100.00)

## 2015-01-25 LAB — NMR LIPOPROFILE WITH LIPIDS
Cholesterol, Total: 260 mg/dL — ABNORMAL HIGH (ref 100–199)
HDL Particle Number: 46.2 umol/L (ref >=30.5)
HDL Size: 10.3 nm (ref >=9.2)
HDL-C: 116 mg/dL (ref >39)
LDL (calc): 116 mg/dL — ABNORMAL HIGH (ref 0–99)
LDL PARTICLE NUMBER: 1290 nmol/L — AB (ref <1000)
LDL SIZE: 21.9 nm (ref >=20.8)
LP-IR Score: <25 (ref <=45)
Large HDL-P: >20 umol/L (ref >=4.8)
Large VLDL-P: 3.6 nmol/L — ABNORMAL HIGH (ref <=2.7)
Triglycerides: 138 mg/dL (ref 0–149)
VLDL SIZE: 50.1 nm — AB (ref <=46.6)

## 2015-03-04 ENCOUNTER — Telehealth: Payer: Self-pay | Admitting: Internal Medicine

## 2015-03-04 MED ORDER — AZITHROMYCIN 250 MG PO TABS: ORAL_TABLET | ORAL | Status: DC

## 2015-03-04 NOTE — Telephone Encounter (Signed)
Offer Zpak 

## 2015-03-04 NOTE — Telephone Encounter (Signed)
Called spoke with pt and is aware of recs. RX sent in. Nothing further needed

## 2015-03-04 NOTE — Telephone Encounter (Signed)
Spoke with pt. C/o prod cough (white-yellow phlem), sinus pressure under eyes, bilateral ear pain, throat feels raw, PND, nasal cong x couple days. She has been taking ibuprofen, Singulair, dulera and nasacort. Please advise Dr. Annamaria Boots thanks  Allergies  Allergen Reactions  . Tetanus Toxoid     Eyelid edema FROM HORSES; therefore , she was told she could not take Tetanus shot.She has had tetanus shot w/o adverse effect since 2002     Current Outpatient Prescriptions on File Prior to Visit  Medication Sig Dispense Refill  . albuterol (PROVENTIL HFA;VENTOLIN HFA) 108 (90 BASE) MCG/ACT inhaler Inhale 2 puffs into the lungs every 6 (six) hours as needed for wheezing or shortness of breath. (Patient not taking: Reported on 01/18/2015) 1 Inhaler 2  . dextromethorphan-guaiFENesin (MUCINEX DM) 30-600 MG per 12 hr tablet Take 1 tablet by mouth 2 (two) times daily as needed for cough.    . escitalopram (LEXAPRO) 10 MG tablet 1/2-1 qd 90 tablet 1  . hydrochlorothiazide (MICROZIDE) 12.5 MG capsule Take 1 capsule (12.5 mg total) by mouth daily. 30 capsule 5  . HYDROcodone-homatropine (HYDROMET) 5-1.5 MG/5ML syrup Take 5 mLs by mouth every 6 (six) hours as needed. 240 mL 0  . L-Lysine 500 MG TABS Take by mouth as needed.      . mometasone-formoterol (DULERA) 200-5 MCG/ACT AERO 2 puffs then rinse mouth, twice daily- maintenance 13 g 5  . montelukast (SINGULAIR) 10 MG tablet Take 1 tablet (10 mg total) by mouth at bedtime. 30 tablet 5  . triamcinolone (NASACORT) 55 MCG/ACT AERO nasal inhaler Place 2 sprays into the nose daily.     No current facility-administered medications on file prior to visit.

## 2015-03-26 ENCOUNTER — Encounter: Payer: Self-pay | Admitting: Internal Medicine

## 2015-03-26 ENCOUNTER — Ambulatory Visit (INDEPENDENT_AMBULATORY_CARE_PROVIDER_SITE_OTHER): Payer: Medicare Other | Admitting: Internal Medicine

## 2015-03-26 VITALS — BP 134/86 | HR 81 | Temp 98.8°F | Resp 18 | Wt 130.0 lb

## 2015-03-26 DIAGNOSIS — J0100 Acute maxillary sinusitis, unspecified: Secondary | ICD-10-CM

## 2015-03-26 MED ORDER — AMOXICILLIN-POT CLAVULANATE 875-125 MG PO TABS: 1.0000 | ORAL_TABLET | Freq: Two times a day (BID) | ORAL | Status: DC

## 2015-03-26 NOTE — Progress Notes (Signed)
   Subjective:    Patient ID: Susan Chavez, female    DOB: 1940/09/19, 74 y.o.   MRN: [id number]  HPI   She has had symptoms for 3 weeks. Initially she had facial pressure associated with postnasal drainage and increased cough. Additionally she noted sore throat. She took Z-Pak but symptoms persisted. Subsequently she took amoxicillin for 5 days. That had been prescribed for SBE prophylaxis.  She's had associated watery eyes with itching. She describes left facial pressure and left dental pain. The sore throat is mainly at night. Cough is productive of clear white and thick secretions. She's had associated wheezing. She is on Dulera, Singulair, Nasacort.   Review of Systems Frontal headache , nasal purulence,  or otic discharge denied. No fever , chills or sweats. Extrinsic symptoms of  sneezing or angioedema are denied. There is no paroxysmal nocturnal dyspnea.     Objective:   Physical Exam Pertinent or positive findings include: Arcus senilis is present. She has mild erythema of the nasal mucosa. A click with slurring is suggested at the apex without mitral regurgitation.  General appearance:Adequately nourished; no acute distress or increased work of breathing is present.    Lymphatic: No  lymphadenopathy about the head, neck, or axilla .  Eyes: No conjunctival inflammation or lid edema is present. There is no scleral icterus.  Ears:  External ear exam shows no significant lesions or deformities.  Otoscopic examination reveals clear canals, tympanic membranes are intact bilaterally without bulging, retraction, inflammation or discharge.  Nose:  External nasal examination shows no deformity or inflammation.No septal dislocation or deviation.No obstruction to airflow.   Oral exam: Dental hygiene is good; lips and gums are healthy appearing.There is no oropharyngeal erythema or exudate .  Neck:  No deformities, thyromegaly, masses, or tenderness noted.   Supple with full range of  motion without pain.   Heart:  Normal rate and regular rhythm. S1 and S2 normal without gallop, murmur, or rub .   Lungs:Chest clear to auscultation; no wheezes, rhonchi,rales ,or rubs present.  Extremities:  No cyanosis, edema, or clubbing  noted    Skin: Warm & dry w/o tenting or jaundice. No significant lesions or rash.       Assessment & Plan:  #1 acute left maxillary sinusitis  See orders and recommendations

## 2015-03-26 NOTE — Patient Instructions (Signed)

## 2015-03-26 NOTE — Progress Notes (Signed)
Pre visit review using our clinic review tool, if applicable. No additional management support is needed unless otherwise documented below in the visit note. 

## 2015-04-04 ENCOUNTER — Ambulatory Visit: Payer: Medicare Other | Admitting: Internal Medicine

## 2015-04-15 ENCOUNTER — Telehealth: Payer: Self-pay | Admitting: Internal Medicine

## 2015-04-15 NOTE — Telephone Encounter (Signed)
Patient says that she does not want to be treated via telephone.   She wants to be seen in person.  Offered her an appointment with another provider, she says that she has already scheduled an appointment to see Dr. Linna Darner tomorrow. Patient does not want medication called into pharmacy, she will see what Dr. Linna Darner recommends.  Nothing further needed.

## 2015-04-15 NOTE — Telephone Encounter (Signed)
Spoke with pt, c/o sinus infection, pnd, producing gold/tan mucus X1 month off and on. Denies fever.  Pt also notes chest congestion. Pt is flying tonight and is requesting an appt with CY today.  I advised that he has no openings today.   Pt uses [facility].    Last ov: 3/4/126 with TP Next ov: 07/24/15  CY please advise on recs.  Thanks!  Allergies  Allergen Reactions  . Tetanus Toxoid     Eyelid edema FROM HORSES; therefore , she was told she could not take Tetanus shot.She has had tetanus shot w/o adverse effect since 2002   Current Outpatient Prescriptions on File Prior to Visit  Medication Sig Dispense Refill  . amoxicillin-clavulanate (AUGMENTIN) 875-125 MG per tablet Take 1 tablet by mouth 2 (two) times daily. 20 tablet 0  . dextromethorphan-guaiFENesin (MUCINEX DM) 30-600 MG per 12 hr tablet Take 1 tablet by mouth 2 (two) times daily as needed for cough.    . escitalopram (LEXAPRO) 10 MG tablet 1/2-1 qd 90 tablet 1  . hydrochlorothiazide (MICROZIDE) 12.5 MG capsule Take 1 capsule (12.5 mg total) by mouth daily. 30 capsule 5  . HYDROcodone-homatropine (HYDROMET) 5-1.5 MG/5ML syrup Take 5 mLs by mouth every 6 (six) hours as needed. 240 mL 0  . L-Lysine 500 MG TABS Take by mouth as needed.      . mometasone-formoterol (DULERA) 200-5 MCG/ACT AERO 2 puffs then rinse mouth, twice daily- maintenance 13 g 5  . montelukast (SINGULAIR) 10 MG tablet Take 1 tablet (10 mg total) by mouth at bedtime. 30 tablet 5  . triamcinolone (NASACORT) 55 MCG/ACT AERO nasal inhaler Place 2 sprays into the nose daily.     No current facility-administered medications on file prior to visit.

## 2015-04-15 NOTE — Telephone Encounter (Signed)
Offer cefdinir 300 mg, # 14, 1 twice daily Consider sudafed if needed to help with hear pressure during airflight

## 2015-04-16 ENCOUNTER — Encounter: Payer: Self-pay | Admitting: Internal Medicine

## 2015-04-16 ENCOUNTER — Other Ambulatory Visit (INDEPENDENT_AMBULATORY_CARE_PROVIDER_SITE_OTHER): Payer: Medicare Other

## 2015-04-16 ENCOUNTER — Ambulatory Visit (INDEPENDENT_AMBULATORY_CARE_PROVIDER_SITE_OTHER): Payer: Medicare Other | Admitting: Internal Medicine

## 2015-04-16 ENCOUNTER — Ambulatory Visit (INDEPENDENT_AMBULATORY_CARE_PROVIDER_SITE_OTHER)
Admission: RE | Admit: 2015-04-16 | Discharge: 2015-04-16 | Disposition: A | Discharge status: Home / Self Care | Payer: Medicare Other | Source: Ambulatory Visit | Attending: Internal Medicine | Admitting: Internal Medicine

## 2015-04-16 VITALS — BP 140/78 | HR 67 | Temp 98.0°F | Resp 16 | Wt 134.0 lb

## 2015-04-16 DIAGNOSIS — J329 Chronic sinusitis, unspecified: Secondary | ICD-10-CM

## 2015-04-16 DIAGNOSIS — Z23 Encounter for immunization: Secondary | ICD-10-CM | POA: Diagnosis not present

## 2015-04-16 DIAGNOSIS — J42 Unspecified chronic bronchitis: Secondary | ICD-10-CM

## 2015-04-16 LAB — CBC WITH DIFFERENTIAL/PLATELET
BASOS PCT: 0.6 % (ref 0.0–3.0)
Basophils Absolute: 0 10*3/uL (ref 0.0–0.1)
EOS ABS: 0.2 10*3/uL (ref 0.0–0.7)
Eosinophils Relative: 3.1 % (ref 0.0–5.0)
HCT: 39 % (ref 36.0–46.0)
Hemoglobin: 13.2 g/dL (ref 12.0–15.0)
LYMPHS ABS: 1.9 10*3/uL (ref 0.7–4.0)
Lymphocytes Relative: 25.5 % (ref 12.0–46.0)
MCHC: 33.7 g/dL (ref 30.0–36.0)
MCV: 92.8 fl (ref 78.0–100.0)
MONO ABS: 0.5 10*3/uL (ref 0.1–1.0)
Monocytes Relative: 6.6 % (ref 3.0–12.0)
NEUTROS ABS: 4.8 10*3/uL (ref 1.4–7.7)
Neutrophils Relative %: 64.2 % (ref 43.0–77.0)
PLATELETS: 311 10*3/uL (ref 150.0–400.0)
RBC: 4.21 Mil/uL (ref 3.87–5.11)
RDW: 14 % (ref 11.5–15.5)
WBC: 7.4 10*3/uL (ref 4.0–10.5)

## 2015-04-16 MED ORDER — HYDROCODONE-HOMATROPINE 5-1.5 MG/5ML PO SYRP: 5.0000 mL | ORAL_SOLUTION | Freq: Four times a day (QID) | ORAL | Status: DC | PRN

## 2015-04-16 NOTE — Progress Notes (Signed)
Pre visit review using our clinic review tool, if applicable. No additional management support is needed unless otherwise documented below in the visit note. 

## 2015-04-16 NOTE — Progress Notes (Signed)
   Subjective:    Patient ID: Susan Chavez, female    DOB: 07-19-1941, 74 y.o.   MRN: [id number]  HPI She developed malaise and nasal congestion 4 days ago, 9/23 while in [place] for vacation. She started having expiratory wheezing in the context of postnasal drainage. She began to cough up tan sputum. She also had a lesser volume of tan nasal secretions.  She had completed a course of Augmentin of 10 days. This was subsequent to taking Z-Pak. She has continued her Symbicort or Dulera.  Today she has maxillary sinus pressure as well as some pink sputum. She was exposed to sick individuals while on the flight to and from [place]. She did wear a mask in flight.  She had some nosebleed while on the plane on the flight out and back. She also noted some "soreness" and "floppiness" of the roof of her mouth. This has resolved.  The cough has been associated with wheezing but not shortness of breath. There has been no frank hemoptysis.  She has no reflux symptoms. There are no other upper respiratory tract infection symptoms.  Review of Systems Frontal headache, frank facial pain ,  dental pain, sore throat , otic pain or otic discharge denied. No fever , chills or sweats.  Unexplained weight loss, abdominal pain, significant dyspepsia, dysphagia, melena, rectal bleeding, or persistently small caliber stools are denied.    Objective:   Physical Exam  General appearance:Thin but adequately nourished; no acute distress or increased work of breathing is present.    Lymphatic: No  lymphadenopathy about the head, neck, or axilla .  Eyes: No conjunctival inflammation or lid edema is present. There is no scleral icterus.She has arcus senilis.   Ears:  External ear exam shows no significant lesions or deformities.  Otoscopic examination reveals clear canals, tympanic membranes are intact bilaterally without bulging, retraction, inflammation or discharge.  Nose:  External nasal examination shows no  deformity or inflammation. Nasal mucosa are pink and moist without lesions or exudates No septal dislocation or deviation.No obstruction to airflow.   Oral exam: Dental hygiene is good; lips and gums are healthy appearing.There is no oropharyngeal erythema or exudate.Candidal lesions not seen.  Neck:  No deformities, thyromegaly, masses, or tenderness noted.   Supple with full range of motion without pain.   Heart:  Normal rate and regular rhythm. S1 and S2 normal without gallop, murmur, click, rub or other extra sounds.   Lungs:She has low-grade extra rhonchi.No increased WOB.  Extremities:  No cyanosis, edema, or clubbing  noted    Skin: Warm & dry w/o tenting or jaundice. No significant lesions or rash.     Assessment & Plan:  #1 recurrent upper lower restrict tract infections despite broad-spectrum antibiotics. Rule out atypical infection or fungal infection.  #2 malaise.  See orders and recommendations

## 2015-04-16 NOTE — Patient Instructions (Signed)
Zicam Melts or Zinc lozenges as per package label for sore throat .  Complementary options to boost immunity include  vitamin C 2000 mg daily; & Echinacea for 4-7 days.  

## 2015-04-18 LAB — PROTEIN ELECTROPHORESIS, SERUM
ALPHA-1-GLOBULIN: 0.3 g/dL (ref 0.2–0.3)
ALPHA-2-GLOBULIN: 0.7 g/dL (ref 0.5–0.9)
Albumin ELP: 4.3 g/dL (ref 3.8–4.8)
Beta 2: 0.3 g/dL (ref 0.2–0.5)
Beta Globulin: 0.4 g/dL (ref 0.4–0.6)
GAMMA GLOBULIN: 0.9 g/dL (ref 0.8–1.7)
Total Protein, Serum Electrophoresis: 6.9 g/dL (ref 6.1–8.1)

## 2015-04-19 ENCOUNTER — Telehealth: Payer: Self-pay | Admitting: *Deleted

## 2015-04-19 ENCOUNTER — Other Ambulatory Visit: Payer: Self-pay | Admitting: Internal Medicine

## 2015-04-19 MED ORDER — CLOTRIMAZOLE 10 MG MT TROC: OROMUCOSAL | Status: DC

## 2015-04-19 NOTE — Telephone Encounter (Signed)
Receive call pt states md was suppose to send a prescription for [place] states they haven't received. Pls advise...Susan Chavez

## 2015-04-19 NOTE — Telephone Encounter (Signed)
Notified pt md sent medication to [facility]...Johny Chess

## 2015-04-19 NOTE — Telephone Encounter (Signed)
done

## 2015-05-14 ENCOUNTER — Other Ambulatory Visit (INDEPENDENT_AMBULATORY_CARE_PROVIDER_SITE_OTHER): Payer: Medicare Other

## 2015-05-14 ENCOUNTER — Other Ambulatory Visit: Payer: Self-pay | Admitting: Internal Medicine

## 2015-05-14 ENCOUNTER — Ambulatory Visit (INDEPENDENT_AMBULATORY_CARE_PROVIDER_SITE_OTHER): Payer: Medicare Other | Admitting: Internal Medicine

## 2015-05-14 ENCOUNTER — Encounter: Payer: Self-pay | Admitting: Internal Medicine

## 2015-05-14 VITALS — BP 120/70 | HR 75 | Temp 98.2°F | Resp 16 | Wt 131.0 lb

## 2015-05-14 DIAGNOSIS — J029 Acute pharyngitis, unspecified: Secondary | ICD-10-CM

## 2015-05-14 DIAGNOSIS — J312 Chronic pharyngitis: Secondary | ICD-10-CM

## 2015-05-14 DIAGNOSIS — R5383 Other fatigue: Secondary | ICD-10-CM | POA: Diagnosis not present

## 2015-05-14 LAB — CBC WITH DIFFERENTIAL/PLATELET
BASOS PCT: 0.9 % (ref 0.0–3.0)
Basophils Absolute: 0.1 10*3/uL (ref 0.0–0.1)
EOS ABS: 0.3 10*3/uL (ref 0.0–0.7)
EOS PCT: 4.1 % (ref 0.0–5.0)
HEMATOCRIT: 45.8 % (ref 36.0–46.0)
HEMOGLOBIN: 15.3 g/dL — AB (ref 12.0–15.0)
LYMPHS PCT: 23.5 % (ref 12.0–46.0)
Lymphs Abs: 1.8 10*3/uL (ref 0.7–4.0)
MCHC: 33.3 g/dL (ref 30.0–36.0)
MCV: 93.3 fl (ref 78.0–100.0)
MONO ABS: 0.5 10*3/uL (ref 0.1–1.0)
Monocytes Relative: 6 % (ref 3.0–12.0)
Neutro Abs: 5 10*3/uL (ref 1.4–7.7)
Neutrophils Relative %: 65.5 % (ref 43.0–77.0)
Platelets: 324 10*3/uL (ref 150.0–400.0)
RBC: 4.91 Mil/uL (ref 3.87–5.11)
RDW: 13.3 % (ref 11.5–15.5)
WBC: 7.6 10*3/uL (ref 4.0–10.5)

## 2015-05-14 LAB — HEPATIC FUNCTION PANEL
ALT: 14 U/L (ref 0–35)
AST: 23 U/L (ref 0–37)
Albumin: 4.5 g/dL (ref 3.5–5.2)
Alkaline Phosphatase: 62 U/L (ref 39–117)
BILIRUBIN DIRECT: 0.1 mg/dL (ref 0.0–0.3)
BILIRUBIN TOTAL: 0.5 mg/dL (ref 0.2–1.2)
TOTAL PROTEIN: 7.6 g/dL (ref 6.0–8.3)

## 2015-05-14 LAB — BASIC METABOLIC PANEL
BUN: 12 mg/dL (ref 6–23)
CHLORIDE: 99 meq/L (ref 96–112)
CO2: 29 mEq/L (ref 19–32)
CREATININE: 0.91 mg/dL (ref 0.40–1.20)
Calcium: 10.2 mg/dL (ref 8.4–10.5)
GFR: 63.99 mL/min (ref >60.00)
Glucose, Bld: 107 mg/dL — ABNORMAL HIGH (ref 70–99)
POTASSIUM: 3.8 meq/L (ref 3.5–5.1)
Sodium: 139 mEq/L (ref 135–145)

## 2015-05-14 LAB — TSH: TSH: 2.16 u[IU]/mL (ref 0.35–4.50)

## 2015-05-14 LAB — BETA STREP SCREEN: STREPTOCOCCUS, GROUP A SCREEN (DIRECT): NEGATIVE

## 2015-05-14 NOTE — Patient Instructions (Signed)
Zicam Melts or Zinc lozenges as per package label for sore throat .  Complementary options to boost immunity include  vitamin C 2000 mg daily; & Echinacea for 4-7 days.  

## 2015-05-14 NOTE — Progress Notes (Signed)
Pre visit review using our clinic review tool, if applicable. No additional management support is needed unless otherwise documented below in the visit note. 

## 2015-05-14 NOTE — Progress Notes (Signed)
   Subjective:    Patient ID: Susan Chavez, female    DOB: 09-18-1940, 74 y.o.   MRN: [id number]  HPI She's had intermittent sore throat since July of this year. She's only been temporarily better with antibiotics.  She has a productive cough with tan sputum. She describes fullness in the maxillary sinus areas. She states that there is no otic pain but she is "aware" of her ears. The left ear pops.  The major issue is sore throat the last 2 days on the right. She had been treated for possible thrush with Mycelex troches.  She describes nasal discharge as well as itchy, watery eyes.  Her asthma is stable by her description.  She did have sinus films and chest x-ray 9/27 which were normal or negative.  Review of Systems Frontal headache, facial pain , nasal purulence, dental pain, otic pain or otic discharge denied. No fever , chills or sweats.  Chest pain, palpitations, tachycardia,  paroxysmal nocturnal dyspnea, claudication or edema are absent.  Unexplained weight loss, abdominal pain, significant dyspepsia, dysphagia, melena, rectal bleeding, or persistently small caliber stools are denied.     Objective:   Physical Exam Pertinent positive findings include: Arcus senilis present. The second heart sound is split. She has intermittent nonproductive cough.  General appearance:Adequately nourished; no acute distress or increased work of breathing is present.    Lymphatic: No  lymphadenopathy about the head, neck, or axilla .  Eyes: No conjunctival inflammation or lid edema is present. There is no scleral icterus.  Ears:  External ear exam shows no significant lesions or deformities.  Otoscopic examination reveals clear canals, tympanic membranes are intact bilaterally without bulging, retraction, inflammation or discharge.  Nose:  External nasal examination shows no deformity or inflammation. Nasal mucosa are pink and moist without lesions or exudates No septal dislocation or  deviation.No obstruction to airflow.   Oral exam: Dental hygiene is good; lips and gums are healthy appearing.There is no oropharyngeal erythema or exudate .  Neck:  No deformities, thyromegaly, masses, or tenderness noted.   Supple with full range of motion without pain.   Heart:  Normal rate and regular rhythm. S1 normal without gallop, murmur, click, rub or other extra sounds.   Lungs:Chest clear to auscultation; no wheezes, rhonchi,rales ,or rubs present.  Extremities:  No cyanosis, edema, or clubbing  noted    Skin: Warm & dry w/o tenting or jaundice. No significant lesions or rash.     Assessment & Plan:  #1 pharyngitis; no evidence of thrush clinically  #2 fatigue  #3 chronic intermittent asthma  See orders & AVS. If culture and labs are nondiagnostic; she'll be referred to ENT for possible direct laryngoscopy.

## 2015-05-15 ENCOUNTER — Other Ambulatory Visit (INDEPENDENT_AMBULATORY_CARE_PROVIDER_SITE_OTHER): Payer: Medicare Other

## 2015-05-15 DIAGNOSIS — R739 Hyperglycemia, unspecified: Secondary | ICD-10-CM | POA: Diagnosis not present

## 2015-05-15 LAB — HEMOGLOBIN A1C: Hgb A1c MFr Bld: 5.7 % (ref 4.6–6.5)

## 2015-05-16 ENCOUNTER — Other Ambulatory Visit: Payer: Self-pay | Admitting: Adult Health

## 2015-05-29 ENCOUNTER — Ambulatory Visit (INDEPENDENT_AMBULATORY_CARE_PROVIDER_SITE_OTHER): Payer: Medicare Other | Admitting: Internal Medicine

## 2015-05-29 ENCOUNTER — Encounter: Payer: Self-pay | Admitting: Internal Medicine

## 2015-05-29 VITALS — BP 138/80 | HR 63 | Temp 98.4°F | Resp 16 | Wt 132.0 lb

## 2015-05-29 DIAGNOSIS — J45909 Unspecified asthma, uncomplicated: Secondary | ICD-10-CM | POA: Diagnosis not present

## 2015-05-29 DIAGNOSIS — I10 Essential (primary) hypertension: Secondary | ICD-10-CM

## 2015-05-29 DIAGNOSIS — J45901 Unspecified asthma with (acute) exacerbation: Secondary | ICD-10-CM | POA: Insufficient documentation

## 2015-05-29 DIAGNOSIS — E785 Hyperlipidemia, unspecified: Secondary | ICD-10-CM | POA: Diagnosis not present

## 2015-05-29 DIAGNOSIS — J309 Allergic rhinitis, unspecified: Secondary | ICD-10-CM | POA: Diagnosis not present

## 2015-05-29 DIAGNOSIS — J302 Other seasonal allergic rhinitis: Secondary | ICD-10-CM

## 2015-05-29 DIAGNOSIS — J3089 Other allergic rhinitis: Secondary | ICD-10-CM

## 2015-05-29 NOTE — Assessment & Plan Note (Signed)
BP ok today here, but variable at home Continue to monitor at home - return if average BP is elevated Continue current medication Continue low sodium diet and regular exercise

## 2015-05-29 NOTE — Assessment & Plan Note (Signed)
Not on medication Reviewed lipid panel Continue regular exercise  Will monitor for now

## 2015-05-29 NOTE — Progress Notes (Signed)
Pre visit review using our clinic review tool, if applicable. No additional management support is needed unless otherwise documented below in the visit note. 

## 2015-05-29 NOTE — Assessment & Plan Note (Signed)
Flares with allergies, cold symptoms Some wheezing - lungs sound normal Continue dulera twice daily No steroids needed Follow up if no improvement

## 2015-05-29 NOTE — Assessment & Plan Note (Signed)
Not completely controlled Continue singulair Start nasocort daily -- most of her symptoms now seem to be related to allergies - no evidence of a bacterial infection

## 2015-05-29 NOTE — Patient Instructions (Signed)
   All other Health Maintenance issues reviewed.   All recommended immunizations and age-appropriate screenings are up-to-date.  No immunizations administered today.   Medications reviewed and updated.  Restart nasocort daily.  You can try zyrtec.   Your prescription(s) have been submitted to your pharmacy. Please take as directed and contact our office if you believe you are having problem(s) with the medication(s).  Please followup annually

## 2015-05-29 NOTE — Progress Notes (Signed)
Subjective:    Patient ID: Susan Chavez, female    DOB: 1941/01/14, 74 y.o.   MRN: [id number]  HPI She is here to establish with a new pcp.  She has had a lot of allergy symptoms and cold symptoms this fall.  She still as residual nasal congestion, her ears feel clogged at time, decreased hearing at times, hoarseness, postnasal drip and coughing productive in the morning. She is taking currently inhaler daily, her Singulair daily and on occasion she will use her Nasacort nasal spray.She does not take oral antihistamine.  Hypertension: She is taking her medication daily. She is compliant with a low sodium diet.  She denies chest pain, palpitations, edema, shortness of breath and regular headaches. She is exercising regularly.  She does monitor her blood pressure at home and recently has been slightly elevated. Her blood pressure has been variable - she did see a systolic BP of 542 at one point.   Her bp was always well controlled. She denies any over-the-counter decongestants.    Allergies, asthma:  She uses the dulera and singulair daily.  She uses the nasocort most days.  When she first gets up in the morning she coughs up phegm  - no discooloration.  She coughs intermittently through the day, but denies sob. Her chest feels tight at times and she hears wheezing.  Works out twice a week with a Clinical research associate.    Medications and allergies reviewed with patient and updated if appropriate.  Patient Active Problem List   Diagnosis Date Noted  . Osteopenia 01/18/2015  . Essential hypertension 10/24/2014  . S/P repair of patent ductus arteriosus 12/11/2010  . BENIGN POSITIONAL VERTIGO 06/09/2010  . OVARIAN CYST 06/09/2010  . History of colonic polyps 09/14/2008  . Seasonal and perennial allergic rhinitis 07/25/2007  . Hyperlipidemia 10/14/2006  . Asthma exacerbation 10/14/2006    Current Outpatient Prescriptions on File Prior to Visit  Medication Sig Dispense Refill  .  dextromethorphan-guaiFENesin (MUCINEX DM) 30-600 MG per 12 hr tablet Take 1 tablet by mouth 2 (two) times daily as needed for cough.    . DULERA 200-5 MCG/ACT AERO 2 PUFFS TWICE DAILY THEN RINSE MOUTH- MAINTENANCE 13 g 5  . escitalopram (LEXAPRO) 10 MG tablet 1/2-1 qd 90 tablet 1  . hydrochlorothiazide (MICROZIDE) 12.5 MG capsule Take 1 capsule (12.5 mg total) by mouth daily. 30 capsule 5  . HYDROcodone-homatropine (HYDROMET) 5-1.5 MG/5ML syrup Take 5 mLs by mouth every 6 (six) hours as needed. 240 mL 0  . L-Lysine 500 MG TABS Take by mouth as needed.      . montelukast (SINGULAIR) 10 MG tablet Take 1 tablet (10 mg total) by mouth at bedtime. 30 tablet 5  . triamcinolone (NASACORT) 55 MCG/ACT AERO nasal inhaler Place 2 sprays into the nose daily.     No current facility-administered medications on file prior to visit.    Past Medical History  Diagnosis Date  . Colon polyp 2003  . Hyperlipidemia   . Asthma   . Allergic rhinitis     former patient at [facility] with allergy injections  . seizure 1982    had a seizure x 1 74 yo    Past Surgical History  Procedure Laterality Date  . Tonsillectomy    . Colonoscopy    . Patent ductus arterious repair      AGE 58  . Breast surgery      possible tumor-benign    Social History   Social History  .  Marital Status: Married    Spouse Name: N/A  . Number of Children: 2  . Years of Education: N/A   Occupational History  . teacher-small business owner-retired    Social History Main Topics  . Smoking status: Never Smoker   . Smokeless tobacco: None  . Alcohol Use: Yes     Comment: 7 drinks per week  . Drug Use: No  . Sexual Activity: Not Asked   Other Topics Concern  . None   Social History Narrative   Lives with spouse; has grand children    Review of Systems  Constitutional: Negative for fever and chills.  HENT: Positive for congestion and postnasal drip. Negative for ear pain, sinus pressure and sore throat.        Ears  feel plugged intermittently, ear popping intermittently  Respiratory: Positive for cough (productive in morning only), chest tightness and wheezing. Negative for shortness of breath.   Cardiovascular: Negative for chest pain, palpitations and leg swelling.  Gastrointestinal:       No GERD  Neurological: Negative for dizziness, light-headedness and headaches.       Objective:   Filed Vitals:   05/29/15 0857  BP: 138/80  Pulse: 63  Temp: 98.4 F (36.9 C)  Resp: 16   Filed Weights   05/29/15 0857  Weight: 132 lb (59.875 kg)   Body mass index is 22.65 kg/(m^2).   Physical Exam  Constitutional: She appears well-developed and well-nourished. No distress.  HENT:  Head: Normocephalic and atraumatic.  Right Ear: External ear normal.  Left Ear: External ear normal.  Nose: Nose normal.  Mouth/Throat: Oropharynx is clear and moist. No oropharyngeal exudate.  Normal ear canals and TM b/l  Eyes: Conjunctivae and EOM are normal.  Neck: Neck supple. No tracheal deviation present. No thyromegaly present.  No carotid bruit  Cardiovascular: Normal rate, regular rhythm and intact distal pulses.   Murmur (1/6 systolic murmur) heard. Pulmonary/Chest: Effort normal and breath sounds normal. No respiratory distress. She has no wheezes. She has no rales.  Musculoskeletal: She exhibits no edema.  Lymphadenopathy:    She has no cervical adenopathy.  Skin: Skin is warm and dry.  Psychiatric: She has a normal mood and affect. Her behavior is normal.          Assessment & Plan:   See Problem List.

## 2015-07-24 ENCOUNTER — Ambulatory Visit (INDEPENDENT_AMBULATORY_CARE_PROVIDER_SITE_OTHER): Payer: Medicare Other | Admitting: Internal Medicine

## 2015-07-24 ENCOUNTER — Encounter: Payer: Self-pay | Admitting: Internal Medicine

## 2015-07-24 VITALS — BP 116/72 | HR 55 | Ht 64.0 in | Wt 128.4 lb

## 2015-07-24 DIAGNOSIS — J3089 Other allergic rhinitis: Secondary | ICD-10-CM

## 2015-07-24 DIAGNOSIS — J302 Other seasonal allergic rhinitis: Secondary | ICD-10-CM

## 2015-07-24 NOTE — Progress Notes (Signed)
08/01/13- 3 yoF never smoker  Self referral-asthma; constant cough per patient. Long history of asthma. Was an allergy vaccine patient of Dr. Gerilyn Nestle for several years and says it helped. Cough has been worse for last 5 months, worse in cold weather and better in steam. No other triggers recognized. Cough productive clear phlegm. She denies nasal congestion or drainage, using Nasacort. Chest feels tight. Recognizes GERD especially at night. She describes raspy feeling in her upper airway. Now on a prednisone taper. Has lived in her current house for 3 years with no smokers, gas heat, some carpet, no pets, no mold. Son has asthma. CXR 07/28/13 IMPRESSION:  No active cardiopulmonary disease.  Electronically Signed  By: [name]  On: 07/28/2013 13:57 PFT 10/06/2007- WNL. FVC 2.66/ 88%, FEV1 2.14/ 98%, FEV1/FVC 0.8, TLC 105%, DLCO 98%  10/10/13- 72 yoF never smoker  Self referral-asthma; constant cough per patient. FOLLOWS [id number] is working well for the patient; review labs in detail with patient. Still having a deeper voice than usual. Allergy wise she is doing well-1 sinus episode and 1 time had clogged up sinus area with windy days Feels happy with status, well now. Minimal cough. Allergy Profile 08/01/13- Total IgE 151.4, Broad elevations for many common environmental allergens.  03/29/14- 73 yoF never smoker  Self referral-asthma; constant cough per patient. FOLLOWS FOR:  acute visit today.  congestion and wheezing. much better today.   1 week of acute onset wheeze, sore throat, no fever, clear mucus/ productive cough. She resumed Singulair. Better today. Going to [place] with husb who was on chemoRx for lymphoma.  04/20/2014 Acute OV  Complains of increased SOB, wheezing, prod cough with white/clear mucus, head congestion, bilateral ear congestion, HA x5 days.  Finished zpak yesterday.   Denies any f/c/s, n/v/d, hemoptysis, chest pain, orthopnea, calf pain .  Had mild URI 1 month  ago, resolved shortly and felt much better until 5 days ago.  Says several people on the trip got cold with cough and congestion . Husband has similar symptoms.  On Dulera but out for 2 weeks .  Coughing up thick white mucus . Cough is keeping her up at night and has daytime coughing fit.   04/27/14-73 yoF never smoker followed for asthma, chronic cough, allergic rhinitis ACUTE VISIT: Seen 04-20-14 by TP; fatigued, Chest tightness with wheezing, cough-productive, nasal drainage. Denies any fevers.  Current illness began as a definite bad cold while on bus trip with husband. Seen on 10/2 by TP. Improved some after taking pred taper, augmentin, restarting Dulera, occasional use of cough syrup. Still active cough productive white, nasal congestion with white, persistent soreness L upper anterior chest wall. Denies fever, blood, nodes, palpitation, leg pain or swelling. >zpack   09/21/2014 Acute OV : never smoker , asthma , AR and chronic cough  Patient presents for an acute office visit. Complains of cough, sore throat, nasal congestion, intermittent wheezing, nasal drainage and sinus congestion. She denies any hemoptysis, chest pain, orthopnea, PND or leg swelling Was called in a prednisone pack 2 weeks ago. Feels that it helps some, but she continues to have cough and congestion.  07/24/2015- 30 yoyoF never smoker followed for asthma, chronic cough, allergic rhinitisF FOLLOWS FOR:  No new complaints. Saw ENT this past year and was diagnosed with reflux, treatment given and cough has improved. Some clear foamy mucus production with asthma flares.   CXR 04/16/2015 IMPRESSION: Hyperinflation consistent with known reactive airway disease. There is no evidence of pneumonia nor  other acute cardiopulmonary abnormality. Electronically Signed  By: David Martinique M.D.  On: 04/16/2015 16:46   ROS-see HPI Constitutional:   No-   weight loss, night sweats, fevers, chills, fatigue, lassitude. HEENT:    No-  headaches, difficulty swallowing, tooth/dental problems, +sore throat,       No-  sneezing, itching, ear ache, +nasal congestion, +post nasal drip,  CV:  No-   chest pain, orthopnea, PND, swelling in lower extremities, anasarca,                                                         dizziness, palpitations Resp: +  shortness of breath with exertion or at rest.             + productive cough,  + non-productive cough,  No- coughing up of blood.              No-   change in color of mucus.  No- wheezing.   Skin: No-   rash or lesions. GI:  No- heartburn, indigestion, abdominal pain, nausea, vomiting,  GU:  MS:  No-   joint pain or swelling.   Neuro-     nothing unusual Psych:  No- change in mood or affect. No depression +anxiety.  No memory loss.  OBJ- Physical Exam General- Alert, Oriented, Affect-appropriate, Distress- none acute, trim, alert Skin- rash-none, lesions- none, excoriation- none Lymphadenopathy- none Head- atraumatic            Eyes- Gross vision intact, PERRLA, conjunctivae and secretions clear            Ears- Hearing, canals-normal            Nose- Clear, no-Septal dev, mucus, polyps, erosion, perforation             Throat- Mallampati II , mucosa clear , drainage- none, tonsils- atrophic Neck- flexible , trachea midline, no stridor , thyroid nl, carotid no bruit Chest - symmetrical excursion , unlabored           Heart/CV- RRR , no murmur , no gallop  , no rub, nl s1 s2                           - JVD- none , edema- none, stasis changes- none, varices- none           Lung- CTA w/ no wheezing                                                   dullness-none, rub- none           Chest wall-  Abd-  Br/ Gen/ Rectal- Not done, not indicated Extrem- cyanosis- none, clubbing, none, atrophy- none, strength- nl Neuro- grossly intact to observation   cXR 04/20/2014 >clear

## 2015-07-24 NOTE — Patient Instructions (Signed)
Ok to try off montelukast, restarting as needed  We will watch as the spring pollens come in late February

## 2015-09-04 ENCOUNTER — Telehealth: Payer: Self-pay | Admitting: Internal Medicine

## 2015-09-04 MED ORDER — MONTELUKAST SODIUM 10 MG PO TABS: 10.0000 mg | ORAL_TABLET | Freq: Every day | ORAL | Status: DC

## 2015-09-04 NOTE — Telephone Encounter (Signed)
Spoke with pt. She is needing a refill on Singulair. Rx has been sent in. Nothing further was needed. 

## 2015-10-02 DIAGNOSIS — L918 Other hypertrophic disorders of the skin: Secondary | ICD-10-CM | POA: Diagnosis not present

## 2015-10-02 DIAGNOSIS — L720 Epidermal cyst: Secondary | ICD-10-CM | POA: Diagnosis not present

## 2015-10-02 DIAGNOSIS — L814 Other melanin hyperpigmentation: Secondary | ICD-10-CM | POA: Diagnosis not present

## 2015-10-02 DIAGNOSIS — D2272 Melanocytic nevi of left lower limb, including hip: Secondary | ICD-10-CM | POA: Diagnosis not present

## 2015-10-02 DIAGNOSIS — D224 Melanocytic nevi of scalp and neck: Secondary | ICD-10-CM | POA: Diagnosis not present

## 2015-10-02 DIAGNOSIS — L821 Other seborrheic keratosis: Secondary | ICD-10-CM | POA: Diagnosis not present

## 2015-10-02 DIAGNOSIS — D225 Melanocytic nevi of trunk: Secondary | ICD-10-CM | POA: Diagnosis not present

## 2015-10-21 ENCOUNTER — Telehealth: Payer: Self-pay | Admitting: Internal Medicine

## 2015-10-21 MED ORDER — AMOXICILLIN-POT CLAVULANATE 875-125 MG PO TABS: 1.0000 | ORAL_TABLET | Freq: Two times a day (BID) | ORAL | Status: DC

## 2015-10-21 NOTE — Telephone Encounter (Signed)
Spoke with pt. States that she has a sinus infection. Reports sinus congestion/pressure, ear pain/congestion, coughing. Denies chest tightness, wheezing or SOB. Has been taking Mucinex and using a Netti Pot with minimal relief. Onset of symptoms was 4 days ago. Would like CY's recommendations.  Allergies  Allergen Reactions  . Tetanus Toxoid     Eyelid edema FROM HORSES; therefore , she was told she could not take Tetanus shot.She has had tetanus shot w/o adverse effect since 2002   Current Outpatient Prescriptions on File Prior to Visit  Medication Sig Dispense Refill  . Ascorbic Acid (VITAMIN C) 1000 MG tablet Take 1,000 mg by mouth daily.    Marland Kitchen dextromethorphan-guaiFENesin (MUCINEX DM) 30-600 MG per 12 hr tablet Take 1 tablet by mouth 2 (two) times daily as needed for cough.    . DULERA 200-5 MCG/ACT AERO 2 PUFFS TWICE DAILY THEN RINSE MOUTH- MAINTENANCE 13 g 5  . ECHINACEA PO Take by mouth.    . escitalopram (LEXAPRO) 10 MG tablet 1/2-1 qd 90 tablet 1  . hydrochlorothiazide (MICROZIDE) 12.5 MG capsule Take 1 capsule (12.5 mg total) by mouth daily. 30 capsule 5  . L-Lysine 500 MG TABS Take by mouth as needed.      . montelukast (SINGULAIR) 10 MG tablet Take 1 tablet (10 mg total) by mouth at bedtime. 30 tablet 5  . triamcinolone (NASACORT) 55 MCG/ACT AERO nasal inhaler Place 2 sprays into the nose daily.     No current facility-administered medications on file prior to visit.    CY - please advise. Thanks.

## 2015-10-21 NOTE — Telephone Encounter (Signed)
Spoke with pt, aware of recs.  rx sent to preferred pharmacy.  Nothing further needed.  

## 2015-10-21 NOTE — Telephone Encounter (Signed)
Offer augmentin 875 mg, # 14, 1 twice daily 

## 2015-10-31 ENCOUNTER — Telehealth: Payer: Self-pay | Admitting: Internal Medicine

## 2015-10-31 ENCOUNTER — Encounter: Payer: Self-pay | Admitting: Family

## 2015-10-31 ENCOUNTER — Ambulatory Visit (INDEPENDENT_AMBULATORY_CARE_PROVIDER_SITE_OTHER): Payer: Medicare Other | Admitting: Family

## 2015-10-31 ENCOUNTER — Other Ambulatory Visit: Payer: Self-pay

## 2015-10-31 VITALS — BP 128/70 | HR 81 | Temp 100.1°F | Ht 64.0 in | Wt 132.2 lb

## 2015-10-31 DIAGNOSIS — R05 Cough: Secondary | ICD-10-CM | POA: Diagnosis not present

## 2015-10-31 DIAGNOSIS — R059 Cough, unspecified: Secondary | ICD-10-CM

## 2015-10-31 MED ORDER — ALBUTEROL SULFATE (2.5 MG/3ML) 0.083% IN NEBU: 2.5000 mg | INHALATION_SOLUTION | Freq: Once | RESPIRATORY_TRACT | Status: DC

## 2015-10-31 MED ORDER — AZITHROMYCIN 250 MG PO TABS: ORAL_TABLET | ORAL | Status: DC

## 2015-10-31 MED ORDER — ESCITALOPRAM OXALATE 10 MG PO TABS: 5.0000 mg | ORAL_TABLET | Freq: Every day | ORAL | Status: DC

## 2015-10-31 NOTE — Progress Notes (Signed)
Pre visit review using our clinic review tool, if applicable. No additional management support is needed unless otherwise documented below in the visit note. 

## 2015-10-31 NOTE — Progress Notes (Signed)
Subjective:    Patient ID: Susan Chavez, female    DOB: [birth year], 75 y.o.   MRN: [id number]  Chief Complaint  Patient presents with  . Patient came in with LIST    febrile: 101 that started last night. Congestion in sinuses and painful to the touch. Ears ache and are stopped up. Tinnitus. Productive cough with discolored sputum. Sore Throat. Symptoms started in late March. Dr. Annamaria Boots rx'ed abx amoxicillin on 10/21/15. This did not help with symptoms. Patient has used a netti pot since symptoms first started.     HPI Comments: Of note, Pulmonologist called in prescription for Augmentin 4/4 however this has not helped relieve symptoms.  Sinus Problem This is a new problem. The current episode started 1 to 4 weeks ago. The problem has been gradually worsening since onset. The maximum temperature recorded prior to her arrival was 100.4 - 100.9 F. The fever has been present for less than 1 day. The pain is mild. Associated symptoms include congestion, coughing, ear pain and sinus pressure. Pertinent negatives include no chills, headaches, shortness of breath or sore throat. Treatments tried: Nettipot, Augmentin, inhalers, mucinex.     Genece Wahlert is a 75 y.o. female who  has a past medical history of Colon polyp (2003); Hyperlipidemia; Asthma; Allergic rhinitis; Polio; and seizure (1982). and presents today for an acute visit.   Review of Systems  Constitutional: Positive for fever. Negative for chills and unexpected weight change.  HENT: Positive for congestion, ear pain and sinus pressure. Negative for sore throat.   Eyes: Positive for discharge (watery discharge) and itching. Negative for visual disturbance.  Respiratory: Positive for cough and wheezing. Negative for shortness of breath.   Cardiovascular: Negative for chest pain, palpitations and leg swelling.  Gastrointestinal: Negative for nausea and vomiting.  Musculoskeletal: Negative for myalgias and arthralgias.  Skin: Negative  for rash.  Neurological: Negative for headaches.  Hematological: Negative for adenopathy.      Objective:    BP 128/70 mmHg  Pulse 81  Temp(Src) 100.1 F (37.8 C) (Oral)  Ht 5\' 4"  (1.626 m)  Wt 132 lb 4 oz (59.988 kg)  BMI 22.69 kg/m2  SpO2 94% Nursing note and vital signs reviewed.  Physical Exam  Constitutional: She appears well-developed and well-nourished.  HENT:  Head: Normocephalic and atraumatic.  Right Ear: Hearing, tympanic membrane, external ear and ear canal normal. No drainage, swelling or tenderness. No foreign bodies. Tympanic membrane is not erythematous and not bulging. No middle ear effusion. No decreased hearing is noted.  Left Ear: Hearing, tympanic membrane, external ear and ear canal normal. No drainage, swelling or tenderness. No foreign bodies. Tympanic membrane is not erythematous and not bulging.  No middle ear effusion. No decreased hearing is noted.  Nose: Rhinorrhea present. Right sinus exhibits no maxillary sinus tenderness and no frontal sinus tenderness. Left sinus exhibits no maxillary sinus tenderness and no frontal sinus tenderness.  Mouth/Throat: Uvula is midline, oropharynx is clear and moist and mucous membranes are normal. No oropharyngeal exudate, posterior oropharyngeal edema, posterior oropharyngeal erythema or tonsillar abscesses.  Eyes: Conjunctivae are normal.  Cardiovascular: Regular rhythm, normal heart sounds and normal pulses.   Pulmonary/Chest: Effort normal. She has wheezes in the right lower field and the left lower field. She has no rhonchi. She has no rales.  Few expiratory wheezes heard.  Lymphadenopathy:       Head (right side): No submental, no submandibular, no tonsillar, no preauricular, no posterior auricular and no occipital  adenopathy present.       Head (left side): No submental, no submandibular, no tonsillar, no preauricular, no posterior auricular and no occipital adenopathy present.    She has no cervical adenopathy.    Neurological: She is alert.  Skin: Skin is warm and dry.  Psychiatric: She has a normal mood and affect. Her speech is normal and behavior is normal. Thought content normal.  Vitals reviewed. Patient felt significantly better after albuterol treatment. Lung sounds clear and increased      Assessment & Plan:   1. Cough Working diagnosis of bronchitis. As patient failed treatment with Augmentin, and as I explained to patient and husband, this was likely because infection was viral. However at this point and based on duration of symptoms, it is reasonable to start antibiotic  - albuterol (PROVENTIL) (2.5 MG/3ML) 0.083% nebulizer solution 2.5 mg; Take 3 mLs (2.5 mg total) by nebulization once. - azithromycin (ZITHROMAX Z-PAK) 250 MG tablet; Take 2 tablets ( total of 500 mg) PO on day 1, then 1 tablet ( total of 250 mg) PO q24 x 4 days.  Dispense: 6 each; Refill: 0    I have discontinued Ms. Dillehay's amoxicillin-clavulanate. I am also having her maintain her L-Lysine, dextromethorphan-guaiFENesin, triamcinolone, hydrochlorothiazide, escitalopram, DULERA, vitamin C, ECHINACEA PO, and montelukast.   No orders of the defined types were placed in this encounter.     Start medications as prescribed and explained to patient on After Visit Summary ( AVS). Risks, benefits, and alternatives of the medications and treatment plan prescribed today were discussed, and patient expressed understanding.   Education regarding symptom management and diagnosis given to patient.   Follow-up:Plan follow-up as discussed or as needed if any worsening symptoms or change in condition. No Follow-up on file. Continue to follow with Binnie Rail, MD for routine health maintenance.   Susan Chavez and I agreed with plan.   Mable Paris, FNP  Portions of this note may have been constructed with voice recognition software.

## 2015-10-31 NOTE — Telephone Encounter (Signed)
Per CY-please contact patient to see if she can come in at 3:30pm today-I have held that spot.    I atc patient but was unable to reach patient or leave a message. Triage please attempt to contact patient again.

## 2015-10-31 NOTE — Patient Instructions (Signed)
Pleasure meeting you today. Let's start azithromycin and see if if that finall gets rid of your cough and sinus problems.  If there is no improvement in your symptoms, or if there is any worsening of symptoms, or if you have any additional concerns, please return for re-evaluation; or, if we are closed, consider going to the Emergency Room for evaluation if symptoms urgent.  Acute Bronchitis Bronchitis is inflammation of the airways that extend from the windpipe into the lungs (bronchi). The inflammation often causes mucus to develop. This leads to a cough, which is the most common symptom of bronchitis.  In acute bronchitis, the condition usually develops suddenly and goes away over time, usually in a couple weeks. Smoking, allergies, and asthma can make bronchitis worse. Repeated episodes of bronchitis may cause further lung problems.  CAUSES Acute bronchitis is most often caused by the same virus that causes a cold. The virus can spread from person to person (contagious) through coughing, sneezing, and touching contaminated objects. SIGNS AND SYMPTOMS   Cough.   Fever.   Coughing up mucus.   Body aches.   Chest congestion.   Chills.   Shortness of breath.   Sore throat.  DIAGNOSIS  Acute bronchitis is usually diagnosed through a physical exam. Your health care provider will also ask you questions about your medical history. Tests, such as chest X-rays, are sometimes done to rule out other conditions.  TREATMENT  Acute bronchitis usually goes away in a couple weeks. Oftentimes, no medical treatment is necessary. Medicines are sometimes given for relief of fever or cough. Antibiotic medicines are usually not needed but may be prescribed in certain situations. In some cases, an inhaler may be recommended to help reduce shortness of breath and control the cough. A cool mist vaporizer may also be used to help thin bronchial secretions and make it easier to clear the chest.  HOME  CARE INSTRUCTIONS  Get plenty of rest.   Drink enough fluids to keep your urine clear or pale yellow (unless you have a medical condition that requires fluid restriction). Increasing fluids may help thin your respiratory secretions (sputum) and reduce chest congestion, and it will prevent dehydration.   Take medicines only as directed by your health care provider.  If you were prescribed an antibiotic medicine, finish it all even if you start to feel better.  Avoid smoking and secondhand smoke. Exposure to cigarette smoke or irritating chemicals will make bronchitis worse. If you are a smoker, consider using nicotine gum or skin patches to help control withdrawal symptoms. Quitting smoking will help your lungs heal faster.   Reduce the chances of another bout of acute bronchitis by washing your hands frequently, avoiding people with cold symptoms, and trying not to touch your hands to your mouth, nose, or eyes.   Keep all follow-up visits as directed by your health care provider.  SEEK MEDICAL CARE IF: Your symptoms do not improve after 1 week of treatment.  SEEK IMMEDIATE MEDICAL CARE IF:  You develop an increased fever or chills.   You have chest pain.   You have severe shortness of breath.  You have bloody sputum.   You develop dehydration.  You faint or repeatedly feel like you are going to pass out.  You develop repeated vomiting.  You develop a severe headache. MAKE SURE YOU:   Understand these instructions.  Will watch your condition.  Will get help right away if you are not doing well or get worse.  This information is not intended to replace advice given to you by your health care provider. Make sure you discuss any questions you have with your health care provider.   Document Released: 08/13/2004 Document Revised: 07/27/2014 Document Reviewed: 12/27/2012 Elsevier Interactive Patient Education [employer].

## 2015-10-31 NOTE — Telephone Encounter (Signed)
Spoke with pt and she states that she thinks she has a sinus infection, her eyes are watering, sinus p&p, cough with colored mucus, legs aching and headache. Pt stated several times that she "is very sick". Pt states that she took the abx CY had sent in and that this was "not enough" and she needs this to be treated "more aggressively". Pt states she guessed she would just go to the ED for this and then very snidely asked me "Have you ever been to the ED?". To which I replied yes, I have and that the ED was probably not the best place for a sinus infection. She stated that she wants to be seen by CY today. I advised her that I could send him a message and see if he could work her in, to which she rudely replied "He won't see me anyway so nevermind" and hung up the phone. I did not have a chance to offer her anything else.   Sent to CY as FYI.

## 2015-10-31 NOTE — Telephone Encounter (Signed)
Spoke with pt and advised her that her message had been sent to [facility] and that he had offered to see her at 3:30pm today. Pt proceeded to tell me that "in the meantime" she has called and "gotten a new dr that can see her" as she is very sick and has a fever and CY "won't ever see her". Pt then said "thanks but no thanks" and hung up on me again.   CY was verbally advised of pt's response. Nothing further needed.

## 2015-11-03 ENCOUNTER — Emergency Department (HOSPITAL_COMMUNITY)
Admission: EM | Admit: 2015-11-03 | Discharge: 2015-11-03 | Disposition: A | Discharge status: Home / Self Care | Payer: Medicare Other | Attending: Emergency Medicine | Admitting: Emergency Medicine

## 2015-11-03 ENCOUNTER — Emergency Department (HOSPITAL_COMMUNITY): Payer: Medicare Other

## 2015-11-03 ENCOUNTER — Encounter (HOSPITAL_COMMUNITY): Payer: Self-pay | Admitting: *Deleted

## 2015-11-03 DIAGNOSIS — Z86010 Personal history of colonic polyps: Secondary | ICD-10-CM | POA: Diagnosis not present

## 2015-11-03 DIAGNOSIS — J40 Bronchitis, not specified as acute or chronic: Secondary | ICD-10-CM | POA: Diagnosis not present

## 2015-11-03 DIAGNOSIS — Z7952 Long term (current) use of systemic steroids: Secondary | ICD-10-CM | POA: Diagnosis not present

## 2015-11-03 DIAGNOSIS — Z8639 Personal history of other endocrine, nutritional and metabolic disease: Secondary | ICD-10-CM | POA: Insufficient documentation

## 2015-11-03 DIAGNOSIS — R509 Fever, unspecified: Secondary | ICD-10-CM | POA: Diagnosis not present

## 2015-11-03 DIAGNOSIS — J45901 Unspecified asthma with (acute) exacerbation: Secondary | ICD-10-CM | POA: Insufficient documentation

## 2015-11-03 DIAGNOSIS — R05 Cough: Secondary | ICD-10-CM | POA: Diagnosis not present

## 2015-11-03 DIAGNOSIS — Z79899 Other long term (current) drug therapy: Secondary | ICD-10-CM | POA: Diagnosis not present

## 2015-11-03 DIAGNOSIS — Z8619 Personal history of other infectious and parasitic diseases: Secondary | ICD-10-CM | POA: Insufficient documentation

## 2015-11-03 MED ORDER — ALBUTEROL SULFATE (2.5 MG/3ML) 0.083% IN NEBU
2.5000 mg | INHALATION_SOLUTION | Freq: Once | RESPIRATORY_TRACT | Status: AC
  Administered 2015-11-03: 2.5 mg via RESPIRATORY_TRACT
  Filled 2015-11-03: qty 3

## 2015-11-03 MED ORDER — PREDNISONE 10 MG PO TABS: 20.0000 mg | ORAL_TABLET | Freq: Every day | ORAL | Status: DC

## 2015-11-03 MED ORDER — IPRATROPIUM-ALBUTEROL 0.5-2.5 (3) MG/3ML IN SOLN
3.0000 mL | Freq: Once | RESPIRATORY_TRACT | Status: AC
  Administered 2015-11-03: 3 mL via RESPIRATORY_TRACT
  Filled 2015-11-03: qty 3

## 2015-11-03 MED ORDER — PREDNISONE 20 MG PO TABS
60.0000 mg | ORAL_TABLET | Freq: Once | ORAL | Status: AC
  Administered 2015-11-03: 60 mg via ORAL
  Filled 2015-11-03: qty 3

## 2015-11-03 MED ORDER — ALBUTEROL SULFATE HFA 108 (90 BASE) MCG/ACT IN AERS: 1.0000 | INHALATION_SPRAY | Freq: Four times a day (QID) | RESPIRATORY_TRACT | Status: DC | PRN

## 2015-11-03 MED ORDER — AMOXICILLIN 500 MG PO CAPS: 500.0000 mg | ORAL_CAPSULE | Freq: Three times a day (TID) | ORAL | Status: DC

## 2015-11-03 NOTE — Discharge Instructions (Signed)
Follow-up with your doctor later this week if not improving °

## 2015-11-03 NOTE — ED Provider Notes (Signed)
CSN: [id number]     Arrival date & time 11/03/15  [id number] History   First MD Initiated Contact with Patient 11/03/15 [phone]     Chief Complaint  Patient presents with  . Nasal Congestion  . Fever     (Consider location/radiation/quality/duration/timing/severity/associated sxs/prior Treatment) Patient is a 75 y.o. female presenting with fever. The history is provided by the patient (Patient complains of sinus congestion cough sputum production. She was put on Zithromax but has developed diarrhea).  Fever Temp source:  Oral Severity:  Mild Onset quality:  Gradual Timing:  Intermittent Progression:  Waxing and waning Chronicity:  New Relieved by:  Nothing Worsened by:  Exertion Ineffective treatments:  None tried Associated symptoms: no chest pain, no congestion, no cough, no diarrhea, no headaches and no rash     Past Medical History  Diagnosis Date  . Colon polyp 2003  . Hyperlipidemia   . Asthma   . Allergic rhinitis     former patient at [facility] Chest with allergy injections  . Polio   . seizure 1982    had a seizure x 1 75 yo, only one episode, ? really a seizure   Past Surgical History  Procedure Laterality Date  . Tonsillectomy    . Colonoscopy    . Patent ductus arterious repair      AGE 42  . Breast surgery      possible tumor-benign   Family History  Problem Relation Age of Onset  . Depression Mother   . Colon cancer Mother   . Depression Sister   . Alcohol abuse Father   . Breast cancer Paternal Grandmother   . Asthma Son   . Allergic rhinitis Son   . Pancreatic cancer Sister     2010  . Other Sister     polio  . Pancreatic cancer Sister    Social History  Substance Use Topics  . Smoking status: Never Smoker   . Smokeless tobacco: None  . Alcohol Use: Yes     Comment: 7 drinks per week   OB History    No data available     Review of Systems  Constitutional: Positive for fever. Negative for appetite change and fatigue.  HENT: Negative for  congestion, ear discharge and sinus pressure.   Eyes: Negative for discharge.  Respiratory: Positive for wheezing. Negative for cough.   Cardiovascular: Negative for chest pain.  Gastrointestinal: Negative for abdominal pain and diarrhea.  Genitourinary: Negative for frequency and hematuria.  Musculoskeletal: Negative for back pain.  Skin: Negative for rash.  Neurological: Negative for seizures and headaches.  Psychiatric/Behavioral: Negative for hallucinations.      Allergies  Tetanus toxoid  Home Medications   Prior to Admission medications   Medication Sig Start Date End Date Taking? Authorizing Provider  albuterol (PROVENTIL HFA;VENTOLIN HFA) 108 (90 Base) MCG/ACT inhaler Inhale 1-2 puffs into the lungs every 6 (six) hours as needed for wheezing or shortness of breath. 11/03/15   Milton Ferguson, MD  amoxicillin (AMOXIL) 500 MG capsule Take 1 capsule (500 mg total) by mouth 3 (three) times daily. 11/03/15   Milton Ferguson, MD  Ascorbic Acid (VITAMIN C) 1000 MG tablet Take 1,000 mg by mouth daily.    Historical Provider, MD  azithromycin (ZITHROMAX Z-PAK) 250 MG tablet Take 2 tablets ( total of 500 mg) PO on day 1, then 1 tablet ( total of 250 mg) PO q24 x 4 days. 10/31/15   Burnard Hawthorne, FNP  dextromethorphan-guaiFENesin [facility] DM) 30-600  MG per 12 hr tablet Take 1 tablet by mouth 2 (two) times daily as needed for cough.    Historical Provider, MD  DULERA 200-5 MCG/ACT AERO 2 PUFFS TWICE DAILY THEN RINSE MOUTH- MAINTENANCE 05/16/15   Deneise Lever, MD  ECHINACEA PO Take by mouth.    Historical Provider, MD  escitalopram (LEXAPRO) 10 MG tablet Take 0.5-1 tablets (5-10 mg total) by mouth daily. 10/31/15   Binnie Rail, MD  hydrochlorothiazide (MICROZIDE) 12.5 MG capsule Take 1 capsule (12.5 mg total) by mouth daily. 01/18/15   Hendricks Limes, MD  L-Lysine 500 MG TABS Take by mouth as needed.      Historical Provider, MD  montelukast (SINGULAIR) 10 MG tablet Take 1 tablet (10 mg  total) by mouth at bedtime. 09/04/15   Deneise Lever, MD  predniSONE (DELTASONE) 10 MG tablet Take 2 tablets (20 mg total) by mouth daily. 11/03/15   Milton Ferguson, MD  triamcinolone (NASACORT) 55 MCG/ACT AERO nasal inhaler Place 2 sprays into the nose daily.    Historical Provider, MD   BP 111/64 mmHg  Pulse 88  Temp(Src) 98.3 F (36.8 C) (Oral)  Resp 16  SpO2 96% Physical Exam  Constitutional: She is oriented to person, place, and time. She appears well-developed.  HENT:  Head: Normocephalic.  Eyes: Conjunctivae and EOM are normal. No scleral icterus.  Neck: Neck supple. No thyromegaly present.  Cardiovascular: Normal rate and regular rhythm.  Exam reveals no gallop and no friction rub.   No murmur heard. Pulmonary/Chest: No stridor. She has wheezes. She has no rales. She exhibits no tenderness.  Abdominal: She exhibits no distension. There is no tenderness. There is no rebound.  Musculoskeletal: Normal range of motion. She exhibits no edema.  Lymphadenopathy:    She has no cervical adenopathy.  Neurological: She is oriented to person, place, and time. She exhibits normal muscle tone. Coordination normal.  Skin: No rash noted. No erythema.  Psychiatric: She has a normal mood and affect. Her behavior is normal.    ED Course  Procedures (including critical care time) Labs Review Labs Reviewed - No data to display  Imaging Review Dg Chest 2 View  11/03/2015  CLINICAL DATA:  75 year old female with history of cough and fever for the past 3 days. EXAM: CHEST  2 VIEW COMPARISON:  Chest x-ray 04/16/2015. FINDINGS: Lung volumes are normal. Mild diffuse peribronchial cuffing. No consolidative airspace disease. No pleural effusions. No pneumothorax. No pulmonary nodule or mass noted. Pulmonary vasculature and the cardiomediastinal silhouette are within normal limits. Atherosclerosis in the thoracic aorta. IMPRESSION: 1. Mild diffuse peribronchial cuffing, suggestive of an acute bronchitis.  2. Atherosclerosis. Electronically Signed   By: Vinnie Langton M.D.   On: 11/03/2015 09:12   I have personally reviewed and evaluated these images and lab results as part of my medical decision-making.   EKG Interpretation None      MDM   Final diagnoses:  Bronchitis    Patient improved with neb treatment. Diagnosis sinusitis bronchitis also diarrhea secondary to azithromycin. Patient given prescription for albuterol inhaler prednisone and amoxicillin. She'll follow-up with her PCP   Milton Ferguson, MD 11/03/15 1010

## 2015-11-03 NOTE — ED Notes (Signed)
Pt reports URI since Wednesday, went to see her PCP Thursday, was given rx for Azithromycin-took 2 doses which made her have diarrhea and has had diarrhea since.  Pt reports every time she eats she has diarrhea.  Denies any nausea or vomiting.  Pt has hx of asthma.  Had one round of neb tx Thursday-was dx acute bronchitis.

## 2015-11-07 MED ORDER — ALBUTEROL SULFATE (2.5 MG/3ML) 0.083% IN NEBU
2.5000 mg | INHALATION_SOLUTION | Freq: Once | RESPIRATORY_TRACT | Status: AC
  Administered 2015-10-31: 2.5 mg via RESPIRATORY_TRACT

## 2015-11-07 NOTE — Addendum Note (Signed)
Addended by: Aviva Signs M on: 11/07/2015 11:42 AM   Modules accepted: Orders

## 2015-11-08 ENCOUNTER — Telehealth: Payer: Self-pay | Admitting: Internal Medicine

## 2015-11-08 ENCOUNTER — Encounter: Payer: Self-pay | Admitting: Internal Medicine

## 2015-11-08 ENCOUNTER — Ambulatory Visit (INDEPENDENT_AMBULATORY_CARE_PROVIDER_SITE_OTHER): Payer: Medicare Other | Admitting: Internal Medicine

## 2015-11-08 VITALS — BP 132/90 | HR 90 | Temp 98.7°F | Ht 64.0 in | Wt 128.0 lb

## 2015-11-08 DIAGNOSIS — J309 Allergic rhinitis, unspecified: Secondary | ICD-10-CM

## 2015-11-08 DIAGNOSIS — J209 Acute bronchitis, unspecified: Secondary | ICD-10-CM | POA: Diagnosis not present

## 2015-11-08 DIAGNOSIS — J302 Other seasonal allergic rhinitis: Secondary | ICD-10-CM

## 2015-11-08 DIAGNOSIS — J45901 Unspecified asthma with (acute) exacerbation: Secondary | ICD-10-CM | POA: Diagnosis not present

## 2015-11-08 DIAGNOSIS — J3089 Other allergic rhinitis: Secondary | ICD-10-CM

## 2015-11-08 MED ORDER — METHYLPREDNISOLONE ACETATE 80 MG/ML IJ SUSP
80.0000 mg | Freq: Once | INTRAMUSCULAR | Status: AC
  Administered 2015-11-08: 80 mg via INTRAMUSCULAR

## 2015-11-08 MED ORDER — LEVALBUTEROL HCL 0.63 MG/3ML IN NEBU
0.6300 mg | INHALATION_SOLUTION | Freq: Once | RESPIRATORY_TRACT | Status: AC
  Administered 2015-11-08: 0.63 mg via RESPIRATORY_TRACT

## 2015-11-08 NOTE — Assessment & Plan Note (Signed)
Timing suggests this may have begun as a viral infection aggravated by current high pollen levels. Plan-continue maintenance Dulera. Nebulizer treatment today with Xopenex, Depo-Medrol. Finish amoxicillin and prednisone.

## 2015-11-08 NOTE — Telephone Encounter (Signed)
Pt has been sick for 10 days.  Seen in ER on 11-03-15.  Given Amox 500 tid and Pred 20mg  qd.  Still c/o prod cough (clear), throat and chest congestion, wheezing, fatigue, lots of mucus.  Using neti pot and started Mucinex this am.  Pt not sure that she is getting better and wants to be seen.  She does not want something called in .  Please advise.  Allergies  Allergen Reactions  . Tetanus Toxoid     Eyelid edema FROM HORSES; therefore , she was told she could not take Tetanus shot.She has had tetanus shot w/o adverse effect since 2002    Current Outpatient Prescriptions on File Prior to Visit  Medication Sig Dispense Refill  . albuterol (PROVENTIL HFA;VENTOLIN HFA) 108 (90 Base) MCG/ACT inhaler Inhale 1-2 puffs into the lungs every 6 (six) hours as needed for wheezing or shortness of breath. 1 Inhaler 0  . amoxicillin (AMOXIL) 500 MG capsule Take 1 capsule (500 mg total) by mouth 3 (three) times daily. 21 capsule 0  . Ascorbic Acid (VITAMIN C) 1000 MG tablet Take 1,000 mg by mouth daily.    Marland Kitchen dextromethorphan-guaiFENesin (MUCINEX DM) 30-600 MG per 12 hr tablet Take 1 tablet by mouth 2 (two) times daily as needed for cough.    . DULERA 200-5 MCG/ACT AERO 2 PUFFS TWICE DAILY THEN RINSE MOUTH- MAINTENANCE 13 g 5  . ECHINACEA PO Take by mouth.    . escitalopram (LEXAPRO) 10 MG tablet Take 0.5-1 tablets (5-10 mg total) by mouth daily. 90 tablet 1  . hydrochlorothiazide (MICROZIDE) 12.5 MG capsule Take 1 capsule (12.5 mg total) by mouth daily. 30 capsule 5  . L-Lysine 500 MG TABS Take by mouth as needed.      . montelukast (SINGULAIR) 10 MG tablet Take 1 tablet (10 mg total) by mouth at bedtime. 30 tablet 5  . predniSONE (DELTASONE) 10 MG tablet Take 2 tablets (20 mg total) by mouth daily. 14 tablet 0  . triamcinolone (NASACORT) 55 MCG/ACT AERO nasal inhaler Place 2 sprays into the nose daily.     No current facility-administered medications on file prior to visit.

## 2015-11-08 NOTE — Telephone Encounter (Signed)
Spoke with pt and given appt with Dr Annamaria Boots today at 11:15.

## 2015-11-08 NOTE — Patient Instructions (Signed)
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 If the cough is just exhausting and you need something more, let us know  Finish the amoxacillin and prednisone  Call us Monday as needed

## 2015-11-08 NOTE — Progress Notes (Signed)
08/01/13- 75 yoF never smoker  Self referral-asthma; constant cough per patient. Long history of asthma. Was an allergy vaccine patient of Dr. Gerilyn Nestle for several years and says it helped. Cough has been worse for last 5 months, worse in cold weather and better in steam. No other triggers recognized. Cough productive clear phlegm. She denies nasal congestion or drainage, using Nasacort. Chest feels tight. Recognizes GERD especially at night. She describes raspy feeling in her upper airway. Now on a prednisone taper. Has lived in her current house for 3 years with no smokers, gas heat, some carpet, no pets, no mold. Son has asthma. CXR 07/28/13 IMPRESSION:  No active cardiopulmonary disease.  Electronically Signed  By: [name]  On: 07/28/2013 13:57 PFT 10/06/2007- WNL. FVC 2.66/ 88%, FEV1 2.14/ 98%, FEV1/FVC 0.8, TLC 105%, DLCO 98%  10/10/13- 75 yoF never smoker  Self referral-asthma; constant cough per patient. FOLLOWS [id number] is working well for the patient; review labs in detail with patient. Still having a deeper voice than usual. Allergy wise she is doing well-1 sinus episode and 1 time had clogged up sinus area with windy days Feels happy with status, well now. Minimal cough. Allergy Profile 08/01/13- Total IgE 151.4, Broad elevations for many common environmental allergens.  03/29/14- 75 yoF never smoker  Self referral-asthma; constant cough per patient. FOLLOWS FOR:  acute visit today.  congestion and wheezing. much better today.   1 week of acute onset wheeze, sore throat, no fever, clear mucus/ productive cough. She resumed Singulair. Better today. Going to [place] with husb who was on chemoRx for lymphoma.  04/20/2014 Acute OV  Complains of increased SOB, wheezing, prod cough with white/clear mucus, head congestion, bilateral ear congestion, HA x5 days.  Finished zpak yesterday.   Denies any f/c/s, n/v/d, hemoptysis, chest pain, orthopnea, calf pain .  Had mild URI 1 month  ago, resolved shortly and felt much better until 5 days ago.  Says several people on the trip got cold with cough and congestion . Husband has similar symptoms.  On Dulera but out for 2 weeks .  Coughing up thick white mucus . Cough is keeping her up at night and has daytime coughing fit.   04/27/14-75 yoF never smoker followed for asthma, chronic cough, allergic rhinitis ACUTE VISIT: Seen 04-20-14 by TP; fatigued, Chest tightness with wheezing, cough-productive, nasal drainage. Denies any fevers.  Current illness began as a definite bad cold while on bus trip with husband. Seen on 10/2 by TP. Improved some after taking pred taper, augmentin, restarting Dulera, occasional use of cough syrup. Still active cough productive white, nasal congestion with white, persistent soreness L upper anterior chest wall. Denies fever, blood, nodes, palpitation, leg pain or swelling. >zpack   09/21/2014 Acute OV : never smoker , asthma , AR and chronic cough  Patient presents for an acute office visit. Complains of cough, sore throat, nasal congestion, intermittent wheezing, nasal drainage and sinus congestion. She denies any hemoptysis, chest pain, orthopnea, PND or leg swelling Was called in a prednisone pack 2 weeks ago. Feels that it helps some, but she continues to have cough and congestion.  07/24/2015- 75 yoyoF never smoker followed for asthma, chronic cough, allergic rhinitisF FOLLOWS FOR:  No new complaints. Saw ENT this past year and was diagnosed with reflux, treatment given and cough has improved. Some clear foamy mucus production with asthma flares.   CXR 04/16/2015 IMPRESSION: Hyperinflation consistent with known reactive airway disease. There is no evidence of pneumonia nor  other acute cardiopulmonary abnormality. Electronically Signed  By: David Martinique M.D.  On: 04/16/2015 16:46  11/08/2015-75 year old female never smoker followed for asthma, chronic cough, allergic rhinitis ACUTE VISIT: Pt  continues to have congestion, cough, and out of breath since later part of March-has been given abx's and breathing treatments. Just can not shake this. ER visit 11/03/2015-nasal congestion and fever. Z-Pak had caused diarrhea. She was treated with nebulizer, prescriptions for albuterol inhaler, prednisone, amoxicillin and told to follow-up with PCP. Onset 1 month ago of an acute illness, initially febrile with cough and malaise, discolored sputum. Nasal congestion and chest congestion. Copious mucus. Neti pot helped. We sent amoxicillin. PCP then gave neb treatment and Z-Pak. She blamed a Z-Pak for loose stools. ER visit as above. 2 days left each of prednisone and amoxicillin. Mucus color has cleared but remains productive head and chest. Declines cough medicine prescription. CXR 11/03/2015-images reviewed with her and her husband IMPRESSION: 1. Mild diffuse peribronchial cuffing, suggestive of an acute bronchitis. 2. Atherosclerosis. Electronically Signed  By: Vinnie Langton M.D.  On: 11/03/2015 09:12  ROS-see HPI Constitutional:   No-   weight loss, night sweats, fevers, chills, fatigue, lassitude. HEENT:   No-  headaches, difficulty swallowing, tooth/dental problems, +sore throat,       No-  sneezing, itching, ear ache, +nasal congestion, +post nasal drip,  CV:  No-   chest pain, orthopnea, PND, swelling in lower extremities, anasarca,                                                         dizziness, palpitations Resp: +  shortness of breath with exertion or at rest.             + productive cough,  + non-productive cough,  No- coughing up of blood.              No-   change in color of mucus.  No- wheezing.   Skin: No-   rash or lesions. GI:  No- heartburn, indigestion, abdominal pain, nausea, vomiting,  GU:  MS:  No-   joint pain or swelling.   Neuro-     nothing unusual Psych:  No- change in mood or affect. No depression +anxiety.  No memory loss.  OBJ- Physical  Exam General- Alert, Oriented, Affect-appropriate, Distress- none acute, trim, alert Skin- rash-none, lesions- none, excoriation- none Lymphadenopathy- none Head- atraumatic            Eyes- Gross vision intact, PERRLA, conjunctivae and secretions clear            Ears- Hearing, canals-normal            Nose- Clear, no-Septal dev, mucus, polyps, erosion, perforation             Throat- Mallampati II , mucosa clear , drainage- none, tonsils- atrophic Neck- flexible , trachea midline, no stridor , thyroid nl, carotid no bruit Chest - symmetrical excursion , unlabored           Heart/CV- RRR , no murmur , no gallop  , no rub, nl s1 s2                           - JVD- none , edema- none, stasis changes- none, varices- none  Lung- + bilateral coarse rhonchi/wheeze , + active cough, dullness-none, rub- none           Chest wall-  Abd-  Br/ Gen/ Rectal- Not done, not indicated Extrem- cyanosis- none, clubbing, none, atrophy- none, strength- nl Neuro- grossly intact to observation

## 2015-11-08 NOTE — Assessment & Plan Note (Signed)
Exacerbation of rhinitis with bronchitis. Likely combined viral and allergic inflammation. Plan Depo-Medrol

## 2015-11-14 ENCOUNTER — Telehealth: Payer: Self-pay | Admitting: Internal Medicine

## 2015-11-14 NOTE — Telephone Encounter (Signed)
Per CY-lets have patient come in on Friday 11-15-15 at 11:30am. Thanks.

## 2015-11-14 NOTE — Telephone Encounter (Signed)
Spoke with pt and she states she still has cough with congestion with foamy/white mucus, ear pain (L more), wheeze and SOB. Pt has been using Alb HFA, neti pot and Mucinex. Pt has finished abx/pred. Pt would like to be seen tomorrow.   CY please advise. Thank you!   Allergies  Allergen Reactions  . Tetanus Toxoid     Eyelid edema FROM HORSES; therefore , she was told she could not take Tetanus shot.She has had tetanus shot w/o adverse effect since 2002   Current Outpatient Prescriptions on File Prior to Visit  Medication Sig Dispense Refill  . albuterol (PROVENTIL HFA;VENTOLIN HFA) 108 (90 Base) MCG/ACT inhaler Inhale 1-2 puffs into the lungs every 6 (six) hours as needed for wheezing or shortness of breath. 1 Inhaler 0  . amoxicillin (AMOXIL) 500 MG capsule Take 1 capsule (500 mg total) by mouth 3 (three) times daily. 21 capsule 0  . Ascorbic Acid (VITAMIN C) 1000 MG tablet Take 1,000 mg by mouth daily.    . DULERA 200-5 MCG/ACT AERO 2 PUFFS TWICE DAILY THEN RINSE MOUTH- MAINTENANCE 13 g 5  . ECHINACEA PO Take by mouth.    . escitalopram (LEXAPRO) 10 MG tablet Take 0.5-1 tablets (5-10 mg total) by mouth daily. 90 tablet 1  . guaiFENesin (MUCINEX) 600 MG 12 hr tablet Take 600 mg by mouth 2 (two) times daily.    . hydrochlorothiazide (MICROZIDE) 12.5 MG capsule Take 1 capsule (12.5 mg total) by mouth daily. 30 capsule 5  . L-Lysine 500 MG TABS Take by mouth as needed.      . montelukast (SINGULAIR) 10 MG tablet Take 1 tablet (10 mg total) by mouth at bedtime. 30 tablet 5  . predniSONE (DELTASONE) 10 MG tablet Take 2 tablets (20 mg total) by mouth daily. 14 tablet 0  . triamcinolone (NASACORT) 55 MCG/ACT AERO nasal inhaler Place 2 sprays into the nose daily.     No current facility-administered medications on file prior to visit.

## 2015-11-14 NOTE — Telephone Encounter (Signed)
Spoke with pt and she is fine with 4/28 @ 11:30am. Appt scheduled.

## 2015-11-15 ENCOUNTER — Ambulatory Visit (INDEPENDENT_AMBULATORY_CARE_PROVIDER_SITE_OTHER): Payer: Medicare Other | Admitting: Internal Medicine

## 2015-11-15 ENCOUNTER — Encounter: Payer: Self-pay | Admitting: Internal Medicine

## 2015-11-15 VITALS — BP 122/76 | HR 54 | Ht 64.0 in | Wt 129.4 lb

## 2015-11-15 DIAGNOSIS — J45901 Unspecified asthma with (acute) exacerbation: Secondary | ICD-10-CM

## 2015-11-15 DIAGNOSIS — J302 Other seasonal allergic rhinitis: Secondary | ICD-10-CM

## 2015-11-15 DIAGNOSIS — J3089 Other allergic rhinitis: Secondary | ICD-10-CM

## 2015-11-15 DIAGNOSIS — J309 Allergic rhinitis, unspecified: Secondary | ICD-10-CM

## 2015-11-15 MED ORDER — BENZONATATE 200 MG PO CAPS
200.0000 mg | ORAL_CAPSULE | Freq: Three times a day (TID) | ORAL | Status: DC | PRN
Start: 2015-11-15 — End: 2016-05-21

## 2015-11-15 MED ORDER — PREDNISONE 10 MG PO TABS: 10.0000 mg | ORAL_TABLET | Freq: Every day | ORAL | Status: DC

## 2015-11-15 NOTE — Assessment & Plan Note (Signed)
There may be a pollen allergy component to her current rhinitis and bronchitis Plan-sample Dymista nasal spray, Sudafed

## 2015-11-15 NOTE — Patient Instructions (Signed)
Script sent for prednisone to take 10 mg dailyfor a week  Script sent for benzonatate perles for cough- 1 every 8 hours if needed. You can supplement with otc cough syrup and throat lozenges as needed  Suggest Sudafed from pharmacy as needed for ear pain or head congestion  Sample Dymista nasal spray    1-2 puffs each nostril once daily at bedtime

## 2015-11-15 NOTE — Assessment & Plan Note (Signed)
Slow clearing of an acute bronchitis with upper respiratory infection. Plan-prednisone 10 mg daily 7 days, benzonatate Perles

## 2015-11-15 NOTE — Progress Notes (Signed)
08/01/13- 31 yoF never smoker  Self referral-asthma; constant cough per patient. Long history of asthma. Was an allergy vaccine patient of Dr. Gerilyn Nestle for several years and says it helped. Cough has been worse for last 5 months, worse in cold weather and better in steam. No other triggers recognized. Cough productive clear phlegm. She denies nasal congestion or drainage, using Nasacort. Chest feels tight. Recognizes GERD especially at night. She describes raspy feeling in her upper airway. Now on a prednisone taper. Has lived in her current house for 3 years with no smokers, gas heat, some carpet, no pets, no mold. Son has asthma. CXR 07/28/13 IMPRESSION:  No active cardiopulmonary disease.  Electronically Signed  By: [name]  On: 07/28/2013 13:57 PFT 10/06/2007- WNL. FVC 2.66/ 88%, FEV1 2.14/ 98%, FEV1/FVC 0.8, TLC 105%, DLCO 98%  10/10/13- 72 yoF never smoker  Self referral-asthma; constant cough per patient. FOLLOWS [id number] is working well for the patient; review labs in detail with patient. Still having a deeper voice than usual. Allergy wise she is doing well-1 sinus episode and 1 time had clogged up sinus area with windy days Feels happy with status, well now. Minimal cough. Allergy Profile 08/01/13- Total IgE 151.4, Broad elevations for many common environmental allergens.  03/29/14- 73 yoF never smoker  Self referral-asthma; constant cough per patient. FOLLOWS FOR:  acute visit today.  congestion and wheezing. much better today.   1 week of acute onset wheeze, sore throat, no fever, clear mucus/ productive cough. She resumed Singulair. Better today. Going to [place] with husb who was on chemoRx for lymphoma.  04/20/2014 Acute OV  Complains of increased SOB, wheezing, prod cough with white/clear mucus, head congestion, bilateral ear congestion, HA x5 days.  Finished zpak yesterday.   Denies any f/c/s, n/v/d, hemoptysis, chest pain, orthopnea, calf pain .  Had mild URI 1 month  ago, resolved shortly and felt much better until 5 days ago.  Says several people on the trip got cold with cough and congestion . Husband has similar symptoms.  On Dulera but out for 2 weeks .  Coughing up thick white mucus . Cough is keeping her up at night and has daytime coughing fit.   04/27/14-73 yoF never smoker followed for asthma, chronic cough, allergic rhinitis ACUTE VISIT: Seen 04-20-14 by TP; fatigued, Chest tightness with wheezing, cough-productive, nasal drainage. Denies any fevers.  Current illness began as a definite bad cold while on bus trip with husband. Seen on 10/2 by TP. Improved some after taking pred taper, augmentin, restarting Dulera, occasional use of cough syrup. Still active cough productive white, nasal congestion with white, persistent soreness L upper anterior chest wall. Denies fever, blood, nodes, palpitation, leg pain or swelling. >zpack   09/21/2014 Acute OV : never smoker , asthma , AR and chronic cough  Patient presents for an acute office visit. Complains of cough, sore throat, nasal congestion, intermittent wheezing, nasal drainage and sinus congestion. She denies any hemoptysis, chest pain, orthopnea, PND or leg swelling Was called in a prednisone pack 2 weeks ago. Feels that it helps some, but she continues to have cough and congestion.  07/24/2015- 40 yoyoF never smoker followed for asthma, chronic cough, allergic rhinitisF FOLLOWS FOR:  No new complaints. Saw ENT this past year and was diagnosed with reflux, treatment given and cough has improved. Some clear foamy mucus production with asthma flares.   CXR 04/16/2015 IMPRESSION: Hyperinflation consistent with known reactive airway disease. There is no evidence of pneumonia nor  other acute cardiopulmonary abnormality. Electronically Signed  By: David Martinique M.D.  On: 04/16/2015 16:46  11/08/2015-75 year old female never smoker followed for asthma, chronic cough, allergic rhinitis ACUTE VISIT: Pt  continues to have congestion, cough, and out of breath since later part of March-has been given abx's and breathing treatments. Just can not shake this. ER visit 11/03/2015-nasal congestion and fever. Z-Pak had caused diarrhea. She was treated with nebulizer, prescriptions for albuterol inhaler, prednisone, amoxicillin and told to follow-up with PCP. Onset 1 month ago of an acute illness, initially febrile with cough and malaise, discolored sputum. Nasal congestion and chest congestion. Copious mucus. Neti pot helped. We sent amoxicillin. PCP then gave neb treatment and Z-Pak. She blamed a Z-Pak for loose stools. ER visit as above. 2 days left each of prednisone and amoxicillin. Mucus color has cleared but remains productive head and chest. Declines cough medicine prescription. CXR 11/03/2015-images reviewed with her and her husband IMPRESSION: 1. Mild diffuse peribronchial cuffing, suggestive of an acute bronchitis. 2. Atherosclerosis. Electronically Signed  By: Vinnie Langton M.D.  On: 11/03/2015 09:12  11/15/2015-75 year old female never smoker followed for asthma, chronic cough, allergic rhinitis ACUTE VISIT:Pt has completed abx and prednisone-still having congestion, SOB, wheeze, Left ear pain has worsened.  We gave nebulizer treatment and Depo-Medrol  last visit. She had finished amoxicillin and prednisone taper. Still coughing but sputum is now white. Left ear hurt yesterday, better today, hearing okay.  ROS-see HPI Constitutional:   No-   weight loss, night sweats, fevers, chills, fatigue, lassitude. HEENT:   No-  headaches, difficulty swallowing, tooth/dental problems, +sore throat,       No-  sneezing, itching, ear ache, +nasal congestion, +post nasal drip,  CV:  No-   chest pain, orthopnea, PND, swelling in lower extremities, anasarca,                                                         dizziness, palpitations Resp: +  shortness of breath with exertion or at rest.              + productive cough,  + non-productive cough,  No- coughing up of blood.              No-   change in color of mucus.  No- wheezing.   Skin: No-   rash or lesions. GI:  No- heartburn, indigestion, abdominal pain, nausea, vomiting,  GU:  MS:  No-   joint pain or swelling.   Neuro-     nothing unusual Psych:  No- change in mood or affect. No depression +anxiety.  No memory loss.  OBJ- Physical Exam General- Alert, Oriented, Affect-appropriate, Distress- none acute, trim, alert Skin- rash-none, lesions- none, excoriation- none Lymphadenopathy- none Head- atraumatic            Eyes- Gross vision intact, PERRLA, conjunctivae and secretions clear            Ears- Hearing, canals-normal            Nose- Clear, no-Septal dev, mucus, polyps, erosion, perforation             Throat- Mallampati II , mucosa clear , drainage- none, tonsils- atrophic Neck- flexible , trachea midline, no stridor , thyroid nl, carotid no bruit Chest - symmetrical excursion , unlabored  Heart/CV- RRR , no murmur , no gallop  , no rub, nl s1 s2                           - JVD- none , edema- none, stasis changes- none, varices- none           Lung- + minimal rhonchi in bases , + active cough, dullness-none, rub- none           Chest wall-  Abd-  Br/ Gen/ Rectal- Not done, not indicated Extrem- cyanosis- none, clubbing, none, atrophy- none, strength- nl Neuro- grossly intact to observation

## 2015-11-20 ENCOUNTER — Encounter: Payer: Self-pay | Admitting: Internal Medicine

## 2015-11-20 ENCOUNTER — Ambulatory Visit (INDEPENDENT_AMBULATORY_CARE_PROVIDER_SITE_OTHER): Payer: Medicare Other | Admitting: Internal Medicine

## 2015-11-20 VITALS — BP 120/82 | HR 77 | Temp 97.6°F | Resp 16 | Wt 128.0 lb

## 2015-11-20 DIAGNOSIS — I10 Essential (primary) hypertension: Secondary | ICD-10-CM | POA: Diagnosis not present

## 2015-11-20 DIAGNOSIS — J45901 Unspecified asthma with (acute) exacerbation: Secondary | ICD-10-CM | POA: Diagnosis not present

## 2015-11-20 DIAGNOSIS — G479 Sleep disorder, unspecified: Secondary | ICD-10-CM

## 2015-11-20 MED ORDER — ZOLPIDEM TARTRATE 5 MG PO TABS: 5.0000 mg | ORAL_TABLET | Freq: Every evening | ORAL | Status: DC | PRN

## 2015-11-20 MED ORDER — HYDROCHLOROTHIAZIDE 12.5 MG PO CAPS: 12.5000 mg | ORAL_CAPSULE | Freq: Every day | ORAL | Status: DC

## 2015-11-20 NOTE — Assessment & Plan Note (Signed)
Traveling soon to [place] only if needed

## 2015-11-20 NOTE — Progress Notes (Signed)
Subjective:    Patient ID: Susan Chavez, female    DOB: 06-25-1941, 75 y.o.   MRN: [id number]  HPI She is here for follow up.  Hypertension: She is taking her medication daily. She is compliant with a low sodium diet.  She denies chest pain, palpitations, edema, shortness of breath and regular headaches. She is exercising regularly.    Asthma, allergies:  She was recently diagnosed with asthmatic bronchitis with acute exacerbation and is still on prednisone.  She was recently saw pulmonary and he put her on an additional 7 days of prednisone, which will end tomorrow. She does finally feel better, but is still not a 100 percent. This illness lasted 3 weeks. She is still experiencing some wheezing and coughing. She has not been doing much it is unsure if she is short of breath or not. She denies fevers. She still has some nasal congestion and postnasal drip.  She is taking all of the recommended medication and her inhalers. She goes on vacation in one week.  She wonders about taking a sleeping medication with her on vacation to use if needed.  She will be going to [place] and is worried about the time change.   Medications and allergies reviewed with patient and updated if appropriate.  Patient Active Problem List   Diagnosis Date Noted  . Asthmatic bronchitis with acute exacerbation 05/29/2015  . Osteopenia 01/18/2015  . Essential hypertension 10/24/2014  . S/P repair of patent ductus arteriosus 12/11/2010  . BENIGN POSITIONAL VERTIGO 06/09/2010  . OVARIAN CYST 06/09/2010  . History of colonic polyps 09/14/2008  . Seasonal and perennial allergic rhinitis 07/25/2007  . Hyperlipidemia 10/14/2006    Current Outpatient Prescriptions on File Prior to Visit  Medication Sig Dispense Refill  . albuterol (PROVENTIL HFA;VENTOLIN HFA) 108 (90 Base) MCG/ACT inhaler Inhale 1-2 puffs into the lungs every 6 (six) hours as needed for wheezing or shortness of breath. 1 Inhaler 0  . Ascorbic Acid  (VITAMIN C) 1000 MG tablet Take 1,000 mg by mouth daily.    . benzonatate (TESSALON) 200 MG capsule Take 1 capsule (200 mg total) by mouth 3 (three) times daily as needed for cough. 30 capsule 1  . DULERA 200-5 MCG/ACT AERO 2 PUFFS TWICE DAILY THEN RINSE MOUTH- MAINTENANCE 13 g 5  . ECHINACEA PO Take by mouth.    . escitalopram (LEXAPRO) 10 MG tablet Take 0.5-1 tablets (5-10 mg total) by mouth daily. 90 tablet 1  . hydrochlorothiazide (MICROZIDE) 12.5 MG capsule Take 1 capsule (12.5 mg total) by mouth daily. 30 capsule 5  . L-Lysine 500 MG TABS Take by mouth as needed.      . montelukast (SINGULAIR) 10 MG tablet Take 1 tablet (10 mg total) by mouth at bedtime. 30 tablet 5  . predniSONE (DELTASONE) 10 MG tablet Take 1 tablet (10 mg total) by mouth daily with breakfast. 7 tablet 0  . Sodium Chloride-Sodium Bicarb (NETI POT SINUS [place] NA) Place into the nose.    . triamcinolone (NASACORT) 55 MCG/ACT AERO nasal inhaler Place 2 sprays into the nose daily. Reported on 11/15/2015     No current facility-administered medications on file prior to visit.    Past Medical History  Diagnosis Date  . Colon polyp 2003  . Hyperlipidemia   . Asthma   . Allergic rhinitis     former patient at [facility] Chest with allergy injections  . Polio   . seizure 1982    had a seizure x  1 75 yo, only one episode, ? really a seizure    Past Surgical History  Procedure Laterality Date  . Tonsillectomy    . Colonoscopy    . Patent ductus arterious repair      AGE 61  . Breast surgery      possible tumor-benign    Social History   Social History  . Marital Status: Married    Spouse Name: N/A  . Number of Children: 2  . Years of Education: N/A   Occupational History  . teacher-small business owner-retired    Social History Main Topics  . Smoking status: Never Smoker   . Smokeless tobacco: Not on file  . Alcohol Use: Yes     Comment: 7 drinks per week  . Drug Use: No  . Sexual Activity: Not on file    Other Topics Concern  . Not on file   Social History Narrative   Lives with spouse; has grand children    Family History  Problem Relation Age of Onset  . Depression Mother   . Colon cancer Mother   . Depression Sister   . Alcohol abuse Father   . Breast cancer Paternal Grandmother   . Asthma Son   . Allergic rhinitis Son   . Pancreatic cancer Sister     2010  . Other Sister     polio  . Pancreatic cancer Sister     Review of Systems  Constitutional: Negative for fever and appetite change.  HENT: Positive for congestion, postnasal drip and sinus pressure. Negative for ear pain (ears plugged).   Respiratory: Positive for cough (sometimes productive) and wheezing. Negative for shortness of breath.   Cardiovascular: Negative for chest pain, palpitations and leg swelling.  Neurological: Negative for dizziness, light-headedness and headaches.  Psychiatric/Behavioral: Self-injury: .seeprob.       Objective:   Filed Vitals:   11/20/15 0846  BP: 120/82  Pulse: 77  Temp: 97.6 F (36.4 C)  Resp: 16   Filed Weights   11/20/15 0846  Weight: 128 lb (58.06 kg)   Body mass index is 21.96 kg/(m^2).   Physical Exam GENERAL APPEARANCE: Appears stated age, well appearing, NAD EYES: conjunctiva clear, no icterus HEENT: bilateral tympanic membranes and ear canals normal, oropharynx with no erythema, no thyromegaly, trachea midline, no cervical or supraclavicular lymphadenopathy LUNGS: Clear to auscultation without wheeze or crackles, unlabored breathing, good air entry bilaterally HEART: Normal S1,S2 without murmurs EXTREMITIES: Without clubbing, cyanosis, or edema      Assessment & Plan:   Asthmatic bronchitis with acute exacerbation: About to finish steroids Overall symptoms improving and continue to improve Continue inhaler, regular medications and nasal sprays If she does not continue to see improvement she will call   See Problem List for Assessment and Plan of  chronic medical problems.  Follow up in 6 months for a PE

## 2015-11-20 NOTE — Patient Instructions (Addendum)
   All other Health Maintenance issues reviewed.   All recommended immunizations and age-appropriate screenings are up-to-date or discussed.  No immunizations administered today.   Medications reviewed and updated.  No changes recommended at this time.  Your prescription(s) have been submitted to your pharmacy. Please take as directed and contact our office if you believe you are having problem(s) with the medication(s).   Please followup in 6 months for a physical   

## 2015-11-20 NOTE — Assessment & Plan Note (Signed)
BP well controlled Current regimen effective and well tolerated Continue current medications at current doses  

## 2015-11-20 NOTE — Progress Notes (Signed)
Pre visit review using our clinic review tool, if applicable. No additional management support is needed unless otherwise documented below in the visit note. 

## 2015-12-26 ENCOUNTER — Other Ambulatory Visit: Payer: Self-pay | Admitting: *Deleted

## 2015-12-26 MED ORDER — ALBUTEROL SULFATE HFA 108 (90 BASE) MCG/ACT IN AERS: 1.0000 | INHALATION_SPRAY | Freq: Four times a day (QID) | RESPIRATORY_TRACT | Status: DC | PRN

## 2016-04-06 ENCOUNTER — Ambulatory Visit: Payer: Medicare Other

## 2016-04-07 ENCOUNTER — Ambulatory Visit (INDEPENDENT_AMBULATORY_CARE_PROVIDER_SITE_OTHER): Payer: Medicare Other

## 2016-04-07 DIAGNOSIS — Z23 Encounter for immunization: Secondary | ICD-10-CM | POA: Diagnosis not present

## 2016-04-13 ENCOUNTER — Telehealth: Payer: Self-pay | Admitting: Internal Medicine

## 2016-04-13 ENCOUNTER — Other Ambulatory Visit: Payer: Self-pay | Admitting: Internal Medicine

## 2016-04-13 MED ORDER — MONTELUKAST SODIUM 10 MG PO TABS: 10.0000 mg | ORAL_TABLET | Freq: Every day | ORAL | 11 refills | Status: DC

## 2016-04-13 MED ORDER — PREDNISONE 10 MG PO TABS: ORAL_TABLET | ORAL | 0 refills | Status: DC

## 2016-04-13 NOTE — Telephone Encounter (Signed)
Suggest prednisone 10 mg, # 20, 4 X 2 DAYS, 3 X 2 DAYS, 2 X 2 DAYS, 1 X 2 DAYS   I think any of our newer doctors would do a good job for her.

## 2016-04-13 NOTE — Telephone Encounter (Signed)
Called spoke with pt. Reviewed CY's recs and verified pharmacy as [facility]. She voiced understanding and had no further questions. Rx has been sent to the pharmacy. Nothing further needed.

## 2016-04-13 NOTE — Telephone Encounter (Signed)
Spoke with pt. States that she is having an asthma flare up. Reports SOB, wheezing and coughing. Cough is producing "foamy" mucus. Denies chest tightness, fever. Would like CY's recommendations. She would also like to know who CY would like her to see once he's retired.  Allergies  Allergen Reactions  . Tetanus Toxoid     Eyelid edema FROM HORSES; therefore , she was told she could not take Tetanus shot.She has had tetanus shot w/o adverse effect since 2002   Current Outpatient Prescriptions on File Prior to Visit  Medication Sig Dispense Refill  . albuterol (PROVENTIL HFA;VENTOLIN HFA) 108 (90 Base) MCG/ACT inhaler Inhale 1-2 puffs into the lungs every 6 (six) hours as needed for wheezing or shortness of breath. 1 Inhaler 2  . Ascorbic Acid (VITAMIN C) 1000 MG tablet Take 1,000 mg by mouth daily.    . benzonatate (TESSALON) 200 MG capsule Take 1 capsule (200 mg total) by mouth 3 (three) times daily as needed for cough. 30 capsule 1  . DULERA 200-5 MCG/ACT AERO 2 PUFFS TWICE DAILY THEN RINSE MOUTH- MAINTENANCE 13 g 5  . ECHINACEA PO Take by mouth.    . escitalopram (LEXAPRO) 10 MG tablet Take 0.5-1 tablets (5-10 mg total) by mouth daily. 90 tablet 1  . hydrochlorothiazide (MICROZIDE) 12.5 MG capsule Take 1 capsule (12.5 mg total) by mouth daily. 90 capsule 3  . L-Lysine 500 MG TABS Take by mouth as needed.      . montelukast (SINGULAIR) 10 MG tablet Take 1 tablet (10 mg total) by mouth at bedtime. 30 tablet 11  . predniSONE (DELTASONE) 10 MG tablet Take 1 tablet (10 mg total) by mouth daily with breakfast. 7 tablet 0  . Sodium Chloride-Sodium Bicarb (NETI POT SINUS [place] NA) Place into the nose.    . triamcinolone (NASACORT) 55 MCG/ACT AERO nasal inhaler Place 2 sprays into the nose daily. Reported on 11/15/2015    . zolpidem (AMBIEN) 5 MG tablet Take 1 tablet (5 mg total) by mouth at bedtime as needed for sleep. 30 tablet 0   No current facility-administered medications on file prior to  visit.     CY - please advise. Thanks.

## 2016-05-07 DIAGNOSIS — L820 Inflamed seborrheic keratosis: Secondary | ICD-10-CM | POA: Diagnosis not present

## 2016-05-07 DIAGNOSIS — D485 Neoplasm of uncertain behavior of skin: Secondary | ICD-10-CM | POA: Diagnosis not present

## 2016-05-07 DIAGNOSIS — L84 Corns and callosities: Secondary | ICD-10-CM | POA: Diagnosis not present

## 2016-05-07 DIAGNOSIS — L570 Actinic keratosis: Secondary | ICD-10-CM | POA: Diagnosis not present

## 2016-05-07 DIAGNOSIS — L918 Other hypertrophic disorders of the skin: Secondary | ICD-10-CM | POA: Diagnosis not present

## 2016-05-08 ENCOUNTER — Other Ambulatory Visit: Payer: Self-pay | Admitting: Internal Medicine

## 2016-05-18 ENCOUNTER — Telehealth: Payer: Self-pay | Admitting: Internal Medicine

## 2016-05-18 NOTE — Telephone Encounter (Signed)
Pt seen 1.5.17 for routine follow up: Instructions     Return in about 1 year (around 07/23/2016).  Ok to try off montelukast, restarting as needed   We will watch as the spring pollens come in late February   Pt was seen 4.21.17 and 4.28.17 for acute visits, no changes in therapy were made Called spoke with patient who reported that she had done well off of her Singulair until the recent season change when she began to notice "a big difference in her breathing capacity."  Pt mentioned that she would prefer to see CY rather than just starting the Singulair back, but when she called previously for an appt there were no openings until Jan 2018 for her regular follow up.  Checking now, CY does have an opening this week 11.2.17 @ 0930 > appt scheduled.  She will hold off on restarting the Singulair until she sees CY.  Nothing further needed; will sign off.

## 2016-05-21 ENCOUNTER — Encounter: Payer: Self-pay | Admitting: Internal Medicine

## 2016-05-21 ENCOUNTER — Ambulatory Visit (INDEPENDENT_AMBULATORY_CARE_PROVIDER_SITE_OTHER): Payer: Medicare Other | Admitting: Internal Medicine

## 2016-05-21 VITALS — BP 122/68 | HR 80 | Ht 64.0 in | Wt 129.2 lb

## 2016-05-21 DIAGNOSIS — J3089 Other allergic rhinitis: Secondary | ICD-10-CM | POA: Diagnosis not present

## 2016-05-21 DIAGNOSIS — J45901 Unspecified asthma with (acute) exacerbation: Secondary | ICD-10-CM

## 2016-05-21 DIAGNOSIS — J4541 Moderate persistent asthma with (acute) exacerbation: Secondary | ICD-10-CM | POA: Diagnosis not present

## 2016-05-21 DIAGNOSIS — J302 Other seasonal allergic rhinitis: Secondary | ICD-10-CM | POA: Diagnosis not present

## 2016-05-21 LAB — NITRIC OXIDE: Nitric Oxide: 48

## 2016-05-21 MED ORDER — LEVALBUTEROL HCL 0.63 MG/3ML IN NEBU
0.6300 mg | INHALATION_SOLUTION | Freq: Once | RESPIRATORY_TRACT | Status: AC
  Administered 2016-05-21: 0.63 mg via RESPIRATORY_TRACT

## 2016-05-21 MED ORDER — METHYLPREDNISOLONE ACETATE 80 MG/ML IJ SUSP
80.0000 mg | Freq: Once | INTRAMUSCULAR | Status: AC
  Administered 2016-05-21: 80 mg via INTRAMUSCULAR

## 2016-05-21 MED ORDER — GLYCOPYRROLATE-FORMOTEROL 9-4.8 MCG/ACT IN AERO: 2.0000 | INHALATION_SPRAY | Freq: Two times a day (BID) | RESPIRATORY_TRACT | 0 refills | Status: DC

## 2016-05-21 MED ORDER — BENZONATATE 200 MG PO CAPS: 200.0000 mg | ORAL_CAPSULE | Freq: Three times a day (TID) | ORAL | 1 refills | Status: DC | PRN

## 2016-05-21 NOTE — Progress Notes (Signed)
F never smoker  asthma; constant cough per patient. PFT 10/06/2007- WNL. FVC 2.66/ 88%, FEV1 2.14/ 98%, FEV1/FVC 0.8, TLC 105%, DLCO 98% Allergy Profile 08/01/13- Total IgE 151.4, Broad elevations for many common environmental allergens    11/08/2015-75 year old female never smoker followed for asthma, chronic cough, allergic rhinitis ACUTE VISIT: Pt continues to have congestion, cough, and out of breath since later part of March-has been given abx's and breathing treatments. Just can not shake this. ER visit 11/03/2015-nasal congestion and fever. Z-Pak had caused diarrhea. She was treated with nebulizer, prescriptions for albuterol inhaler, prednisone, amoxicillin and told to follow-up with PCP. Onset 1 month ago of an acute illness, initially febrile with cough and malaise, discolored sputum. Nasal congestion and chest congestion. Copious mucus. Neti pot helped. We sent amoxicillin. PCP then gave neb treatment and Z-Pak. She blamed a Z-Pak for loose stools. ER visit as above. 2 days left each of prednisone and amoxicillin. Mucus color has cleared but remains productive head and chest. Declines cough medicine prescription. CXR 11/03/2015-images reviewed with her and her husband IMPRESSION: 1. Mild diffuse peribronchial cuffing, suggestive of an acute bronchitis. 2. Atherosclerosis. Electronically Signed  By: Vinnie Langton M.D.  On: 11/03/2015 09:12  11/15/2015-75 year old female never smoker followed for asthma, chronic cough, allergic rhinitis ACUTE VISIT:Pt has completed abx and prednisone-still having congestion, SOB, wheeze, Left ear pain has worsened.  We gave nebulizer treatment and Depo-Medrol  last visit. She had finished amoxicillin and prednisone taper. Still coughing but sputum is now white. Left ear hurt yesterday, better today, hearing okay.  05/21/2016-75 year old female never smoker followed for asthma, chronic cough, allergic rhinitis Pt states breathing is rough. Pt  states no tightness in chest, Pt has congestion in chest, Pt SOB w/ cough. Coughing up a mild color mucus. Dulera 200, Ventolin HFA, Nasacort Has had more cough since summer. Dr. Olga Coaster and made recommendations addressing GERD component of cough which seemed to help some. Productive/white. Some wheezing. She recognizes a mild viral syndrome URI in the last 2 or 3 days which is separate, treated with Mucinex. PFT - WNL 10/06/2007 FENO 05/21/16- 48= elevation consistent with allergic component to asthma CXR 11/03/2015 IMPRESSION: 1. Mild diffuse peribronchial cuffing, suggestive of an acute bronchitis. 2. Atherosclerosis.  ROS-see HPI Constitutional:   No-   weight loss, night sweats, fevers, chills, fatigue, lassitude. HEENT:   No-  headaches, difficulty swallowing, tooth/dental problems, +sore throat,       No-  sneezing, itching, ear ache, +nasal congestion, +post nasal drip,  CV:  No-   chest pain, orthopnea, PND, swelling in lower extremities, anasarca,                                                         dizziness, palpitations Resp: +  shortness of breath with exertion or at rest.             + productive cough,  + non-productive cough,  No- coughing up of blood.              No-   change in color of mucus.  No- wheezing.   Skin: No-   rash or lesions. GI:  No- heartburn, indigestion, abdominal pain, nausea, vomiting,  GU:  MS:  No-   joint pain or swelling.   Neuro-  nothing unusual Psych:  No- change in mood or affect. No depression +anxiety.  No memory loss.  OBJ- Physical Exam General- Alert, Oriented, Affect-appropriate, Distress- none acute, trim, alert Skin- rash-none, lesions- none, excoriation- none Lymphadenopathy- none Head- atraumatic            Eyes- Gross vision intact, PERRLA, conjunctivae and secretions clear            Ears- Hearing, canals-normal            Nose- Clear, no-Septal dev, mucus, polyps, erosion, perforation             Throat- Mallampati  II , mucosa clear , drainage- none, tonsils- atrophic Neck- flexible , trachea midline, no stridor , thyroid nl, carotid no bruit Chest - symmetrical excursion , unlabored           Heart/CV- RRR , no murmur , no gallop  , no rub, nl s1 s2                           - JVD- none , edema- none, stasis changes- none, varices- none           Lung- + minimal wheeze , + active cough- raspy upper airway, dullness-none, rub- none           Chest wall-  Abd-  Br/ Gen/ Rectal- Not done, not indicated Extrem- cyanosis- none, clubbing, none, atrophy- none, strength- nl Neuro- grossly intact to observation

## 2016-05-21 NOTE — Patient Instructions (Signed)
Script sent for bezonatate perles to help with cough  Ok to continue mucinex, and to use otc cough symptom remedies if helpful  Sample x 2 Bevespi inhaler   Inhale 2 puffs, twice daily. Try this for awhile instead of Dulera for comparison.  Order FENO      Dx asthma exacerbation  Depo 80  Neb xop 0.63  Please call if we can help

## 2016-05-21 NOTE — Assessment & Plan Note (Signed)
CT of sinuses was negative years ago. Not describing acute sinusitis and nasal symptoms are incidental compared to her asthma/bronchitis.

## 2016-05-21 NOTE — Assessment & Plan Note (Signed)
Moderate persistent pattern since summer now with incidental URI causing some acute exacerbation. Apparently antireflux issues may have helped baseline cough some. Elevated FENO suggests an allergic component although we are past fall pollen season now. Ruthe Mannan is not controlling well enough. Plan-try replacing Dulera with samples of Bevespi for comparison. Depomedrol and neb xopenex today. Tessalon perles.

## 2016-05-25 ENCOUNTER — Telehealth: Payer: Self-pay | Admitting: Internal Medicine

## 2016-05-25 NOTE — Telephone Encounter (Signed)
Called and spoke to pt. Pt c/o sore in mouth x 2 days. Pt is questioning thrush, pt denies white patches in mouth. Pt states she started Bevespi on 11.2.17. Pt is requesting recs.   Dr. Annamaria Boots please advise. Thanks.   Allergies  Allergen Reactions  . Tetanus Toxoid     Eyelid edema FROM HORSES; therefore , she was told she could not take Tetanus shot.She has had tetanus shot w/o adverse effect since 2002    Current Outpatient Prescriptions on File Prior to Visit  Medication Sig Dispense Refill  . Ascorbic Acid (VITAMIN C) 1000 MG tablet Take 1,000 mg by mouth daily.    . benzonatate (TESSALON) 200 MG capsule Take 1 capsule (200 mg total) by mouth 3 (three) times daily as needed for cough. 30 capsule 1  . DULERA 200-5 MCG/ACT AERO 2 PUFFS TWICE DAILY THEN RINSE MOUTH- MAINTENANCE 13 g 5  . ECHINACEA PO Take by mouth.    . escitalopram (LEXAPRO) 10 MG tablet Take 0.5-1 tablets (5-10 mg total) by mouth daily. 90 tablet 1  . Glycopyrrolate-Formoterol (BEVESPI AEROSPHERE) 9-4.8 MCG/ACT AERO Inhale 2 puffs into the lungs 2 (two) times daily. 2 Inhaler 0  . hydrochlorothiazide (MICROZIDE) 12.5 MG capsule Take 1 capsule (12.5 mg total) by mouth daily. 90 capsule 3  . L-Lysine 500 MG TABS Take by mouth as needed.      . montelukast (SINGULAIR) 10 MG tablet Take 1 tablet (10 mg total) by mouth at bedtime. 30 tablet 11  . predniSONE (DELTASONE) 10 MG tablet Take 1 tablet (10 mg total) by mouth daily with breakfast. 7 tablet 0  . predniSONE (DELTASONE) 10 MG tablet 4 X 2 DAYS, 3 X 2 DAYS, 2 X 2 DAYS, 1 X 2 DAYS (Patient not taking: Reported on 05/21/2016) 20 tablet 0  . Sodium Chloride-Sodium Bicarb (NETI POT SINUS [place] NA) Place into the nose.    . triamcinolone (NASACORT) 55 MCG/ACT AERO nasal inhaler Place 2 sprays into the nose daily. Reported on 11/15/2015    . VENTOLIN HFA 108 (90 Base) MCG/ACT inhaler INHALE 1 OR 2 PUFFS INTO THE LUNGS EVERY6 HOURS AS NEEDED FOR WHEEZING OR SHORTNESS OF BREATH 18 g  2  . zolpidem (AMBIEN) 5 MG tablet Take 1 tablet (5 mg total) by mouth at bedtime as needed for sleep. (Patient not taking: Reported on 05/21/2016) 30 tablet 0   No current facility-administered medications on file prior to visit.

## 2016-05-25 NOTE — Telephone Encounter (Signed)
Pt aware of recs from [facility]. Nothing more needed at this time.

## 2016-05-25 NOTE — Telephone Encounter (Signed)
Bevespi does not have a cortisone and wouldn't cause thrush. If it seems to help breathing otherwise, then suggest leaving on bathroom sink and rinsing mouth well after use. See if that helps.

## 2016-05-26 DIAGNOSIS — H52223 Regular astigmatism, bilateral: Secondary | ICD-10-CM | POA: Diagnosis not present

## 2016-06-15 DIAGNOSIS — M65871 Other synovitis and tenosynovitis, right ankle and foot: Secondary | ICD-10-CM | POA: Diagnosis not present

## 2016-06-19 ENCOUNTER — Ambulatory Visit: Payer: Medicare Other | Admitting: Emergency Medicine

## 2016-06-30 DIAGNOSIS — Z1231 Encounter for screening mammogram for malignant neoplasm of breast: Secondary | ICD-10-CM | POA: Diagnosis not present

## 2016-06-30 DIAGNOSIS — J019 Acute sinusitis, unspecified: Secondary | ICD-10-CM | POA: Diagnosis not present

## 2016-06-30 DIAGNOSIS — Z1389 Encounter for screening for other disorder: Secondary | ICD-10-CM | POA: Diagnosis not present

## 2016-06-30 DIAGNOSIS — Z124 Encounter for screening for malignant neoplasm of cervix: Secondary | ICD-10-CM | POA: Diagnosis not present

## 2016-06-30 DIAGNOSIS — Z6822 Body mass index (BMI) 22.0-22.9, adult: Secondary | ICD-10-CM | POA: Diagnosis not present

## 2016-06-30 DIAGNOSIS — Z01419 Encounter for gynecological examination (general) (routine) without abnormal findings: Secondary | ICD-10-CM | POA: Diagnosis not present

## 2016-06-30 LAB — HM PAP SMEAR: HM Pap smear: NEGATIVE

## 2016-06-30 LAB — HM MAMMOGRAPHY: HM Mammogram: NORMAL (ref 0–4)

## 2016-07-02 ENCOUNTER — Encounter: Payer: Self-pay | Admitting: Internal Medicine

## 2016-07-09 ENCOUNTER — Encounter: Payer: Self-pay | Admitting: Internal Medicine

## 2016-07-23 ENCOUNTER — Encounter: Payer: Self-pay | Admitting: Internal Medicine

## 2016-07-23 ENCOUNTER — Ambulatory Visit (INDEPENDENT_AMBULATORY_CARE_PROVIDER_SITE_OTHER): Payer: Medicare Other | Admitting: Internal Medicine

## 2016-07-23 VITALS — BP 112/70 | HR 56 | Ht 64.0 in | Wt 130.0 lb

## 2016-07-23 DIAGNOSIS — J069 Acute upper respiratory infection, unspecified: Secondary | ICD-10-CM

## 2016-07-23 DIAGNOSIS — J0100 Acute maxillary sinusitis, unspecified: Secondary | ICD-10-CM | POA: Diagnosis not present

## 2016-07-23 DIAGNOSIS — J4520 Mild intermittent asthma, uncomplicated: Secondary | ICD-10-CM

## 2016-07-23 MED ORDER — METHYLPREDNISOLONE ACETATE 80 MG/ML IJ SUSP
80.0000 mg | Freq: Once | INTRAMUSCULAR | Status: AC
  Administered 2016-07-23: 80 mg via INTRAMUSCULAR

## 2016-07-23 MED ORDER — AZITHROMYCIN 250 MG PO TABS: ORAL_TABLET | ORAL | 0 refills | Status: DC

## 2016-07-23 MED ORDER — LEVALBUTEROL HCL 0.63 MG/3ML IN NEBU
0.6300 mg | INHALATION_SOLUTION | Freq: Once | RESPIRATORY_TRACT | Status: AC
  Administered 2016-07-23: 0.63 mg via RESPIRATORY_TRACT

## 2016-07-23 NOTE — Patient Instructions (Signed)
Script printed for Zpak antibiotic to hold in case needed later  Neb xop 0.63     Dx acute maxillary sinusitis  Depo 80  OTC cold and flu remedies are ok if needed  Please call if we can help

## 2016-07-23 NOTE — Progress Notes (Signed)
F never smoker  asthma; constant cough per patient. PFT 10/06/2007- WNL. FVC 2.66/ 88%, FEV1 2.14/ 98%, FEV1/FVC 0.8, TLC 105%, DLCO 98% Allergy Profile 08/01/13- Total IgE 151.4, Broad elevations for many common environmental allergens FENO 05/21/16- 48= elevation consistent with allergic component to asthma ---------------------------------------------  05/21/2016-76 year old female never smoker followed for asthma, chronic cough, allergic rhinitis Pt states breathing is rough. Pt states no tightness in chest, Pt has congestion in chest, Pt SOB w/ cough. Coughing up a mild color mucus. Dulera 200, Ventolin HFA, Nasacort Has had more cough since summer. Dr. Olga Coaster and made recommendations addressing GERD component of cough which seemed to help some. Productive/white. Some wheezing. She recognizes a mild viral syndrome URI in the last 2 or 3 days which is separate, treated with Mucinex. PFT - WNL 10/06/2007 FENO 05/21/16- 48= elevation consistent with allergic component to asthma CXR 11/03/2015 IMPRESSION: 1. Mild diffuse peribronchial cuffing, suggestive of an acute bronchitis. 2. Atherosclerosis.  07/23/2016-76 year old female never smoker followed for asthma, chronic cough, allergic rhinitis complicated by GERD FOLLOWS FOR: SINUSES ARE stopped up,back of eyes hurt, thinks she'e got a cold Acute illness-head cold-ears stopped up, nasal congestion, retro-orbital pressure, nasal stuffiness and some itching. Sore throat. Denies fever or purulent discharge so far. Denies chest tightness, cough or wheeze so far.  ROS-see HPI Constitutional:   No-   weight loss, night sweats, fevers, chills, fatigue, lassitude. HEENT:   No-  headaches, difficulty swallowing, tooth/dental problems, +sore throat,       No-  sneezing, itching, ear ache, +nasal congestion, +post nasal drip,  CV:  No-   chest pain, orthopnea, PND, swelling in lower extremities, anasarca,                                              dizziness, palpitations Resp:   shortness of breath with exertion or at rest.              productive cough,   non-productive cough,  No- coughing up of blood.              No-   change in color of mucus.  No- wheezing.   Skin: No-   rash or lesions. GI:  No- heartburn, indigestion, abdominal pain, nausea, vomiting,  GU:  MS:  No-   joint pain or swelling.   Neuro-     nothing unusual Psych:  No- change in mood or affect. No depression +anxiety.  No memory loss.  OBJ- Physical Exam General- Alert, Oriented, Affect-appropriate, Distress- none acute, trim, alert Skin- rash-none, lesions- none, excoriation- none Lymphadenopathy- none Head- atraumatic            Eyes- Gross vision intact, PERRLA, conjunctivae and secretions clear            Ears- Hearing, canals-normal, + L TM retracted            Nose- + turbinate edema, no-Septal dev, mucus, polyps, erosion, perforation             Throat- Mallampati II , mucosa clear , drainage- none, tonsils- atrophic Neck- flexible , trachea midline, no stridor , thyroid nl, carotid no bruit Chest - symmetrical excursion , unlabored           Heart/CV- RRR , no murmur , no gallop  , no rub, nl s1 s2                           -  JVD- none , edema- none, stasis changes- none, varices- none           Lung-  wheeze-none , cough-none, dullness-none, rub- none           Chest wall-  Abd-  Br/ Gen/ Rectal- Not done, not indicated Extrem- cyanosis- none, clubbing, none, atrophy- none, strength- nl Neuro- grossly intact to observation

## 2016-07-24 DIAGNOSIS — J4520 Mild intermittent asthma, uncomplicated: Secondary | ICD-10-CM | POA: Insufficient documentation

## 2016-07-24 NOTE — Assessment & Plan Note (Addendum)
So far with this acute illness her lower respiratory tract is not involved. Progression is likely. Plan-Depo-Medrol. Discussed her home medications. Call if needed.

## 2016-07-24 NOTE — Assessment & Plan Note (Addendum)
Acute infection, likely viral at this point, with rhinitis and eustachian dysfunction. Retro-orbital symptoms suggest impending acute maxillary sinusitis. We agreed to make antibiotic available in case she progresses that way. Plan-supportive care discussed. Z-Pak given to hold because of cold weather and upcoming weekend. Nasal nebulizer decongestant, Depo-Medrol.

## 2016-09-04 ENCOUNTER — Other Ambulatory Visit: Payer: Self-pay | Admitting: Internal Medicine

## 2016-09-08 ENCOUNTER — Encounter: Payer: Self-pay | Admitting: Internal Medicine

## 2016-09-08 ENCOUNTER — Ambulatory Visit (INDEPENDENT_AMBULATORY_CARE_PROVIDER_SITE_OTHER): Payer: Medicare Other | Admitting: Internal Medicine

## 2016-09-08 DIAGNOSIS — J302 Other seasonal allergic rhinitis: Secondary | ICD-10-CM

## 2016-09-08 DIAGNOSIS — J3089 Other allergic rhinitis: Secondary | ICD-10-CM | POA: Diagnosis not present

## 2016-09-08 NOTE — Patient Instructions (Signed)
You can start doing zyrtec and nasacort about 2-3 days before flying.  Get some affrin to take with you while traveling and use about 20 minutes before you flu to help keep the pressure down.

## 2016-09-08 NOTE — Progress Notes (Signed)
   Subjective:    Patient ID: Susan Chavez, female    DOB: 02-13-1941, 76 y.o.   MRN: [id number]  HPI The patient is a 76 YO female coming in for sinus pressure. Some congestion as well in her sinuses. No ear pain or hearing changes. Denies cough or SOB. Denies nose dripping or drainage. She is having some mild pressure which she describes as a head pressure behind her ears. No muscle aches or migraines. No fevers or chills. Going on for 1 week or so. Has not tried anything for it. Previously with severe allergies and did injections and so rarely has problems now.   Review of Systems  Constitutional: Negative.   HENT: Positive for congestion and sinus pressure. Negative for ear discharge, ear pain, postnasal drip, rhinorrhea, sinus pain, sore throat, trouble swallowing and voice change.   Eyes: Negative.   Respiratory: Negative.   Cardiovascular: Negative.   Gastrointestinal: Negative.   Musculoskeletal: Negative.   Neurological: Negative.       Objective:   Physical Exam  Constitutional: She is oriented to person, place, and time. She appears well-developed and well-nourished.  HENT:  Head: Normocephalic and atraumatic.  Right Ear: External ear normal.  Left Ear: External ear normal.  Nose with swelling of the turbinates, oropharynx with mild clear drainage.   Eyes: EOM are normal. Pupils are equal, round, and reactive to light.  Neck: Normal range of motion. No JVD present.  Cardiovascular: Normal rate and regular rhythm.   Pulmonary/Chest: Effort normal and breath sounds normal.  Abdominal: Soft.  Lymphadenopathy:    She has no cervical adenopathy.  Neurological: She is alert and oriented to person, place, and time.  Skin: Skin is warm and dry.   Vitals:   09/08/16 1434  BP: (!) 150/70  Pulse: (!) 58  Temp: 98.1 F (36.7 C)  TempSrc: Oral  SpO2: 100%  Weight: 129 lb (58.5 kg)  Height: 5\' 4"  (1.626 m)      Assessment & Plan:

## 2016-09-08 NOTE — Progress Notes (Signed)
Pre visit review using our clinic review tool, if applicable. No additional management support is needed unless otherwise documented below in the visit note. 

## 2016-09-09 NOTE — Assessment & Plan Note (Signed)
Advised to start taking zyrtec and nasacort and use affrin with flying this weekend to help avoid pressure pain while ascending and descending.

## 2016-10-26 ENCOUNTER — Other Ambulatory Visit: Payer: Self-pay | Admitting: Internal Medicine

## 2016-10-28 DIAGNOSIS — L918 Other hypertrophic disorders of the skin: Secondary | ICD-10-CM | POA: Diagnosis not present

## 2016-10-28 DIAGNOSIS — L820 Inflamed seborrheic keratosis: Secondary | ICD-10-CM | POA: Diagnosis not present

## 2016-10-28 DIAGNOSIS — B370 Candidal stomatitis: Secondary | ICD-10-CM | POA: Diagnosis not present

## 2016-11-16 ENCOUNTER — Encounter: Payer: Self-pay | Admitting: Internal Medicine

## 2016-11-16 ENCOUNTER — Ambulatory Visit (INDEPENDENT_AMBULATORY_CARE_PROVIDER_SITE_OTHER): Payer: Medicare Other | Admitting: Internal Medicine

## 2016-11-16 VITALS — BP 156/84 | HR 66 | Temp 98.2°F | Resp 16 | Wt 133.0 lb

## 2016-11-16 DIAGNOSIS — I10 Essential (primary) hypertension: Secondary | ICD-10-CM | POA: Diagnosis not present

## 2016-11-16 DIAGNOSIS — J4520 Mild intermittent asthma, uncomplicated: Secondary | ICD-10-CM

## 2016-11-16 MED ORDER — AMOXICILLIN-POT CLAVULANATE 875-125 MG PO TABS: 1.0000 | ORAL_TABLET | Freq: Two times a day (BID) | ORAL | 0 refills | Status: DC

## 2016-11-16 NOTE — Patient Instructions (Signed)

## 2016-11-16 NOTE — Progress Notes (Signed)
Pre visit review using our clinic review tool, if applicable. No additional management support is needed unless otherwise documented below in the visit note. 

## 2016-11-16 NOTE — Assessment & Plan Note (Signed)
Mild asthma flare, lungs are clear on exam - steroids not needed at this time, but she will let me know if her symptoms worse Continue dulera, ventolin as needed Start augmentin coricidan cold products/ otc meds prn Call if no improvement   Due for pneumovax - she will get that in the next few weeks when feeling better

## 2016-11-16 NOTE — Assessment & Plan Note (Signed)
BP very elevated today - did decrease second time it was checked ? Need medication again - currently not on anything monitor at home - she most likely needs medication again, but she is sick and that may be influencing her BP

## 2016-11-16 NOTE — Progress Notes (Signed)
Subjective:    Patient ID: Susan Chavez, female    DOB: June 11, 1941, 76 y.o.   MRN: [id number]  HPI She is here for an acute visit for cold symptoms.   Her symptoms started a few days ago and became worse yesterday.  She has asthma and often gets bronchitis.   She is experiencing eye pain, pnd, sinus pressure,  hoarseness, roof of mouth sore, cough that is productive, chest tightness, wheeze and fatigue.   She denies fever and chills.  She has not had SOB, headaches or GI symptoms.  She has tried taking the nasacort and dulera with some improvement in symptoms.  BP elevated - has history of htn.  She is not currently taking the hctz - did not think she needed it.  She does not monitor her BP at home.  She denies chest pain, palps and headaches.   Medications and allergies reviewed with patient and updated if appropriate.  Patient Active Problem List   Diagnosis Date Noted  . Asthmatic bronchitis, mild intermittent, uncomplicated 27/74/1287  . Sleep difficulties 11/20/2015  . Asthmatic bronchitis with acute exacerbation 05/29/2015  . Osteopenia 01/18/2015  . Essential hypertension 10/24/2014  . S/P repair of patent ductus arteriosus 12/11/2010  . BENIGN POSITIONAL VERTIGO 06/09/2010  . OVARIAN CYST 06/09/2010  . History of colonic polyps 09/14/2008  . Seasonal and perennial allergic rhinitis 07/25/2007  . Hyperlipidemia 10/14/2006    Current Outpatient Prescriptions on File Prior to Visit  Medication Sig Dispense Refill  . Ascorbic Acid (VITAMIN C) 1000 MG tablet Take 1,000 mg by mouth daily.    . DULERA 200-5 MCG/ACT AERO USE 2 PUFFS TWICE DAILY THEN RINSE MOUTH- MAINTENANCE 13 g 5  . ECHINACEA PO Take by mouth.    . escitalopram (LEXAPRO) 10 MG tablet Take 1/2-1 tablet every day -- Yearly visit needed for further refills. 90 tablet 0  . hydrochlorothiazide (MICROZIDE) 12.5 MG capsule Take 1 capsule (12.5 mg total) by mouth daily. 90 capsule 3  . L-Lysine 500 MG TABS Take  by mouth as needed.      . Sodium Chloride-Sodium Bicarb (NETI POT SINUS WASH NA) Place into the nose.    . triamcinolone (NASACORT) 55 MCG/ACT AERO nasal inhaler Place 2 sprays into the nose daily. Reported on 11/15/2015    . VENTOLIN HFA 108 (90 Base) MCG/ACT inhaler INHALE 1 OR 2 PUFFS INTO THE LUNGS EVERY6 HOURS AS NEEDED FOR WHEEZING OR SHORTNESS OF BREATH 18 g 2   No current facility-administered medications on file prior to visit.     Past Medical History:  Diagnosis Date  . Allergic rhinitis    former patient at [facility] Chest with allergy injections  . Asthma   . Colon polyp 2003  . Hyperlipidemia   . Polio   . seizure 1982   had a seizure x 1 76 yo, only one episode, ? really a seizure    Past Surgical History:  Procedure Laterality Date  . BREAST SURGERY     possible tumor-benign  . COLONOSCOPY    . PATENT DUCTUS ARTERIOUS REPAIR     AGE 49  . TONSILLECTOMY      Social History   Social History  . Marital status: Married    Spouse name: N/A  . Number of children: 2  . Years of education: N/A   Occupational History  . teacher-small business owner-retired    Social History Main Topics  . Smoking status: Never Smoker  . Smokeless tobacco:  Never Used  . Alcohol use Yes     Comment: 7 drinks per week  . Drug use: No  . Sexual activity: Not Asked   Other Topics Concern  . None   Social History Narrative   Lives with spouse; has grand children    Family History  Problem Relation Age of Onset  . Depression Mother   . Colon cancer Mother   . Depression Sister   . Alcohol abuse Father   . Breast cancer Paternal Grandmother   . Asthma Son   . Allergic rhinitis Son   . Pancreatic cancer Sister     2010  . Other Sister     polio  . Pancreatic cancer Sister     Review of Systems  Constitutional: Positive for fatigue. Negative for chills and fever.  HENT: Positive for congestion, ear pain, postnasal drip, sinus pressure and voice change. Negative for  sore throat (roof of mouth is sore).   Respiratory: Positive for cough (productive), chest tightness and wheezing. Negative for shortness of breath.   Gastrointestinal: Negative for diarrhea and nausea.  Neurological: Negative for dizziness, light-headedness and headaches.       Objective:   Vitals:   11/16/16 1046 11/16/16 1132  BP: (!) 172/94 (!) 156/84  Pulse: 66   Resp: 16   Temp: 98.2 F (36.8 C)    Filed Weights   11/16/16 1046  Weight: 133 lb (60.3 kg)   Body mass index is 22.83 kg/m.  Wt Readings from Last 3 Encounters:  11/16/16 133 lb (60.3 kg)  09/08/16 129 lb (58.5 kg)  07/23/16 130 lb (59 kg)     Physical Exam GENERAL APPEARANCE: Appears stated age, well appearing, NAD EYES: conjunctiva clear, no icterus HEENT: bilateral tympanic membranes and ear canals normal, oropharynx with mild erythema, no thyromegaly, trachea midline, no cervical or supraclavicular lymphadenopathy LUNGS: Clear to auscultation without wheeze or crackles, unlabored breathing, good air entry bilaterally HEART: Normal S1,S2 without murmurs EXTREMITIES: Without clubbing, cyanosis, or edema        Assessment & Plan:   See Problem List for Assessment and Plan of chronic medical problems.

## 2016-12-16 DIAGNOSIS — L723 Sebaceous cyst: Secondary | ICD-10-CM | POA: Diagnosis not present

## 2016-12-16 DIAGNOSIS — L720 Epidermal cyst: Secondary | ICD-10-CM | POA: Diagnosis not present

## 2016-12-16 DIAGNOSIS — L821 Other seborrheic keratosis: Secondary | ICD-10-CM | POA: Diagnosis not present

## 2016-12-16 DIAGNOSIS — L918 Other hypertrophic disorders of the skin: Secondary | ICD-10-CM | POA: Diagnosis not present

## 2016-12-28 DIAGNOSIS — H2513 Age-related nuclear cataract, bilateral: Secondary | ICD-10-CM | POA: Diagnosis not present

## 2017-01-12 ENCOUNTER — Encounter: Payer: Self-pay | Admitting: Internal Medicine

## 2017-01-12 ENCOUNTER — Ambulatory Visit (INDEPENDENT_AMBULATORY_CARE_PROVIDER_SITE_OTHER): Payer: Medicare Other | Admitting: Internal Medicine

## 2017-01-12 ENCOUNTER — Other Ambulatory Visit (INDEPENDENT_AMBULATORY_CARE_PROVIDER_SITE_OTHER): Payer: Medicare Other

## 2017-01-12 VITALS — BP 152/82 | HR 67 | Temp 98.7°F | Resp 16 | Ht 64.0 in | Wt 129.0 lb

## 2017-01-12 DIAGNOSIS — F419 Anxiety disorder, unspecified: Secondary | ICD-10-CM

## 2017-01-12 DIAGNOSIS — J4530 Mild persistent asthma, uncomplicated: Secondary | ICD-10-CM

## 2017-01-12 DIAGNOSIS — I10 Essential (primary) hypertension: Secondary | ICD-10-CM | POA: Diagnosis not present

## 2017-01-12 DIAGNOSIS — E7800 Pure hypercholesterolemia, unspecified: Secondary | ICD-10-CM

## 2017-01-12 DIAGNOSIS — J4540 Moderate persistent asthma, uncomplicated: Secondary | ICD-10-CM | POA: Insufficient documentation

## 2017-01-12 DIAGNOSIS — R7303 Prediabetes: Secondary | ICD-10-CM | POA: Insufficient documentation

## 2017-01-12 LAB — CBC WITH DIFFERENTIAL/PLATELET
Basophils Absolute: 0 10*3/uL (ref 0.0–0.1)
Basophils Relative: 0.5 % (ref 0.0–3.0)
EOS ABS: 0.2 10*3/uL (ref 0.0–0.7)
Eosinophils Relative: 4.3 % (ref 0.0–5.0)
HEMATOCRIT: 40.8 % (ref 36.0–46.0)
HEMOGLOBIN: 13.7 g/dL (ref 12.0–15.0)
LYMPHS ABS: 1.6 10*3/uL (ref 0.7–4.0)
Lymphocytes Relative: 29.8 % (ref 12.0–46.0)
MCHC: 33.6 g/dL (ref 30.0–36.0)
MCV: 94.3 fl (ref 78.0–100.0)
MONOS PCT: 6 % (ref 3.0–12.0)
Monocytes Absolute: 0.3 10*3/uL (ref 0.1–1.0)
Neutro Abs: 3.1 10*3/uL (ref 1.4–7.7)
Neutrophils Relative %: 59.4 % (ref 43.0–77.0)
PLATELETS: 257 10*3/uL (ref 150.0–400.0)
RBC: 4.33 Mil/uL (ref 3.87–5.11)
RDW: 13.2 % (ref 11.5–15.5)
WBC: 5.3 10*3/uL (ref 4.0–10.5)

## 2017-01-12 LAB — COMPREHENSIVE METABOLIC PANEL
ALBUMIN: 4.3 g/dL (ref 3.5–5.2)
ALT: 14 U/L (ref 0–35)
AST: 21 U/L (ref 0–37)
Alkaline Phosphatase: 50 U/L (ref 39–117)
BUN: 13 mg/dL (ref 6–23)
CALCIUM: 9.7 mg/dL (ref 8.4–10.5)
CHLORIDE: 102 meq/L (ref 96–112)
CO2: 30 meq/L (ref 19–32)
CREATININE: 0.86 mg/dL (ref 0.40–1.20)
GFR: 68 mL/min (ref >60.00)
Glucose, Bld: 95 mg/dL (ref 70–99)
POTASSIUM: 3.7 meq/L (ref 3.5–5.1)
Sodium: 140 mEq/L (ref 135–145)
Total Bilirubin: 0.6 mg/dL (ref 0.2–1.2)
Total Protein: 6.6 g/dL (ref 6.0–8.3)

## 2017-01-12 LAB — LIPID PANEL
CHOLESTEROL: 259 mg/dL — AB (ref 0–200)
HDL: 118.1 mg/dL (ref >39.00)
LDL CALC: 126 mg/dL — AB (ref 0–99)
NonHDL: 140.82
TRIGLYCERIDES: 73 mg/dL (ref 0.0–149.0)
Total CHOL/HDL Ratio: 2
VLDL: 14.6 mg/dL (ref 0.0–40.0)

## 2017-01-12 LAB — HEMOGLOBIN A1C: HEMOGLOBIN A1C: 5.7 % (ref 4.6–6.5)

## 2017-01-12 LAB — TSH: TSH: 2.12 u[IU]/mL (ref 0.35–4.50)

## 2017-01-12 NOTE — Assessment & Plan Note (Signed)
Slightly elevated here today - ? White coat htn Overall BP has been controlled at home - she monitors it.  She has occasional elevated BP Continue to monitor at home off medication Continue regular exercise Check labs today

## 2017-01-12 NOTE — Assessment & Plan Note (Signed)
Controlled, stable Continue current dose of medication  

## 2017-01-12 NOTE — Progress Notes (Signed)
Subjective:    Patient ID: Susan Chavez, female    DOB: 27-Jan-1941, 76 y.o.   MRN: [id number]  HPI The patient is here for follow up.  Hypertension: She is taking her medication daily. She is compliant with a low sodium diet.  She denies chest pain, palpitations, edema, shortness of breath and regular headaches. She is exercising regularly.  She does monitor her blood pressure at home - well controlled with occasional highs.   112/72 - 152/85.    Anxiety: She is taking her medication daily as prescribed. She denies any side effects from the medication. She feels her anxiety is well controlled and she is happy with her current dose of medication.   Asthma: she uses the dulera twice daily.  She uses the nasal spray and albuterol as needed.  She has not active SOB, cough or wheeze.    Prediabetes:  She is compliant with a low sugar/carbohydrate diet.  She is exercising regularly.    Medications and allergies reviewed with patient and updated if appropriate.  Patient Active Problem List   Diagnosis Date Noted  . Anxiety 01/12/2017  . Asthma 01/12/2017  . Prediabetes 01/12/2017  . Sleep difficulties 11/20/2015  . Asthmatic bronchitis with acute exacerbation 05/29/2015  . Osteopenia 01/18/2015  . Essential hypertension 10/24/2014  . S/P repair of patent ductus arteriosus 12/11/2010  . BENIGN POSITIONAL VERTIGO 06/09/2010  . OVARIAN CYST 06/09/2010  . History of colonic polyps 09/14/2008  . Seasonal and perennial allergic rhinitis 07/25/2007  . Hyperlipidemia 10/14/2006    Current Outpatient Prescriptions on File Prior to Visit  Medication Sig Dispense Refill  . Ascorbic Acid (VITAMIN C) 1000 MG tablet Take 1,000 mg by mouth daily.    . DULERA 200-5 MCG/ACT AERO USE 2 PUFFS TWICE DAILY THEN RINSE MOUTH- MAINTENANCE 13 g 5  . escitalopram (LEXAPRO) 10 MG tablet Take 1/2-1 tablet every day -- Yearly visit needed for further refills. 90 tablet 0  . L-Lysine 500 MG TABS Take by  mouth as needed.      . Sodium Chloride-Sodium Bicarb (NETI POT SINUS WASH NA) Place into the nose.    . triamcinolone (NASACORT) 55 MCG/ACT AERO nasal inhaler Place 2 sprays into the nose daily. Reported on 11/15/2015    . VENTOLIN HFA 108 (90 Base) MCG/ACT inhaler INHALE 1 OR 2 PUFFS INTO THE LUNGS EVERY6 HOURS AS NEEDED FOR WHEEZING OR SHORTNESS OF BREATH 18 g 2  . hydrochlorothiazide (MICROZIDE) 12.5 MG capsule Take 1 capsule (12.5 mg total) by mouth daily. (Patient not taking: Reported on 01/12/2017) 90 capsule 3   No current facility-administered medications on file prior to visit.     Past Medical History:  Diagnosis Date  . Allergic rhinitis    former patient at [facility] Chest with allergy injections  . Asthma   . Colon polyp 2003  . Hyperlipidemia   . Polio   . seizure 1982   had a seizure x 1 76 yo, only one episode, ? really a seizure    Past Surgical History:  Procedure Laterality Date  . BREAST SURGERY     possible tumor-benign  . COLONOSCOPY    . PATENT DUCTUS ARTERIOUS REPAIR     AGE 75  . TONSILLECTOMY      Social History   Social History  . Marital status: Married    Spouse name: N/A  . Number of children: 2  . Years of education: N/A   Occupational History  . teacher-small business  owner-retired    Social History Main Topics  . Smoking status: Never Smoker  . Smokeless tobacco: Never Used  . Alcohol use Yes     Comment: 7 drinks per week  . Drug use: No  . Sexual activity: Not on file   Other Topics Concern  . Not on file   Social History Narrative   Lives with spouse; has grand children    Family History  Problem Relation Age of Onset  . Depression Mother   . Colon cancer Mother   . Depression Sister   . Alcohol abuse Father   . Breast cancer Paternal Grandmother   . Asthma Son   . Allergic rhinitis Son   . Pancreatic cancer Sister        2010  . Other Sister        polio  . Pancreatic cancer Sister     Review of Systems    Constitutional: Negative for chills and fever.  Respiratory: Negative for cough, shortness of breath and wheezing.   Cardiovascular: Negative for chest pain, palpitations and leg swelling.  Gastrointestinal: Negative for abdominal pain and nausea.  Neurological: Negative for dizziness, weakness, light-headedness, numbness and headaches.  Psychiatric/Behavioral: Negative for dysphoric mood. The patient is not nervous/anxious.        Objective:   Vitals:   01/12/17 0833  BP: (!) 152/82  Pulse: 67  Resp: 16  Temp: 98.7 F (37.1 C)   Wt Readings from Last 3 Encounters:  01/12/17 129 lb (58.5 kg)  11/16/16 133 lb (60.3 kg)  09/08/16 129 lb (58.5 kg)   Body mass index is 22.14 kg/m.   Physical Exam    Constitutional: She appears well-developed and well-nourished. No distress.  HENT:  Head: Normocephalic and atraumatic.  Right Ear: External ear normal. Normal ear canal and TM Left Ear: External ear normal.  Normal ear canal and TM Mouth/Throat: Oropharynx is clear and moist.  Eyes: Conjunctivae and EOM are normal.  Neck: Neck supple. No tracheal deviation present. No thyromegaly present.  No carotid bruit  Cardiovascular: Normal rate, regular rhythm and normal heart sounds.   No murmur heard.  No edema. Pulmonary/Chest: Effort normal and breath sounds normal. No respiratory distress. She has no wheezes. She has no rales.  Abdominal: Soft. She exhibits no distension. There is no tenderness.  Lymphadenopathy: She has no cervical adenopathy.  Neuro; normal sensation and strength in all extremities Skin: Skin is warm and dry. She is not diaphoretic.  Psychiatric: She has a normal mood and affect. Her behavior is normal.       Assessment & Plan:    See Problem List for Assessment and Plan of chronic medical problems.

## 2017-01-12 NOTE — Assessment & Plan Note (Signed)
Check a1c Low sugar / carb diet Stressed regular exercise, keeping weight down  

## 2017-01-12 NOTE — Assessment & Plan Note (Signed)
Well controlled Continue dulera twice daily Uses nasal spray and albuterol only as needed Continue above

## 2017-01-12 NOTE — Patient Instructions (Signed)
   Medications reviewed and updated.  No changes recommended at this time.    Please followup in annually

## 2017-01-12 NOTE — Assessment & Plan Note (Addendum)
Not on medication Check lipid panel, tsh - not fasting

## 2017-01-15 DIAGNOSIS — Z0279 Encounter for issue of other medical certificate: Secondary | ICD-10-CM

## 2017-02-03 DIAGNOSIS — M7741 Metatarsalgia, right foot: Secondary | ICD-10-CM | POA: Diagnosis not present

## 2017-02-06 ENCOUNTER — Ambulatory Visit (INDEPENDENT_AMBULATORY_CARE_PROVIDER_SITE_OTHER): Payer: Medicare Other | Admitting: Family Medicine

## 2017-02-06 ENCOUNTER — Encounter: Payer: Self-pay | Admitting: Family Medicine

## 2017-02-06 VITALS — BP 142/86 | HR 64 | Temp 98.2°F | Ht 64.0 in | Wt 129.0 lb

## 2017-02-06 DIAGNOSIS — I10 Essential (primary) hypertension: Secondary | ICD-10-CM

## 2017-02-06 MED ORDER — HYDROCHLOROTHIAZIDE 12.5 MG PO CAPS: 12.5000 mg | ORAL_CAPSULE | Freq: Every day | ORAL | 0 refills | Status: DC

## 2017-02-06 NOTE — Patient Instructions (Signed)
Take your hctz one pill daily  If any problems or side effects alert [place] and stop it Stay hydrated Wear sunscreen  Avoid excessive salty or processed foods Keep exercising   If BP goes below 90s/60s - hold your medicine   See your doctor as planned next month- you will need labs

## 2017-02-06 NOTE — Progress Notes (Signed)
Subjective:    Patient ID: Susan Chavez, female    DOB: 16-Jul-1941, 76 y.o.   MRN: [id number]  HPI  Here with a bp  Issue   Hx of HTN- no medicines now  Used to be on hctz 12.5 from Dr Linna Darner- but not for a while  bp has been high at home with accurate meter    130s/140s over 70-80 for the most part Few in 409W systolic  BP Readings from Last 3 Encounters:  02/06/17 (!) 142/86  01/12/17 (!) 152/82  11/16/16 (!) 156/84   Her bp machine lined up with this   Has had a lot of stress lately - that could affect it   She does exercise with a trainer and walks every day  Eats well also   etoh -occ   No new supplements   No symptoms usually  Felt funny once when bp was 170  (poss anxious) No headache or dizziness  Yesterday she took hctz 12.5 mg and this am -feels better   Patient Active Problem List   Diagnosis Date Noted  . Anxiety 01/12/2017  . Asthma 01/12/2017  . Prediabetes 01/12/2017  . Sleep difficulties 11/20/2015  . Asthmatic bronchitis with acute exacerbation 05/29/2015  . Osteopenia 01/18/2015  . Essential hypertension 10/24/2014  . S/P repair of patent ductus arteriosus 12/11/2010  . BENIGN POSITIONAL VERTIGO 06/09/2010  . OVARIAN CYST 06/09/2010  . History of colonic polyps 09/14/2008  . Seasonal and perennial allergic rhinitis 07/25/2007  . Hyperlipidemia 10/14/2006   Past Medical History:  Diagnosis Date  . Allergic rhinitis    former patient at [facility] with allergy injections  . Asthma   . Colon polyp 2003  . Hyperlipidemia   . Polio   . seizure 1982   had a seizure x 1 76 yo, only one episode, ? really a seizure   Past Surgical History:  Procedure Laterality Date  . BREAST SURGERY     possible tumor-benign  . COLONOSCOPY    . PATENT DUCTUS ARTERIOUS REPAIR     AGE 30  . TONSILLECTOMY     Social History  Substance Use Topics  . Smoking status: Never Smoker  . Smokeless tobacco: Never Used  . Alcohol use Yes     Comment: 7 drinks  per week   Family History  Problem Relation Age of Onset  . Depression Mother   . Colon cancer Mother   . Depression Sister   . Alcohol abuse Father   . Breast cancer Paternal Grandmother   . Asthma Son   . Allergic rhinitis Son   . Pancreatic cancer Sister        2010  . Other Sister        polio  . Pancreatic cancer Sister    Allergies  Allergen Reactions  . Tetanus Toxoid     Eyelid edema FROM HORSES; therefore , she was told she could not take Tetanus shot.She has had tetanus shot w/o adverse effect since 2002   Current Outpatient Prescriptions on File Prior to Visit  Medication Sig Dispense Refill  . Ascorbic Acid (VITAMIN C) 1000 MG tablet Take 1,000 mg by mouth daily.    . DULERA 200-5 MCG/ACT AERO USE 2 PUFFS TWICE DAILY THEN RINSE MOUTH- MAINTENANCE 13 g 5  . escitalopram (LEXAPRO) 10 MG tablet Take 1/2-1 tablet every day -- Yearly visit needed for further refills. 90 tablet 0  . L-Lysine 500 MG TABS Take by mouth as needed.      Marland Kitchen  Sodium Chloride-Sodium Bicarb (NETI POT SINUS [place] NA) Place into the nose.    . triamcinolone (NASACORT) 55 MCG/ACT AERO nasal inhaler Place 2 sprays into the nose daily. Reported on 11/15/2015    . VENTOLIN HFA 108 (90 Base) MCG/ACT inhaler INHALE 1 OR 2 PUFFS INTO THE LUNGS EVERY6 HOURS AS NEEDED FOR WHEEZING OR SHORTNESS OF BREATH 18 g 2   No current facility-administered medications on file prior to visit.      Review of Systems    Review of Systems  Constitutional: Negative for fever, appetite change, fatigue and unexpected weight change. pos for "funny feeling" when bp was high  Eyes: Negative for pain and visual disturbance.  Respiratory: Negative for cough and shortness of breath.   Cardiovascular: Negative for cp or palpitations    Gastrointestinal: Negative for nausea, diarrhea and constipation.  Genitourinary: Negative for urgency and frequency.  Skin: Negative for pallor or rash   Neurological: Negative for weakness,  light-headedness, numbness and headaches.  Hematological: Negative for adenopathy. Does not bruise/bleed easily.  Psychiatric/Behavioral: Negative for dysphoric mood. The patient is sometimes nervous/anxious.  pos for stressors     Objective:   Physical Exam  Constitutional: She appears well-developed and well-nourished. No distress.  Well appearing  HENT:  Head: Normocephalic and atraumatic.  Mouth/Throat: Oropharynx is clear and moist.  Eyes: Pupils are equal, round, and reactive to light. Conjunctivae and EOM are normal.  Neck: Normal range of motion. Neck supple. No JVD present. Carotid bruit is not present. No thyromegaly present.  Cardiovascular: Normal rate, regular rhythm, normal heart sounds and intact distal pulses.  Exam reveals no gallop.   Pulmonary/Chest: Effort normal and breath sounds normal. No respiratory distress. She has no wheezes. She has no rales.  No crackles  Good air exch  Abdominal: Soft. Bowel sounds are normal. She exhibits no distension, no abdominal bruit and no mass. There is no tenderness.  Musculoskeletal: She exhibits no edema.  Lymphadenopathy:    She has no cervical adenopathy.  Neurological: She is alert. She has normal reflexes.  Skin: Skin is warm and dry. No rash noted.  Psychiatric: She has a normal mood and affect.          Assessment & Plan:   Problem List Items Addressed This Visit      Cardiovascular and Mediastinum   Essential hypertension - Primary    bp is trending up  Will start hctz 12.5 mg which she has  Disc poss side eff incl hypotension  F/u with pcp for lab and visit  Disc DASH diet and good habits       Relevant Medications   hydrochlorothiazide (MICROZIDE) 12.5 MG capsule

## 2017-02-07 NOTE — Assessment & Plan Note (Signed)
bp is trending up  Will start hctz 12.5 mg which she has  Disc poss side eff incl hypotension  F/u with pcp for lab and visit  Disc DASH diet and good habits

## 2017-02-17 NOTE — Patient Instructions (Addendum)
  pneumonia immunizations administered today.   Medications reviewed and updated.  Changes include increasing hydrochlorothiazide to 25 mg daily.   Your prescription(s) have been submitted to your pharmacy. Please take as directed and contact our office if you believe you are having problem(s) with the medication(s).

## 2017-02-17 NOTE — Progress Notes (Signed)
Subjective:    Patient ID: Susan Chavez, female    DOB: August 03, 1940, 76 y.o.   MRN: [id number]  HPI She is here for an acute visit.   Hypertension: She is taking her medication daily.  She had not been on medication for a while , but her blood pressure became elevated and has remained elevated on the medication.  She is compliant with a low sodium diet.  She denies chest pain, palpitations, edema, shortness of breath and regular headaches. She is exercising regularly.  She does monitor her blood pressure at home - 123/59-155/78.    Right foot swelling plantar surface near toes.  She denies pain.  Her toes are curling under since the swelling started.  She had the area drained in the past and was told it was a cyst.  She has seen ortho and they advised no surgery since she was not having any pain.  She is still exercising without difficulty.    Medications and allergies reviewed with patient and updated if appropriate.  Patient Active Problem List   Diagnosis Date Noted  . Anxiety 01/12/2017  . Asthma 01/12/2017  . Prediabetes 01/12/2017  . Sleep difficulties 11/20/2015  . Asthmatic bronchitis with acute exacerbation 05/29/2015  . Osteopenia 01/18/2015  . Essential hypertension 10/24/2014  . S/P repair of patent ductus arteriosus 12/11/2010  . BENIGN POSITIONAL VERTIGO 06/09/2010  . OVARIAN CYST 06/09/2010  . History of colonic polyps 09/14/2008  . Seasonal and perennial allergic rhinitis 07/25/2007  . Hyperlipidemia 10/14/2006    Current Outpatient Prescriptions on File Prior to Visit  Medication Sig Dispense Refill  . Ascorbic Acid (VITAMIN C) 1000 MG tablet Take 1,000 mg by mouth daily.    . DULERA 200-5 MCG/ACT AERO USE 2 PUFFS TWICE DAILY THEN RINSE MOUTH- MAINTENANCE 13 g 5  . escitalopram (LEXAPRO) 10 MG tablet Take 1/2-1 tablet every day -- Yearly visit needed for further refills. 90 tablet 0  . hydrochlorothiazide (MICROZIDE) 12.5 MG capsule Take 1 capsule (12.5 mg  total) by mouth daily. 1 capsule 0  . L-Lysine 500 MG TABS Take by mouth as needed.      . Sodium Chloride-Sodium Bicarb (NETI POT SINUS WASH NA) Place into the nose.    . triamcinolone (NASACORT) 55 MCG/ACT AERO nasal inhaler Place 2 sprays into the nose daily. Reported on 11/15/2015    . VENTOLIN HFA 108 (90 Base) MCG/ACT inhaler INHALE 1 OR 2 PUFFS INTO THE LUNGS EVERY6 HOURS AS NEEDED FOR WHEEZING OR SHORTNESS OF BREATH 18 g 2   No current facility-administered medications on file prior to visit.     Past Medical History:  Diagnosis Date  . Allergic rhinitis    former patient at [facility] Chest with allergy injections  . Asthma   . Colon polyp 2003  . Hyperlipidemia   . Polio   . seizure 1982   had a seizure x 1 76 yo, only one episode, ? really a seizure    Past Surgical History:  Procedure Laterality Date  . BREAST SURGERY     possible tumor-benign  . COLONOSCOPY    . PATENT DUCTUS ARTERIOUS REPAIR     AGE 14  . TONSILLECTOMY      Social History   Social History  . Marital status: Married    Spouse name: N/A  . Number of children: 2  . Years of education: N/A   Occupational History  . teacher-small business owner-retired    Social History Main Topics  .  Smoking status: Never Smoker  . Smokeless tobacco: Never Used  . Alcohol use Yes     Comment: 7 drinks per week  . Drug use: No  . Sexual activity: Not on file   Other Topics Concern  . Not on file   Social History Narrative   Lives with spouse; has grand children    Family History  Problem Relation Age of Onset  . Depression Mother   . Colon cancer Mother   . Depression Sister   . Alcohol abuse Father   . Breast cancer Paternal Grandmother   . Asthma Son   . Allergic rhinitis Son   . Pancreatic cancer Sister        2010  . Other Sister        polio  . Pancreatic cancer Sister     Review of Systems  Constitutional: Negative for fever.  Respiratory: Negative for shortness of breath.     Cardiovascular: Negative for chest pain, palpitations and leg swelling.  Neurological: Negative for dizziness, light-headedness and headaches.       Objective:   Vitals:   02/18/17 0842  BP: (!) 156/94  Pulse: 75  Resp: 16  Temp: 98.3 F (36.8 C)   Filed Weights   02/18/17 0842  Weight: 130 lb (59 kg)   Body mass index is 22.31 kg/m.  Wt Readings from Last 3 Encounters:  02/18/17 130 lb (59 kg)  02/06/17 129 lb (58.5 kg)  01/12/17 129 lb (58.5 kg)     Physical Exam Constitutional: Appears well-developed and well-nourished. No distress.  HENT:  Head: Normocephalic and atraumatic.  Neck: Neck supple. No tracheal deviation present. No thyromegaly present.  No cervical lymphadenopathy Cardiovascular: Normal rate, regular rhythm and normal heart sounds.   No murmur heard. No carotid bruit .  No edema Pulmonary/Chest: Effort normal and breath sounds normal. No respiratory distress. No has no wheezes. No rales.  Msk:   Right toes -slight hammer toes, mild swelling fat pad near toes and plantar surface that is nontender -no fluctuance no obvious cyst Skin: Skin is warm and dry. Not diaphoretic.  Psychiatric: Normal mood and affect. Behavior is normal.         Assessment & Plan:   See Problem List for Assessment and Plan of chronic medical problems.

## 2017-02-18 ENCOUNTER — Ambulatory Visit (INDEPENDENT_AMBULATORY_CARE_PROVIDER_SITE_OTHER): Payer: Medicare Other | Admitting: Internal Medicine

## 2017-02-18 ENCOUNTER — Encounter: Payer: Self-pay | Admitting: Internal Medicine

## 2017-02-18 VITALS — BP 156/94 | HR 75 | Temp 98.3°F | Resp 16 | Wt 130.0 lb

## 2017-02-18 DIAGNOSIS — Z23 Encounter for immunization: Secondary | ICD-10-CM | POA: Diagnosis not present

## 2017-02-18 DIAGNOSIS — I10 Essential (primary) hypertension: Secondary | ICD-10-CM

## 2017-02-18 MED ORDER — HYDROCHLOROTHIAZIDE 25 MG PO TABS: 25.0000 mg | ORAL_TABLET | Freq: Every day | ORAL | 3 refills | Status: DC

## 2017-02-18 NOTE — Addendum Note (Signed)
Addended by: Terence Lux B on: 02/18/2017 04:00 PM   Modules accepted: Orders

## 2017-02-18 NOTE — Assessment & Plan Note (Signed)
BP variable at home and often elevated.  Will increase hctz to 25 mg daily She will continue to monitor BP at home and will call if not better controlled or lower bp's too low Will check cmp at next visit

## 2017-04-20 ENCOUNTER — Other Ambulatory Visit: Payer: Self-pay | Admitting: Internal Medicine

## 2017-04-28 DIAGNOSIS — H5203 Hypermetropia, bilateral: Secondary | ICD-10-CM | POA: Diagnosis not present

## 2017-06-03 ENCOUNTER — Ambulatory Visit (INDEPENDENT_AMBULATORY_CARE_PROVIDER_SITE_OTHER): Payer: Medicare Other | Admitting: *Deleted

## 2017-06-03 DIAGNOSIS — Z23 Encounter for immunization: Secondary | ICD-10-CM

## 2017-06-14 ENCOUNTER — Ambulatory Visit: Payer: Medicare Other | Admitting: Family

## 2017-06-14 ENCOUNTER — Encounter: Payer: Self-pay | Admitting: Family Medicine

## 2017-06-14 ENCOUNTER — Ambulatory Visit: Payer: Medicare Other | Admitting: Family Medicine

## 2017-06-14 VITALS — BP 157/73 | HR 66 | Temp 97.8°F | Ht 64.0 in | Wt 132.4 lb

## 2017-06-14 DIAGNOSIS — I10 Essential (primary) hypertension: Secondary | ICD-10-CM | POA: Diagnosis not present

## 2017-06-14 DIAGNOSIS — R059 Cough, unspecified: Secondary | ICD-10-CM

## 2017-06-14 DIAGNOSIS — R05 Cough: Secondary | ICD-10-CM

## 2017-06-14 MED ORDER — AZITHROMYCIN 250 MG PO TABS: ORAL_TABLET | ORAL | 0 refills | Status: DC

## 2017-06-14 MED ORDER — IPRATROPIUM BROMIDE 0.06 % NA SOLN: 2.0000 | Freq: Four times a day (QID) | NASAL | 12 refills | Status: DC

## 2017-06-14 MED ORDER — BENZONATATE 200 MG PO CAPS: 200.0000 mg | ORAL_CAPSULE | Freq: Two times a day (BID) | ORAL | 0 refills | Status: DC | PRN

## 2017-06-14 NOTE — Progress Notes (Signed)
    Subjective:  Susan Chavez is a 76 y.o. female who presents today with a chief complaint of cough.   HPI:  Cough, acute issue Symptoms started 3 days ago.  Worsened over that time.  Associated symptoms include sore throat, rhinorrhea, ear pain, and mild facial pressure.  Sick contacts include her grandchildren and has been.  No fevers or chills.  Tried taking Mucinex without significant relief.  No other obvious alleviating or aggravating factors.  ROS: Per HPI  PMH: Smoking history reviewed.  Never smoker.  Objective:  Physical Exam: BP (!) 157/73   Pulse 66   Temp 97.8 F (36.6 C) (Oral)   Ht 5\' 4"  (1.626 m)   Wt 132 lb 6.4 oz (60.1 kg)   SpO2 96%   BMI 22.73 kg/m   Gen: NAD, resting comfortably HEENT: TMs are clear effusions bilaterally.  Maxillary and frontal sinuses with normal transillumination.  Oropharynx erythematous without exudate.  No lymphadenopathy. CV: RRR with no murmurs appreciated Pulm: NWOB, CTAB with no crackles, wheezes, or rhonchi  Assessment/Plan:  Cough Most consistent with a viral URI.  No signs or symptoms of bacterial infection.  Reassured patient.  Start Atrovent nasal spray.  Start Tessalon.  Patient requested antibiotics- sent in a "pocket prescription" for azithromycin with strict instruction to not start unless symptoms worsen or do not improve in the next 3-5 days.  Encouraged good oral hydration.  Return precautions reviewed.  Follow-up as needed.  Hypertension Elevated today in setting of URI.  Advised home monitoring with goal 140/90 or less and follow-up with PCP.  Algis Greenhouse. Jerline Pain, MD 06/14/2017 11:36 AM

## 2017-06-17 ENCOUNTER — Encounter: Payer: Self-pay | Admitting: Internal Medicine

## 2017-06-17 ENCOUNTER — Ambulatory Visit: Payer: Medicare Other | Admitting: Internal Medicine

## 2017-06-17 VITALS — BP 128/82 | HR 78 | Temp 98.6°F | Resp 16 | Wt 130.0 lb

## 2017-06-17 DIAGNOSIS — F419 Anxiety disorder, unspecified: Secondary | ICD-10-CM | POA: Diagnosis not present

## 2017-06-17 DIAGNOSIS — J4541 Moderate persistent asthma with (acute) exacerbation: Secondary | ICD-10-CM | POA: Diagnosis not present

## 2017-06-17 MED ORDER — ALBUTEROL SULFATE HFA 108 (90 BASE) MCG/ACT IN AERS: 1.0000 | INHALATION_SPRAY | Freq: Four times a day (QID) | RESPIRATORY_TRACT | Status: DC | PRN

## 2017-06-17 MED ORDER — ESCITALOPRAM OXALATE 5 MG PO TABS: 5.0000 mg | ORAL_TABLET | Freq: Every day | ORAL | 3 refills | Status: DC

## 2017-06-17 NOTE — Assessment & Plan Note (Signed)
Controlled, stable Continue current dose of medication - lexapro 5 mg daily

## 2017-06-17 NOTE — Assessment & Plan Note (Signed)
Improving with current treatment Continue zpak, inhaler - use albuterol if needed, otc cold meds prn Rest, fluids Call if no improvement

## 2017-06-17 NOTE — Patient Instructions (Addendum)
Continue your current treatment.  Finish the antibiotic.  Use your inhalers.   Continue increased rest and increased fluids.     Call if no improvement

## 2017-06-17 NOTE — Progress Notes (Signed)
Subjective:    Patient ID: Susan Chavez, female    DOB: 1940/07/31, 76 y.o.   MRN: [id number]  HPI The patient is here for follow up.  She was seen 06/14/17 by Dr. Jerline Pain for cough.  It started 3 days prior and was getting worse.  It started just after thanksgiving and seeing her sick relatives.  She had associated sore throat, runny nose, ear pain, sinus pressure, increased wheeze and SOB.  She is not experiencing any fevers or chills.  It was thought that this was a viral infection.  He advised starting the Atrovent nasal spray, Tessalon Perles.  She did request antibiotics and Dr. Jerline Pain sent in a prescription for a Z-Pak, but did advise not to take this unless symptoms worsened or did not improve.  She is here for follow-up.  She did start the zpak that day and has been using her inhaler, dulera.  She has been resting and is feeling better.  She still has some wheeze and SOB.  She has not used her albuterol.    Anxiety: She is taking her medication daily as prescribed. She denies any side effects from the medication. She feels her anxiety is well controlled and she is happy with her current dose of medication.    Medications and allergies reviewed with patient and updated if appropriate.  Patient Active Problem List   Diagnosis Date Noted  . Anxiety 01/12/2017  . Asthma 01/12/2017  . Prediabetes 01/12/2017  . Sleep difficulties 11/20/2015  . Asthmatic bronchitis with acute exacerbation 05/29/2015  . Osteopenia 01/18/2015  . Essential hypertension 10/24/2014  . S/P repair of patent ductus arteriosus 12/11/2010  . BENIGN POSITIONAL VERTIGO 06/09/2010  . OVARIAN CYST 06/09/2010  . History of colonic polyps 09/14/2008  . Seasonal and perennial allergic rhinitis 07/25/2007  . Hyperlipidemia 10/14/2006    Current Outpatient Medications on File Prior to Visit  Medication Sig Dispense Refill  . Ascorbic Acid (VITAMIN C) 1000 MG tablet Take 1,000 mg by mouth daily.    Marland Kitchen  azithromycin (ZITHROMAX) 250 MG tablet Take 2 tabs day 1, then 1 tab daily 6 each 0  . benzonatate (TESSALON) 200 MG capsule Take 1 capsule (200 mg total) by mouth 2 (two) times daily as needed for cough. 20 capsule 0  . DULERA 200-5 MCG/ACT AERO USE 2 PUFFS TWICE DAILY THEN RINSE MOUTH- MAINTENANCE 13 g 5  . escitalopram (LEXAPRO) 10 MG tablet Take 1/2-1 tablet by mouth daily --- Office visit needed for further refills 90 tablet 0  . hydrochlorothiazide (HYDRODIURIL) 25 MG tablet Take 1 tablet (25 mg total) by mouth daily. 90 tablet 3  . ipratropium (ATROVENT) 0.06 % nasal spray Place 2 sprays into both nostrils 4 (four) times daily. 15 mL 12  . L-Lysine 500 MG TABS Take by mouth as needed.      . Sodium Chloride-Sodium Bicarb (NETI POT SINUS WASH NA) Place into the nose.     No current facility-administered medications on file prior to visit.     Past Medical History:  Diagnosis Date  . Allergic rhinitis    former patient at [facility] Chest with allergy injections  . Asthma   . Colon polyp 2003  . Hyperlipidemia   . Polio   . seizure 1982   had a seizure x 1 76 yo, only one episode, ? really a seizure    Past Surgical History:  Procedure Laterality Date  . BREAST SURGERY     possible tumor-benign  .  COLONOSCOPY    . PATENT DUCTUS ARTERIOUS REPAIR     AGE 89  . TONSILLECTOMY      Social History   Socioeconomic History  . Marital status: Married    Spouse name: None  . Number of children: 2  . Years of education: None  . Highest education level: None  Social Needs  . Financial resource strain: None  . Food insecurity - worry: None  . Food insecurity - inability: None  . Transportation needs - medical: None  . Transportation needs - non-medical: None  Occupational History  . Occupation: teacher-small business owner-retired  Tobacco Use  . Smoking status: Never Smoker  . Smokeless tobacco: Never Used  Substance and Sexual Activity  . Alcohol use: Yes    Comment: 7  drinks per week  . Drug use: No  . Sexual activity: None  Other Topics Concern  . None  Social History Narrative   Lives with spouse; has grand children    Family History  Problem Relation Age of Onset  . Depression Mother   . Colon cancer Mother   . Depression Sister   . Alcohol abuse Father   . Breast cancer Paternal Grandmother   . Asthma Son   . Allergic rhinitis Son   . Pancreatic cancer Sister        2010  . Other Sister        polio  . Pancreatic cancer Sister     Review of Systems  Constitutional: Negative for chills and fever.  HENT: Positive for congestion, ear pain and sinus pressure.   Respiratory: Positive for cough, shortness of breath and wheezing.   Neurological: Negative for light-headedness and headaches.       Objective:   Vitals:   06/17/17 1024  BP: 128/82  Pulse: 78  Resp: 16  Temp: 98.6 F (37 C)  SpO2: 98%   Wt Readings from Last 3 Encounters:  06/17/17 130 lb (59 kg)  06/14/17 132 lb 6.4 oz (60.1 kg)  02/18/17 130 lb (59 kg)   Body mass index is 22.31 kg/m.   Physical Exam    Constitutional: Appears well-developed and well-nourished. No distress.  HENT:  Head: Normocephalic and atraumatic.  Neck: Neck supple. No tracheal deviation present. No thyromegaly present.  No cervical lymphadenopathy Cardiovascular: Normal rate, regular rhythm and normal heart sounds.   No murmur heard. No edema Pulmonary/Chest: Effort normal and breath sounds normal. No respiratory distress. No has no wheezes. No rales.  Skin: Skin is warm and dry. Not diaphoretic.  Psychiatric: Normal mood and affect. Behavior is normal.      Assessment & Plan:    See Problem List for Assessment and Plan of chronic medical problems.

## 2017-07-01 NOTE — Progress Notes (Signed)
Subjective:    Patient ID: Susan Chavez, female    DOB: Feb 13, 1941, 76 y.o.   MRN: [id number]  HPI She is here for an acute visit for cold symptoms.  She was seen 11/26 by Dr Jerline Pain and then I saw her on 11/29 for similar symptoms.  She started a zpak on 11/26.  Her symptoms initially improved, but then stopping improving and her sinus pain/pressure started to worsen.    She is experiencing sinus pressure that is significant, swelling under her eyes, postnasal drip, productive cough that is occasionally discolored and fatigue.  She does have her shortness of breath and wheezing that she typically gets when she has an upper respiratory infection.  She has also had headaches.  She denies fever.  She has no ear pain or sore throat.  She has been using the neti pot twice a day.  She is taking mucinex, vitamin c and her inhalers.  Medications and allergies reviewed with patient and updated if appropriate.  Patient Active Problem List   Diagnosis Date Noted  . Anxiety 01/12/2017  . Asthma 01/12/2017  . Prediabetes 01/12/2017  . Sleep difficulties 11/20/2015  . Asthmatic bronchitis with acute exacerbation 05/29/2015  . Osteopenia 01/18/2015  . Essential hypertension 10/24/2014  . S/P repair of patent ductus arteriosus 12/11/2010  . BENIGN POSITIONAL VERTIGO 06/09/2010  . OVARIAN CYST 06/09/2010  . History of colonic polyps 09/14/2008  . Seasonal and perennial allergic rhinitis 07/25/2007  . Hyperlipidemia 10/14/2006    Current Outpatient Medications on File Prior to Visit  Medication Sig Dispense Refill  . albuterol (VENTOLIN HFA) 108 (90 Base) MCG/ACT inhaler Inhale 1-2 puffs into the lungs every 6 (six) hours as needed for wheezing or shortness of breath.    . Ascorbic Acid (VITAMIN C) 1000 MG tablet Take 1,000 mg by mouth daily.    . DULERA 200-5 MCG/ACT AERO USE 2 PUFFS TWICE DAILY THEN RINSE MOUTH- MAINTENANCE 13 g 5  . escitalopram (LEXAPRO) 5 MG tablet Take 1 tablet (5  mg total) by mouth daily. 90 tablet 3  . hydrochlorothiazide (HYDRODIURIL) 25 MG tablet Take 1 tablet (25 mg total) by mouth daily. 90 tablet 3  . ipratropium (ATROVENT) 0.06 % nasal spray Place 2 sprays into both nostrils 4 (four) times daily. 15 mL 12  . L-Lysine 500 MG TABS Take by mouth as needed.      . Sodium Chloride-Sodium Bicarb (NETI POT SINUS WASH NA) Place into the nose.     No current facility-administered medications on file prior to visit.     Past Medical History:  Diagnosis Date  . Allergic rhinitis    former patient at [facility] Chest with allergy injections  . Asthma   . Colon polyp 2003  . Hyperlipidemia   . Polio   . seizure 1982   had a seizure x 1 76 yo, only one episode, ? really a seizure    Past Surgical History:  Procedure Laterality Date  . BREAST SURGERY     possible tumor-benign  . COLONOSCOPY    . PATENT DUCTUS ARTERIOUS REPAIR     AGE 50  . TONSILLECTOMY      Social History   Socioeconomic History  . Marital status: Married    Spouse name: None  . Number of children: 2  . Years of education: None  . Highest education level: None  Social Needs  . Financial resource strain: None  . Food insecurity - worry: None  . Food  insecurity - inability: None  . Transportation needs - medical: None  . Transportation needs - non-medical: None  Occupational History  . Occupation: teacher-small business owner-retired  Tobacco Use  . Smoking status: Never Smoker  . Smokeless tobacco: Never Used  Substance and Sexual Activity  . Alcohol use: Yes    Comment: 7 drinks per week  . Drug use: No  . Sexual activity: None  Other Topics Concern  . None  Social History Narrative   Lives with spouse; has grand children    Family History  Problem Relation Age of Onset  . Depression Mother   . Colon cancer Mother   . Depression Sister   . Alcohol abuse Father   . Breast cancer Paternal Grandmother   . Asthma Son   . Allergic rhinitis Son   . Pancreatic  cancer Sister        2010  . Other Sister        polio  . Pancreatic cancer Sister     Review of Systems  Constitutional: Positive for fatigue. Negative for chills and fever.  HENT: Positive for congestion, postnasal drip, sinus pressure and sinus pain. Negative for ear pain and sore throat.   Respiratory: Positive for cough, shortness of breath and wheezing.   Neurological: Positive for headaches. Negative for dizziness and light-headedness.       Objective:   Vitals:   07/02/17 0907  BP: 134/84  Pulse: 64  Resp: 16  Temp: 98.6 F (37 C)  SpO2: 97%   Filed Weights   07/02/17 0907  Weight: 131 lb (59.4 kg)   Body mass index is 22.49 kg/m.  Wt Readings from Last 3 Encounters:  07/02/17 131 lb (59.4 kg)  06/17/17 130 lb (59 kg)  06/14/17 132 lb 6.4 oz (60.1 kg)     Physical Exam GENERAL APPEARANCE: Appears stated age, well appearing, NAD EYES: conjunctiva clear, no icterus HEENT: bilateral tympanic membranes and ear canals normal, oropharynx with mild erythema, no thyromegaly, trachea midline, no cervical or supraclavicular lymphadenopathy LUNGS: Clear to auscultation without wheeze or crackles, unlabored breathing, good air entry bilaterally CARDIOVASCULAR: Normal S1,S2 without murmurs, no edema SKIN: warm, dry        Assessment & Plan:   See Problem List for Assessment and Plan of chronic medical problems.

## 2017-07-02 ENCOUNTER — Encounter: Payer: Self-pay | Admitting: Internal Medicine

## 2017-07-02 ENCOUNTER — Ambulatory Visit: Payer: Medicare Other | Admitting: Internal Medicine

## 2017-07-02 VITALS — BP 134/84 | HR 64 | Temp 98.6°F | Resp 16 | Wt 131.0 lb

## 2017-07-02 DIAGNOSIS — J4541 Moderate persistent asthma with (acute) exacerbation: Secondary | ICD-10-CM | POA: Diagnosis not present

## 2017-07-02 DIAGNOSIS — J0100 Acute maxillary sinusitis, unspecified: Secondary | ICD-10-CM

## 2017-07-02 MED ORDER — CEFDINIR 300 MG PO CAPS: 300.0000 mg | ORAL_CAPSULE | Freq: Two times a day (BID) | ORAL | 0 refills | Status: DC

## 2017-07-02 NOTE — Assessment & Plan Note (Addendum)
Will start omnicef Continue Nettie pot, add ibuprofen Continue Mucinex We will hold off on steroids given that she had significant swelling in the past with prednisone Call if no improvement

## 2017-07-02 NOTE — Assessment & Plan Note (Signed)
Still experiencing asthmatic bronchitis Omnicef started Continue inhalers Lungs sound clear, but she is expressing some shortness of breath and wheezing-deferred steroids at this time given reaction to prednisone in the past Call if no improvement

## 2017-07-02 NOTE — Patient Instructions (Addendum)
    Medications reviewed and updated.  Changes include starting omnicef for your sinus infection.  Take advil for the sinus pressure.  Your prescription(s) have been submitted to your pharmacy. Please take as directed and contact our office if you believe you are having problem(s) with the medication(s).  Call if no improvement

## 2017-07-09 DIAGNOSIS — Z6822 Body mass index (BMI) 22.0-22.9, adult: Secondary | ICD-10-CM | POA: Diagnosis not present

## 2017-07-09 DIAGNOSIS — Z1231 Encounter for screening mammogram for malignant neoplasm of breast: Secondary | ICD-10-CM | POA: Diagnosis not present

## 2017-07-09 DIAGNOSIS — N3941 Urge incontinence: Secondary | ICD-10-CM | POA: Diagnosis not present

## 2017-07-09 DIAGNOSIS — Z124 Encounter for screening for malignant neoplasm of cervix: Secondary | ICD-10-CM | POA: Diagnosis not present

## 2017-07-09 DIAGNOSIS — Z01419 Encounter for gynecological examination (general) (routine) without abnormal findings: Secondary | ICD-10-CM | POA: Diagnosis not present

## 2017-07-22 DIAGNOSIS — H2511 Age-related nuclear cataract, right eye: Secondary | ICD-10-CM | POA: Diagnosis not present

## 2017-07-22 DIAGNOSIS — H25811 Combined forms of age-related cataract, right eye: Secondary | ICD-10-CM | POA: Diagnosis not present

## 2017-07-28 ENCOUNTER — Encounter: Payer: Self-pay | Admitting: Internal Medicine

## 2017-07-28 ENCOUNTER — Ambulatory Visit (INDEPENDENT_AMBULATORY_CARE_PROVIDER_SITE_OTHER): Payer: Medicare Other | Admitting: Internal Medicine

## 2017-07-28 ENCOUNTER — Ambulatory Visit (INDEPENDENT_AMBULATORY_CARE_PROVIDER_SITE_OTHER)
Admission: RE | Admit: 2017-07-28 | Discharge: 2017-07-28 | Disposition: A | Discharge status: Home / Self Care | Payer: Medicare Other | Source: Ambulatory Visit | Attending: Internal Medicine | Admitting: Internal Medicine

## 2017-07-28 VITALS — BP 136/62 | HR 82 | Ht 64.0 in | Wt 130.2 lb

## 2017-07-28 DIAGNOSIS — J0100 Acute maxillary sinusitis, unspecified: Secondary | ICD-10-CM | POA: Diagnosis not present

## 2017-07-28 DIAGNOSIS — J4541 Moderate persistent asthma with (acute) exacerbation: Secondary | ICD-10-CM

## 2017-07-28 DIAGNOSIS — R05 Cough: Secondary | ICD-10-CM | POA: Diagnosis not present

## 2017-07-28 MED ORDER — MONTELUKAST SODIUM 10 MG PO TABS: 10.0000 mg | ORAL_TABLET | Freq: Every day | ORAL | 11 refills | Status: DC

## 2017-07-28 MED ORDER — FLUTICASONE-UMECLIDIN-VILANT 100-62.5-25 MCG/INH IN AEPB: 1.0000 | INHALATION_SPRAY | Freq: Every day | RESPIRATORY_TRACT | 0 refills | Status: DC

## 2017-07-28 MED ORDER — AZITHROMYCIN 250 MG PO TABS: ORAL_TABLET | ORAL | 0 refills | Status: DC

## 2017-07-28 NOTE — Patient Instructions (Addendum)
Sample Trelegy inhaler      Inhale 1 puff, then rinse mouth, once daily     Try this instead of Dulera. When you run out of the sample, go back to [facility].  Ok to use the ventolin rescue inhaler or your nebulizer if needed  Meanwhile, you can ask your pharmacy or your insurance formulary whether Advair, Symbicort or Breo would be cheaper for you than Dulera- just for information.  Script sent to restart Singulair for now.  Order CXR   Dx Asthmatic bronchitis  Exacerbation  Ok to use otc cough meds as needed

## 2017-07-28 NOTE — Progress Notes (Signed)
F never smoker  asthma; constant cough per patient. PFT 10/06/2007- WNL. FVC 2.66/ 88%, FEV1 2.14/ 98%, FEV1/FVC 0.8, TLC 105%, DLCO 98% Allergy Profile 08/01/13- Total IgE 151.4, Broad elevations for many common environmental allergens FENO 05/21/16- 48= elevation consistent with allergic component to asthma ---------------------------------------------  07/23/2016-77 year old female never smoker followed for asthma, chronic cough, allergic rhinitis complicated by GERD FOLLOWS FOR: SINUSES ARE stopped up,back of eyes hurt, thinks she'e got a cold Acute illness-head cold-ears stopped up, nasal congestion, retro-orbital pressure, nasal stuffiness and some itching. Sore throat. Denies fever or purulent discharge so far. Denies chest tightness, cough or wheeze so far.  07/28/17- 77 year old female never smoker followed for asthma, chronic cough, allergic rhinitis complicated by GERD -----Asthma flare up-cough(productive-clear). Wheezing as well. Increased productive cough since a sinus infection at Thanksgiving.  Mucus is clear.  Had a cold again at Christmas.  Wheeze.  Continues Dulera 200 with occasional albuterol rescue inhaler.  Has glaucoma.  ROS-see HPI + = positive Constitutional:   No-   weight loss, night sweats, fevers, chills, fatigue, lassitude. HEENT:   No-  headaches, difficulty swallowing, tooth/dental problems, +sore throat,       No-  sneezing, itching, ear ache, +nasal congestion, +post nasal drip,  CV:  No-   chest pain, orthopnea, PND, swelling in lower extremities, anasarca,                                                         dizziness, palpitations Resp:   shortness of breath with exertion or at rest.             Sign productive cough,   non-productive cough,  No- coughing up of blood.              No-   change in color of mucus.  No- wheezing.   Skin: No-   rash or lesions. GI:  No- heartburn, indigestion, abdominal pain, nausea, vomiting,  GU:  MS:  No-   joint pain or  swelling.   Neuro-     nothing unusual Psych:  No- change in mood or affect. No depression +anxiety.  No memory loss.  OBJ- Physical Exam General- Alert, Oriented, Affect-appropriate, Distress- none acute, trim, alert Skin- rash-none, lesions- none, excoriation- none Lymphadenopathy- none Head- atraumatic            Eyes- Gross vision intact, PERRLA, conjunctivae and secretions clear            Ears- Hearing, canals-normal,             Nose- + turbinate edema, no-Septal dev, mucus, polyps, erosion, perforation             Throat- Mallampati II , mucosa clear , drainage- none, tonsils- atrophic Neck- flexible , trachea midline, no stridor , thyroid nl, carotid no bruit Chest - symmetrical excursion , unlabored           Heart/CV- RRR , no murmur , no gallop  , no rub, nl s1 s2                           - JVD- none , edema- none, stasis changes- none, varices- none           Lung-  wheeze-none , cough ,  dullness-none, rub- none           Chest wall-  Abd-  Br/ Gen/ Rectal- Not done, not indicated Extrem- cyanosis- none, clubbing, none, atrophy- none, strength- nl Neuro- grossly intact to observation

## 2017-08-01 NOTE — Assessment & Plan Note (Signed)
Decided not to add an antibiotic for possible low-grade persistent sinusitis at this time.  Keep the issue under observation.

## 2017-08-01 NOTE — Assessment & Plan Note (Signed)
Probation consistent with a post viral bronchitis Plan-CXR, resume Singulair, try sample Trelegy instead of [facility]

## 2017-08-13 ENCOUNTER — Telehealth: Payer: Self-pay | Admitting: Internal Medicine

## 2017-08-13 NOTE — Telephone Encounter (Signed)
DC Dulera and change to Symbicort 160, # 1,   Inhale 2 puffs, then rinse mouth, twice daily    Ref x 12

## 2017-08-13 NOTE — Telephone Encounter (Signed)
Called and spoke with patient  Pt advised she is using Dulera and would like something cheaper like Advair, symbicort or Breo Routing message to [facility] for review  CY please advise

## 2017-08-16 MED ORDER — BUDESONIDE-FORMOTEROL FUMARATE 160-4.5 MCG/ACT IN AERO: 2.0000 | INHALATION_SPRAY | Freq: Two times a day (BID) | RESPIRATORY_TRACT | 12 refills | Status: DC

## 2017-08-16 NOTE — Telephone Encounter (Signed)
Called pt letting her know that CY said we could switch her from [facility] to Symbicort. Pt expressed understanding. DC'd dulera from pt's med list and sent Rx of symbicort 160 to pt's preferred pharmacy.  Nothing further needed at this current time.

## 2017-08-16 NOTE — Telephone Encounter (Signed)
Pt calling back. Cb is [phone].

## 2017-08-16 NOTE — Telephone Encounter (Signed)
LMOMTCB x 1 

## 2017-08-19 DIAGNOSIS — H2512 Age-related nuclear cataract, left eye: Secondary | ICD-10-CM | POA: Diagnosis not present

## 2017-08-19 DIAGNOSIS — H25812 Combined forms of age-related cataract, left eye: Secondary | ICD-10-CM | POA: Diagnosis not present

## 2017-08-30 ENCOUNTER — Ambulatory Visit: Payer: Medicare Other | Admitting: Internal Medicine

## 2017-08-30 ENCOUNTER — Encounter: Payer: Self-pay | Admitting: Internal Medicine

## 2017-08-30 VITALS — BP 150/82 | HR 66 | Temp 98.7°F | Resp 16 | Wt 130.0 lb

## 2017-08-30 DIAGNOSIS — J4541 Moderate persistent asthma with (acute) exacerbation: Secondary | ICD-10-CM

## 2017-08-30 MED ORDER — PREDNISONE 10 MG PO TABS
ORAL_TABLET | ORAL | 0 refills | Status: DC
Start: 2017-08-30 — End: 2017-09-15

## 2017-08-30 MED ORDER — HYDROCODONE-HOMATROPINE 5-1.5 MG/5ML PO SYRP: 5.0000 mL | ORAL_SOLUTION | Freq: Three times a day (TID) | ORAL | 0 refills | Status: DC | PRN

## 2017-08-30 MED ORDER — CEFDINIR 300 MG PO CAPS: 300.0000 mg | ORAL_CAPSULE | Freq: Two times a day (BID) | ORAL | 0 refills | Status: DC

## 2017-08-30 NOTE — Assessment & Plan Note (Signed)
Symptoms and exam consistent with asthmatic bronchitis with acute exacerbation Start Omnicef twice daily times 10 days Continue inhalers Prednisone taper Hycodan cough syrup as needed Call if no improvement

## 2017-08-30 NOTE — Progress Notes (Signed)
Subjective:    Patient ID: Susan Chavez, female    DOB: Nov 15, 1940, 77 y.o.   MRN: [id number]  HPI She is here for an acute visit for cold symptoms.  Her symptoms started about one week ago and got worse over the weekend.   She is experiencing cough with thick sputum,, increased shortness of breath, wheezing, fevers, fatigue, nasal congestion, sinus pressure and headaches.  She has not noticed any significant ear pain, sore throat, GI symptoms or body aches.  She has been using her inhalers.  She has taken Tylenol.  She has not taken anything for the cough.  Medications and allergies reviewed with patient and updated if appropriate.  Patient Active Problem List   Diagnosis Date Noted  . Subacute maxillary sinusitis 07/02/2017  . Anxiety 01/12/2017  . Asthma 01/12/2017  . Prediabetes 01/12/2017  . Sleep difficulties 11/20/2015  . Asthmatic bronchitis with acute exacerbation 05/29/2015  . Osteopenia 01/18/2015  . Essential hypertension 10/24/2014  . S/P repair of patent ductus arteriosus 12/11/2010  . BENIGN POSITIONAL VERTIGO 06/09/2010  . OVARIAN CYST 06/09/2010  . History of colonic polyps 09/14/2008  . Seasonal and perennial allergic rhinitis 07/25/2007  . Hyperlipidemia 10/14/2006    Current Outpatient Medications on File Prior to Visit  Medication Sig Dispense Refill  . albuterol (VENTOLIN HFA) 108 (90 Base) MCG/ACT inhaler Inhale 1-2 puffs into the lungs every 6 (six) hours as needed for wheezing or shortness of breath.    . budesonide-formoterol (SYMBICORT) 160-4.5 MCG/ACT inhaler Inhale 2 puffs into the lungs 2 (two) times daily. 1 Inhaler 12  . escitalopram (LEXAPRO) 5 MG tablet Take 1 tablet (5 mg total) by mouth daily. 90 tablet 3  . Fluticasone-Umeclidin-Vilant (TRELEGY ELLIPTA) 100-62.5-25 MCG/INH AEPB Inhale 1 puff into the lungs daily. 1 each 0  . hydrochlorothiazide (HYDRODIURIL) 25 MG tablet Take 1 tablet (25 mg total) by mouth daily. 90 tablet 3  .  L-Lysine 500 MG TABS Take by mouth as needed.      . montelukast (SINGULAIR) 10 MG tablet Take 1 tablet (10 mg total) by mouth at bedtime. 30 tablet 11  . Sodium Chloride-Sodium Bicarb (NETI POT SINUS [place] NA) Place into the nose.     No current facility-administered medications on file prior to visit.     Past Medical History:  Diagnosis Date  . Allergic rhinitis    former patient at [facility] Chest with allergy injections  . Asthma   . Colon polyp 2003  . Hyperlipidemia   . Polio   . seizure 1982   had a seizure x 1 77 yo, only one episode, ? really a seizure    Past Surgical History:  Procedure Laterality Date  . BREAST SURGERY     possible tumor-benign  . COLONOSCOPY    . PATENT DUCTUS ARTERIOUS REPAIR     AGE 65  . TONSILLECTOMY      Social History   Socioeconomic History  . Marital status: Married    Spouse name: None  . Number of children: 2  . Years of education: None  . Highest education level: None  Social Needs  . Financial resource strain: None  . Food insecurity - worry: None  . Food insecurity - inability: None  . Transportation needs - medical: None  . Transportation needs - non-medical: None  Occupational History  . Occupation: teacher-small business owner-retired  Tobacco Use  . Smoking status: Never Smoker  . Smokeless tobacco: Never Used  Substance and Sexual  Activity  . Alcohol use: Yes    Comment: 7 drinks per week  . Drug use: No  . Sexual activity: None  Other Topics Concern  . None  Social History Narrative   Lives with spouse; has grand children    Family History  Problem Relation Age of Onset  . Depression Mother   . Colon cancer Mother   . Depression Sister   . Alcohol abuse Father   . Breast cancer Paternal Grandmother   . Asthma Son   . Allergic rhinitis Son   . Pancreatic cancer Sister        2010  . Other Sister        polio  . Pancreatic cancer Sister     Review of Systems  Constitutional: Positive for fatigue and  fever. Negative for chills.  HENT: Positive for congestion and sinus pressure. Negative for ear pain (pressure only), sinus pain and sore throat.   Respiratory: Positive for cough (productive - discolored), shortness of breath and wheezing.   Gastrointestinal: Negative for diarrhea and nausea.  Musculoskeletal: Negative for myalgias.  Neurological: Positive for headaches.       Objective:   Vitals:   08/30/17 1057  BP: (!) 150/82  Pulse: 66  Resp: 16  Temp: 98.7 F (37.1 C)  SpO2: 96%   Filed Weights   08/30/17 1057  Weight: 130 lb (59 kg)   Body mass index is 22.31 kg/m.  Wt Readings from Last 3 Encounters:  08/30/17 130 lb (59 kg)  07/28/17 130 lb 3.2 oz (59.1 kg)  07/02/17 131 lb (59.4 kg)     Physical Exam GENERAL APPEARANCE: Appears stated age, well appearing, NAD EYES: conjunctiva clear, no icterus HEENT: bilateral tympanic membranes and ear canals normal, oropharynx with mild erythema, no thyromegaly, trachea midline, no cervical or supraclavicular lymphadenopathy LUNGS: unlabored breathing, good air entry bilaterally, mild wheeze on exam, no crackles CARDIOVASCULAR: Normal S1,S2 without murmurs, no edema SKIN: warm, dry        Assessment & Plan:   See Problem List for Assessment and Plan of chronic medical problems.

## 2017-08-30 NOTE — Patient Instructions (Addendum)
Take the antibiotic, steroids and cough syrup as prescribed.    Call if no improvement

## 2017-09-01 ENCOUNTER — Other Ambulatory Visit: Payer: Self-pay | Admitting: Internal Medicine

## 2017-09-01 MED ORDER — VALACYCLOVIR HCL 1 G PO TABS: ORAL_TABLET | ORAL | 5 refills | Status: AC

## 2017-09-01 MED ORDER — ACYCLOVIR 5 % EX OINT: 1.0000 "application " | TOPICAL_OINTMENT | CUTANEOUS | 5 refills | Status: AC

## 2017-09-15 ENCOUNTER — Encounter: Payer: Self-pay | Admitting: Internal Medicine

## 2017-09-15 ENCOUNTER — Ambulatory Visit: Payer: Medicare Other | Admitting: Internal Medicine

## 2017-09-15 VITALS — BP 114/80 | HR 75 | Temp 98.5°F | Resp 16 | Wt 133.0 lb

## 2017-09-15 DIAGNOSIS — J4541 Moderate persistent asthma with (acute) exacerbation: Secondary | ICD-10-CM

## 2017-09-15 MED ORDER — ESCITALOPRAM OXALATE 5 MG PO TABS: 5.0000 mg | ORAL_TABLET | Freq: Every day | ORAL | 3 refills | Status: DC

## 2017-09-15 MED ORDER — DOXYCYCLINE HYCLATE 100 MG PO TABS
100.0000 mg | ORAL_TABLET | Freq: Two times a day (BID) | ORAL | 0 refills | Status: DC
Start: 2017-09-15 — End: 2017-10-14

## 2017-09-15 NOTE — Patient Instructions (Signed)
Take the new antibiotic as prescribed -doxycycline.   Continue your current inhalers.  Take the cough medications.    Follow up with Dr Annamaria Boots.

## 2017-09-15 NOTE — Assessment & Plan Note (Signed)
Recurrent or not completed treated bronchitis with asthma exacerbation She can force a wheeze with expiration, but her lungs sound clear - will hold off on additional steroids Continue inhalers Cough is productive of discolored sputum so I will prescribe a 14 day course of doxycyline Benadryl at night, has cough syrup and tessalon perles at home Will see if Dr Annamaria Boots can see her sooner than next month - she looks ok today, but I do not want to continue to partially treat or give antibiotic after antibiotic Lung sound good, oxygenation is good - CXR last month - will hold off until on repeating today

## 2017-09-15 NOTE — Progress Notes (Signed)
Subjective:    Patient ID: Susan Chavez, female    DOB: 11/06/1940, 77 y.o.   MRN: [id number]  HPI She is here for an acute visit for cold symptoms.  Her symptoms started the beginning of this month.  I had seen her 08/30/17 and diagnosed her with asthmatic bronchitis.  She was placed on an antibiotic-Omnicef for 10 days.  She was given a prednisone taper, Hycodan cough syrup and was instructed to continue using her inhalers.   She states her symptoms did improve and she felt good for a few days, but then her symptoms started to worsen again.    She is experiencing fatigue, nasal congestion, left ear pain, sinus pressure, hoarseness, productive cough of discolored sputum, sob with stairs, wheeze and some headaches.    She denies any fever, chills, sore throat and diarrhea or nausea.    She is using her inhalers, taking benadryl.     Medications and allergies reviewed with patient and updated if appropriate.  Patient Active Problem List   Diagnosis Date Noted  . Subacute maxillary sinusitis 07/02/2017  . Anxiety 01/12/2017  . Asthma 01/12/2017  . Prediabetes 01/12/2017  . Sleep difficulties 11/20/2015  . Asthmatic bronchitis with acute exacerbation 05/29/2015  . Osteopenia 01/18/2015  . Essential hypertension 10/24/2014  . S/P repair of patent ductus arteriosus 12/11/2010  . BENIGN POSITIONAL VERTIGO 06/09/2010  . OVARIAN CYST 06/09/2010  . History of colonic polyps 09/14/2008  . Seasonal and perennial allergic rhinitis 07/25/2007  . Hyperlipidemia 10/14/2006    Current Outpatient Medications on File Prior to Visit  Medication Sig Dispense Refill  . acyclovir ointment (ZOVIRAX) 5 % Apply 1 application topically every 3 (three) hours. 15 g 5  . albuterol (VENTOLIN HFA) 108 (90 Base) MCG/ACT inhaler Inhale 1-2 puffs into the lungs every 6 (six) hours as needed for wheezing or shortness of breath.    . budesonide-formoterol (SYMBICORT) 160-4.5 MCG/ACT inhaler Inhale 2 puffs  into the lungs 2 (two) times daily. 1 Inhaler 12  . cefdinir (OMNICEF) 300 MG capsule Take 1 capsule (300 mg total) by mouth 2 (two) times daily. 20 capsule 0  . escitalopram (LEXAPRO) 5 MG tablet Take 1 tablet (5 mg total) by mouth daily. 90 tablet 3  . Fluticasone-Umeclidin-Vilant (TRELEGY ELLIPTA) 100-62.5-25 MCG/INH AEPB Inhale 1 puff into the lungs daily. 1 each 0  . hydrochlorothiazide (HYDRODIURIL) 25 MG tablet Take 1 tablet (25 mg total) by mouth daily. 90 tablet 3  . L-Lysine 500 MG TABS Take by mouth as needed.      . montelukast (SINGULAIR) 10 MG tablet Take 1 tablet (10 mg total) by mouth at bedtime. 30 tablet 11  . predniSONE (DELTASONE) 10 MG tablet Take 4 tabs po qd x 3 days, then 3 tabs po qd x 3 days, then 2 tabs po qd x 3 days, then 1 tab po qd x 3 days 30 tablet 0  . Sodium Chloride-Sodium Bicarb (NETI POT SINUS [place] NA) Place into the nose.    . valACYclovir (VALTREX) 1000 MG tablet 2 tabs po Q 12 hrs x 1 day for fever blister 12 tablet 5   No current facility-administered medications on file prior to visit.     Past Medical History:  Diagnosis Date  . Allergic rhinitis    former patient at [facility] with allergy injections  . Asthma   . Colon polyp 2003  . Hyperlipidemia   . Polio   . seizure 1982   had  a seizure x 1 77 yo, only one episode, ? really a seizure    Past Surgical History:  Procedure Laterality Date  . BREAST SURGERY     possible tumor-benign  . COLONOSCOPY    . PATENT DUCTUS ARTERIOUS REPAIR     AGE 88  . TONSILLECTOMY      Social History   Socioeconomic History  . Marital status: Married    Spouse name: None  . Number of children: 2  . Years of education: None  . Highest education level: None  Social Needs  . Financial resource strain: None  . Food insecurity - worry: None  . Food insecurity - inability: None  . Transportation needs - medical: None  . Transportation needs - non-medical: None  Occupational History  . Occupation:  teacher-small business owner-retired  Tobacco Use  . Smoking status: Never Smoker  . Smokeless tobacco: Never Used  Substance and Sexual Activity  . Alcohol use: Yes    Comment: 7 drinks per week  . Drug use: No  . Sexual activity: None  Other Topics Concern  . None  Social History Narrative   Lives with spouse; has grand children    Family History  Problem Relation Age of Onset  . Depression Mother   . Colon cancer Mother   . Depression Sister   . Alcohol abuse Father   . Breast cancer Paternal Grandmother   . Asthma Son   . Allergic rhinitis Son   . Pancreatic cancer Sister        2010  . Other Sister        polio  . Pancreatic cancer Sister     Review of Systems  Constitutional: Positive for fatigue. Negative for chills and fever.  HENT: Positive for congestion, ear pain (left - intermittent), sinus pressure (sometimes) and voice change. Negative for sore throat.   Respiratory: Positive for cough (productive - discolored), shortness of breath (with stairs) and wheezing.   Gastrointestinal: Negative for diarrhea and nausea.  Neurological: Positive for headaches (mild, occ). Negative for light-headedness.       Objective:   Vitals:   09/15/17 1419  BP: 114/80  Pulse: 75  Resp: 16  Temp: 98.5 F (36.9 C)  SpO2: 97%   Filed Weights   09/15/17 1419  Weight: 133 lb (60.3 kg)   Body mass index is 22.83 kg/m.  Wt Readings from Last 3 Encounters:  09/15/17 133 lb (60.3 kg)  08/30/17 130 lb (59 kg)  07/28/17 130 lb 3.2 oz (59.1 kg)     Physical Exam GENERAL APPEARANCE: Appears stated age, well appearing but coughing frequently, NAD EYES: conjunctiva clear, no icterus HEENT: bilateral tympanic membranes and ear canals normal, oropharynx with no erythema, no thyromegaly, trachea midline, no cervical or supraclavicular lymphadenopathy LUNGS: Clear to auscultation without wheeze or crackles, unlabored breathing, good air entry bilaterally CARDIOVASCULAR:  Normal S1,S2 without murmurs, no edema SKIN: warm, dry        Assessment & Plan:   See Problem List for Assessment and Plan of chronic medical problems.

## 2017-09-16 ENCOUNTER — Ambulatory Visit: Payer: Medicare Other | Admitting: Internal Medicine

## 2017-09-16 ENCOUNTER — Other Ambulatory Visit: Payer: Self-pay | Admitting: Internal Medicine

## 2017-09-16 ENCOUNTER — Encounter: Payer: Self-pay | Admitting: Internal Medicine

## 2017-09-16 VITALS — BP 132/70 | HR 90 | Ht 64.0 in | Wt 128.8 lb

## 2017-09-16 DIAGNOSIS — J209 Acute bronchitis, unspecified: Secondary | ICD-10-CM

## 2017-09-16 DIAGNOSIS — J4531 Mild persistent asthma with (acute) exacerbation: Secondary | ICD-10-CM | POA: Diagnosis not present

## 2017-09-16 DIAGNOSIS — J0100 Acute maxillary sinusitis, unspecified: Secondary | ICD-10-CM

## 2017-09-16 MED ORDER — LEVALBUTEROL HCL 0.63 MG/3ML IN NEBU
0.6300 mg | INHALATION_SOLUTION | Freq: Once | RESPIRATORY_TRACT | Status: AC
  Administered 2017-09-16: 0.63 mg via RESPIRATORY_TRACT

## 2017-09-16 NOTE — Progress Notes (Signed)
F never smoker  asthma; constant cough per patient. PFT 10/06/2007- WNL. FVC 2.66/ 88%, FEV1 2.14/ 98%, FEV1/FVC 0.8, TLC 105%, DLCO 98% Allergy Profile 08/01/13- Total IgE 151.4, Broad elevations for many common environmental allergens FENO 05/21/16- 48= elevation consistent with allergic component to asthma ---------------------------------------------  07/28/17- 77 year old female never smoker followed for asthma, chronic cough, allergic rhinitis complicated by GERD -----Asthma flare up-cough(productive-clear). Wheezing as well. Increased productive cough since a sinus infection at Thanksgiving.  Mucus is clear.  Had a cold again at Christmas.  Wheeze.  Continues Dulera 200 with occasional albuterol rescue inhaler.  Has glaucoma.  09/16/17-  77 year old female never smoker followed for asthma, chronic cough, allergic rhinitis complicated by GERD ----Asthma; Per Dr. Quay Burow. Pt has had a cold for about 2 months now; cough-productive-yellow in color; SOB and wheezing. As well as sinus congestion. Denies any fever or chills.  Doxycycline now. Trelegy samples didn't make big difference.  Symbicort 160, albuterol hfa, Singulair Acute illness began as a viral syndrome.  Now on doxycycline, just started.  Had Omnicef times 10 days with prednisone taper earlier.  Blowing a lot out of nose with much cough.  Mucus now mostly clear.  Minimal headache. CXR 07/28/17 IMPRESSION: Upper limits normal heart size and mild peribronchial thickening again noted. No active cardiopulmonary disease.  ROS-see HPI + = positive Constitutional:   No-   weight loss, night sweats, fevers, chills, fatigue, lassitude. HEENT:  +  headaches, difficulty swallowing, tooth/dental problems, +sore throat,       No-  sneezing, itching, ear ache, +nasal congestion, +post nasal drip,  CV:  No-   chest pain, orthopnea, PND, swelling in lower extremities, anasarca,                                                         dizziness,  palpitations Resp:   shortness of breath with exertion or at rest.             Sign productive cough,   non-productive cough,  No- coughing up of blood.             +   change in color of mucus.  No- wheezing.   Skin: No-   rash or lesions. GI:  No- heartburn, indigestion, abdominal pain, nausea, vomiting,  GU:  MS:  No-   joint pain or swelling.   Neuro-     nothing unusual Psych:  No- change in mood or affect. No depression +anxiety.  No memory loss.  OBJ- Physical Exam General- Alert, Oriented, Affect-appropriate, Distress- none acute, trim, alert Skin- rash-none, lesions- none, excoriation- none  +warm/ clammy Lymphadenopathy- none Head- atraumatic            Eyes- Gross vision intact, PERRLA, conjunctivae and secretions clear            Ears- Hearing, canals-normal,             Nose- + turbinate edema, no-Septal dev, mucus, polyps, erosion, perforation             Throat- Mallampati II , mucosa clear , drainage- none, tonsils- atrophic Neck- flexible , trachea midline, no stridor , thyroid nl, carotid no bruit Chest - symmetrical excursion , unlabored           Heart/CV- RRR , no murmur ,  no gallop  , no rub, nl s1 s2                           - JVD- none , edema- none, stasis changes- none, varices- none           Lung-  wheeze-none , cough+ , dullness-none, rub- none, + mild coarseness right base           Chest wall-  Abd-  Br/ Gen/ Rectal- Not done, not indicated Extrem- cyanosis- none, clubbing, none, atrophy- none, strength- nl Neuro- grossly intact to observation

## 2017-09-16 NOTE — Assessment & Plan Note (Signed)
Sustained bronchitis. Plan-CBC with differential, finish doxycycline, neb treatments Xopenex

## 2017-09-16 NOTE — Patient Instructions (Signed)
Order lab- CBC w diff    Dx acute bronchitis  Order- limited CT max face/ sinuses      Dx acute maxillary sinusitis  Finish the doxycycline  Mucinex -DM may help cough and to clear secretions   Ok to use the Neti pot, cough perles, and cough syrup

## 2017-09-16 NOTE — Assessment & Plan Note (Signed)
Suspect low-grade sinusitis may be sustaining her respiratory inflammation. Plan-limited CT sinuses.  Finish doxycycline

## 2017-09-17 DIAGNOSIS — Z961 Presence of intraocular lens: Secondary | ICD-10-CM | POA: Diagnosis not present

## 2017-09-20 ENCOUNTER — Ambulatory Visit (INDEPENDENT_AMBULATORY_CARE_PROVIDER_SITE_OTHER)
Admission: RE | Admit: 2017-09-20 | Discharge: 2017-09-20 | Disposition: A | Discharge status: Home / Self Care | Payer: Medicare Other | Source: Ambulatory Visit | Attending: Internal Medicine | Admitting: Internal Medicine

## 2017-09-20 DIAGNOSIS — J0100 Acute maxillary sinusitis, unspecified: Secondary | ICD-10-CM | POA: Diagnosis not present

## 2017-09-27 ENCOUNTER — Telehealth: Payer: Self-pay | Admitting: Internal Medicine

## 2017-09-27 NOTE — Telephone Encounter (Signed)
Spoke with patient. She stated that she is still coughing up discolored mucus. She was seen by CY on 09/16/17 and is not feeling any better. She wanted to know if she could in to be seen again since she has been on 4 different abxs or if she needs to have a CXR as well.   Advised patient that CY was not in today and I could send the message to the DOD. Also advised that CY had an opening for tomorrow at 9am. Patient decided to see CY. She has been scheduled with CY for 3/12 at 9am.   Nothing else needed at time of call.

## 2017-09-28 ENCOUNTER — Telehealth: Payer: Self-pay | Admitting: Internal Medicine

## 2017-09-28 ENCOUNTER — Encounter: Payer: Self-pay | Admitting: Internal Medicine

## 2017-09-28 ENCOUNTER — Ambulatory Visit: Payer: Medicare Other | Admitting: Internal Medicine

## 2017-09-28 VITALS — BP 128/72 | HR 72 | Ht 64.0 in | Wt 129.4 lb

## 2017-09-28 DIAGNOSIS — J0100 Acute maxillary sinusitis, unspecified: Secondary | ICD-10-CM | POA: Diagnosis not present

## 2017-09-28 DIAGNOSIS — J4541 Moderate persistent asthma with (acute) exacerbation: Secondary | ICD-10-CM | POA: Diagnosis not present

## 2017-09-28 DIAGNOSIS — J42 Unspecified chronic bronchitis: Secondary | ICD-10-CM | POA: Diagnosis not present

## 2017-09-28 MED ORDER — GLYCOPYRROLATE-FORMOTEROL 9-4.8 MCG/ACT IN AERO: 2.0000 | INHALATION_SPRAY | Freq: Two times a day (BID) | RESPIRATORY_TRACT | 0 refills | Status: DC

## 2017-09-28 NOTE — Patient Instructions (Signed)
Order- Sputum C&S routine, fungal, AFB    Ok to take 200 mg of the benzonatate perles at a time,  Every 8 hours if needed for cough   Sample Bevespi inhaler     Inhale 2 puffs,    Twice daily

## 2017-09-28 NOTE — Telephone Encounter (Signed)
Left message for patient to call back to get scheduled.  

## 2017-09-28 NOTE — Telephone Encounter (Signed)
October 14, 2017 at 230pm. Thanks

## 2017-09-28 NOTE — Assessment & Plan Note (Signed)
Cough remains productive of dark yellow sputum.  Not resolved by 4 different antibiotics this winter, just finishing doxycycline.  She says cough is worse lying down and that aggressive reflux precautions seemed to stop a previous episode a few years ago although she does not recognize reflux symptoms at all. Plan-elevate head of bed, follow previously successful reflux precautions, sample Bevespi, sputum cultures

## 2017-09-28 NOTE — Progress Notes (Signed)
F never smoker  Asthmatic bronchitis; constant cough per patient. Silent GERD PFT 10/06/2007- WNL. FVC 2.66/ 88%, FEV1 2.14/ 98%, FEV1/FVC 0.8, TLC 105%, DLCO 98% Allergy Profile 08/01/13- Total IgE 151.4, Broad elevations for many common environmental allergens FENO 05/21/16- 48= elevation consistent with allergic component to asthma ---------------------------------------------  09/16/17-  77 year old female never smoker followed for asthmatic bronchitis, chronic cough, allergic rhinitis complicated by GERD ----. Pt has had a cold for about 2 months now; cough-productive-yellow in color; SOB and wheezing. As well as sinus congestion. Denies any fever or chills.  Doxycycline now. Trelegy samples didn't make big difference.  Symbicort 160, albuterol hfa, Singulair Acute illness began as a viral syndrome.  Now on doxycycline, just started.  Had Omnicef times 10 days with prednisone taper earlier.  Blowing a lot out of nose with much cough.  Mucus now mostly clear.  Minimal headache. CXR 07/28/17 IMPRESSION: Upper limits normal heart size and mild peribronchial thickening again noted. No active cardiopulmonary disease.  09/28/17-  77 year old female never smoker followed for asthmatic bronchitis, chronic cough, allergic rhinitis complicated by GERD --- Pt continues to have increased SOB with cough-hard and dark yellow to light brown in color-has gotten worse. Denies any fever or chills. 1 dose of Doxycyline left.  Ventolin HFA, Singulair, Symbicort 160 Completed Omnicef then doxycycline.  2 other antibiotics earlier this winter, after sustained cough pattern began around Thanksgiving.  Sinus pressure sensation has improved.  Denies reflux symptoms but remembers that aggressive reflux control measures stopped protracted coughing some years ago.  Cough is worse at night.  She is going to start sleeping propped up again.  Cough productive of dark yellow without fever. CT sinus 09/20/17- IMPRESSION: 1. Mild  mucosal thickening along the floor of the right maxillary sinus. 2. No acute fluid levels.  ROS-see HPI + = positive Constitutional:   No-   weight loss, night sweats, fevers, chills, fatigue, lassitude. HEENT:  +  headaches, difficulty swallowing, tooth/dental problems, sore throat,       No-  sneezing, itching, ear ache, +nasal congestion, +post nasal drip,  CV:  No-   chest pain, orthopnea, PND, swelling in lower extremities, anasarca,                                                         dizziness, palpitations Resp:   shortness of breath with exertion or at rest.             Sign productive cough,   non-productive cough,  No- coughing up of blood.             +   change in color of mucus.  No- wheezing.   Skin: No-   rash or lesions. GI:  No- heartburn, indigestion, abdominal pain, nausea, vomiting,  GU:  MS:  No-   joint pain or swelling.   Neuro-     nothing unusual Psych:  No- change in mood or affect. No depression +anxiety.  No memory loss.  OBJ- Physical Exam General- Alert, Oriented, Affect-appropriate, Distress- none acute, trim, alert Skin- rash-none, lesions- none, excoriation- none  +warm/ clammy Lymphadenopathy- none Head- atraumatic            Eyes- Gross vision intact, PERRLA, conjunctivae and secretions clear  Ears- Hearing, canals-normal,             Nose- + turbinate edema, no-Septal dev, mucus, polyps, erosion, perforation             Throat- Mallampati II , mucosa clear , drainage- none, tonsils- atrophic Neck- flexible , trachea midline, no stridor , thyroid nl, carotid no bruit Chest - symmetrical excursion , unlabored           Heart/CV- RRR , no murmur , no gallop  , no rub, nl s1 s2                           - JVD- none , edema- none, stasis changes- none, varices- none           Lung-  wheeze-none , cough+ persistent, dullness-none, rub- none,            Chest wall-  Abd-  Br/ Gen/ Rectal- Not done, not indicated Extrem- cyanosis- none,  clubbing, none, atrophy- none, strength- nl Neuro- grossly intact to observation

## 2017-09-28 NOTE — Assessment & Plan Note (Signed)
She has noted postnasal drainage and some sense of sinus pressure but CT does not show significant sinus disease.  Okay to use an OTC antihistamine if needed.

## 2017-09-29 NOTE — Telephone Encounter (Signed)
Spoke with patient. She has been scheduled with CY for 10/14/17 at 230. Nothing else needed at time of call.

## 2017-09-29 NOTE — Telephone Encounter (Signed)
lmtcb x2 for pt. 

## 2017-09-29 NOTE — Telephone Encounter (Signed)
Pt is calling back [phone]

## 2017-09-30 ENCOUNTER — Ambulatory Visit: Payer: Medicare Other | Admitting: Internal Medicine

## 2017-10-01 ENCOUNTER — Telehealth: Payer: Self-pay | Admitting: Internal Medicine

## 2017-10-01 ENCOUNTER — Other Ambulatory Visit (INDEPENDENT_AMBULATORY_CARE_PROVIDER_SITE_OTHER): Payer: Medicare Other

## 2017-10-01 ENCOUNTER — Ambulatory Visit (INDEPENDENT_AMBULATORY_CARE_PROVIDER_SITE_OTHER)
Admission: RE | Admit: 2017-10-01 | Discharge: 2017-10-01 | Disposition: A | Discharge status: Home / Self Care | Payer: Medicare Other | Source: Ambulatory Visit | Attending: Internal Medicine | Admitting: Internal Medicine

## 2017-10-01 DIAGNOSIS — J209 Acute bronchitis, unspecified: Secondary | ICD-10-CM | POA: Diagnosis not present

## 2017-10-01 DIAGNOSIS — J4541 Moderate persistent asthma with (acute) exacerbation: Secondary | ICD-10-CM

## 2017-10-01 DIAGNOSIS — R0602 Shortness of breath: Secondary | ICD-10-CM | POA: Diagnosis not present

## 2017-10-01 DIAGNOSIS — J42 Unspecified chronic bronchitis: Secondary | ICD-10-CM | POA: Diagnosis not present

## 2017-10-01 DIAGNOSIS — R05 Cough: Secondary | ICD-10-CM | POA: Diagnosis not present

## 2017-10-01 LAB — CBC WITH DIFFERENTIAL/PLATELET
BASOS ABS: 0.1 10*3/uL (ref 0.0–0.1)
Basophils Relative: 2 % (ref 0.0–3.0)
EOS ABS: 0.7 10*3/uL (ref 0.0–0.7)
Eosinophils Relative: 11.2 % — ABNORMAL HIGH (ref 0.0–5.0)
HEMATOCRIT: 37.4 % (ref 36.0–46.0)
Hemoglobin: 12.9 g/dL (ref 12.0–15.0)
LYMPHS PCT: 22.9 % (ref 12.0–46.0)
Lymphs Abs: 1.4 10*3/uL (ref 0.7–4.0)
MCHC: 34.4 g/dL (ref 30.0–36.0)
MCV: 91.9 fl (ref 78.0–100.0)
MONO ABS: 0.3 10*3/uL (ref 0.1–1.0)
Monocytes Relative: 5.2 % (ref 3.0–12.0)
NEUTROS ABS: 3.7 10*3/uL (ref 1.4–7.7)
Neutrophils Relative %: 58.7 % (ref 43.0–77.0)
PLATELETS: 301 10*3/uL (ref 150.0–400.0)
RBC: 4.07 Mil/uL (ref 3.87–5.11)
RDW: 12.8 % (ref 11.5–15.5)
WBC: 6.3 10*3/uL (ref 4.0–10.5)

## 2017-10-01 NOTE — Telephone Encounter (Signed)
Called and spoke with patient, advised her of CY results. She states that she will be by. cxr order placed, all other orders still pending. Nothing further needed.

## 2017-10-01 NOTE — Telephone Encounter (Signed)
I would ask that she come by and give the sputum cultures and have the CBC w diff drawn that are still pending orders.    Also I think it is time to repeat a CXR  For dx acute bronchitis

## 2017-10-01 NOTE — Telephone Encounter (Signed)
Spoke with the pt  She was seen here 09/28/17  She states despite ov recs- tessalon and bevespi, her cough is worse  She states the cough kept her up "all night" and even coughed to the point of vomiting  She is bringing up some light brown to yellow sputum  She states she has been feeling SOB since 3/14- walking any distance such as from room to room at home  No fever No wheezing, chest tightness  Please advise thanks!  Allergies  Allergen Reactions  . Tetanus Toxoid     Eyelid edema FROM HORSES; therefore , she was told she could not take Tetanus shot.She has had tetanus shot w/o adverse effect since 2002   Current Outpatient Medications on File Prior to Visit  Medication Sig Dispense Refill  . acyclovir ointment (ZOVIRAX) 5 % Apply 1 application topically every 3 (three) hours. 15 g 5  . albuterol (VENTOLIN HFA) 108 (90 Base) MCG/ACT inhaler Inhale 1-2 puffs into the lungs every 6 (six) hours as needed for wheezing or shortness of breath.    . budesonide-formoterol (SYMBICORT) 160-4.5 MCG/ACT inhaler Inhale 2 puffs into the lungs 2 (two) times daily. 1 Inhaler 12  . doxycycline (VIBRA-TABS) 100 MG tablet Take 1 tablet (100 mg total) by mouth 2 (two) times daily. 28 tablet 0  . escitalopram (LEXAPRO) 5 MG tablet Take 1 tablet (5 mg total) by mouth daily. 90 tablet 3  . Glycopyrrolate-Formoterol (BEVESPI AEROSPHERE) 9-4.8 MCG/ACT AERO Inhale 2 puffs into the lungs 2 (two) times daily. 1 Inhaler 0  . hydrochlorothiazide (HYDRODIURIL) 25 MG tablet Take 1 tablet (25 mg total) by mouth daily. 90 tablet 3  . L-Lysine 500 MG TABS Take by mouth as needed (for fever blisters).     . montelukast (SINGULAIR) 10 MG tablet Take 1 tablet (10 mg total) by mouth at bedtime. 30 tablet 11  . Sodium Chloride-Sodium Bicarb (NETI POT SINUS [place] NA) Place into the nose.    . valACYclovir (VALTREX) 1000 MG tablet 2 tabs po Q 12 hrs x 1 day for fever blister (Patient taking differently: 2 tabs po Q 12 hrs x 1  day for fever blister  AS NEEDED) 12 tablet 5  . VENTOLIN HFA 108 (90 Base) MCG/ACT inhaler INHALE 1 OR 2 PUFFS INTO THE LUNGS EVERY6 HOURS AS NEEDED FOR WHEEZING OR SHORTNESS OF BREATH 18 g 2   No current facility-administered medications on file prior to visit.

## 2017-10-03 ENCOUNTER — Other Ambulatory Visit: Payer: Self-pay

## 2017-10-03 ENCOUNTER — Encounter (HOSPITAL_COMMUNITY): Payer: Self-pay | Admitting: Emergency Medicine

## 2017-10-03 ENCOUNTER — Emergency Department (HOSPITAL_COMMUNITY)
Admission: EM | Admit: 2017-10-03 | Discharge: 2017-10-03 | Disposition: A | Discharge status: Home / Self Care | Payer: Medicare Other | Attending: Emergency Medicine | Admitting: Emergency Medicine

## 2017-10-03 DIAGNOSIS — Z79899 Other long term (current) drug therapy: Secondary | ICD-10-CM | POA: Insufficient documentation

## 2017-10-03 DIAGNOSIS — J4541 Moderate persistent asthma with (acute) exacerbation: Secondary | ICD-10-CM

## 2017-10-03 DIAGNOSIS — R0602 Shortness of breath: Secondary | ICD-10-CM | POA: Diagnosis present

## 2017-10-03 DIAGNOSIS — I10 Essential (primary) hypertension: Secondary | ICD-10-CM | POA: Diagnosis not present

## 2017-10-03 MED ORDER — PREDNISONE 10 MG PO TABS: 20.0000 mg | ORAL_TABLET | Freq: Two times a day (BID) | ORAL | 0 refills | Status: DC

## 2017-10-03 MED ORDER — ALBUTEROL SULFATE (2.5 MG/3ML) 0.083% IN NEBU: 2.5000 mg | INHALATION_SOLUTION | RESPIRATORY_TRACT | 0 refills | Status: DC | PRN

## 2017-10-03 MED ORDER — ALBUTEROL SULFATE (2.5 MG/3ML) 0.083% IN NEBU
5.0000 mg | INHALATION_SOLUTION | Freq: Once | RESPIRATORY_TRACT | Status: AC
  Administered 2017-10-03: 5 mg via RESPIRATORY_TRACT
  Filled 2017-10-03: qty 6

## 2017-10-03 NOTE — ED Triage Notes (Signed)
Pt comes in complaining of shortness of breath. Patient states the coughing woke her up around 0200 and she's been coughing since. Breathing labored with cough on assessment. Wheezing heard in all fields.

## 2017-10-03 NOTE — ED Provider Notes (Signed)
[facility] DEPT Provider Note   CSN: [id number] Arrival date & time: 10/03/17  3762     History   Chief Complaint Chief Complaint  Patient presents with  . Asthma  . Shortness of Breath    HPI Susan Chavez is a 77 y.o. female.  Patient is a 77 year old female with past medical history of asthma, hyperlipidemia presenting for evaluation of wheezing and cough.  This is been an ongoing problem for several weeks.  She recently completed an antibiotic which did not help.  She was seen by her pulmonologist on Friday and had blood work and a chest x-ray, both of these were unremarkable.  This evening she woke up having increased difficulty breathing, wheezing, and congestion.  Upon arrival to the ER, she was given a breathing treatment and she now feels significantly improved.  She denies fevers or chills.  She denies chest pain or leg swelling.   The history is provided by the patient.  Asthma  This is a recurrent problem. The current episode started 1 to 2 hours ago. The problem occurs constantly. The problem has not changed since onset.Nothing aggravates the symptoms. Relieved by: nebulizer in ED.    Past Medical History:  Diagnosis Date  . Allergic rhinitis    former patient at [facility] Chest with allergy injections  . Asthma   . Colon polyp 2003  . Hyperlipidemia   . Polio   . seizure 1982   had a seizure x 1 77 yo, only one episode, ? really a seizure    Patient Active Problem List   Diagnosis Date Noted  . Subacute maxillary sinusitis 07/02/2017  . Anxiety 01/12/2017  . Asthma 01/12/2017  . Prediabetes 01/12/2017  . Sleep difficulties 11/20/2015  . Asthmatic bronchitis with acute exacerbation 05/29/2015  . Osteopenia 01/18/2015  . Essential hypertension 10/24/2014  . S/P repair of patent ductus arteriosus 12/11/2010  . BENIGN POSITIONAL VERTIGO 06/09/2010  . OVARIAN CYST 06/09/2010  . History of colonic polyps 09/14/2008  . Seasonal  and perennial allergic rhinitis 07/25/2007  . Hyperlipidemia 10/14/2006    Past Surgical History:  Procedure Laterality Date  . BREAST SURGERY     possible tumor-benign  . COLONOSCOPY    . PATENT DUCTUS ARTERIOUS REPAIR     AGE 49  . TONSILLECTOMY      OB History    No data available       Home Medications    Prior to Admission medications   Medication Sig Start Date End Date Taking? Authorizing Provider  acyclovir ointment (ZOVIRAX) 5 % Apply 1 application topically every 3 (three) hours. 09/01/17   Binnie Rail, MD  albuterol (VENTOLIN HFA) 108 (90 Base) MCG/ACT inhaler Inhale 1-2 puffs into the lungs every 6 (six) hours as needed for wheezing or shortness of breath. 06/17/17   Binnie Rail, MD  budesonide-formoterol (SYMBICORT) 160-4.5 MCG/ACT inhaler Inhale 2 puffs into the lungs 2 (two) times daily. 08/16/17   Deneise Lever, MD  doxycycline (VIBRA-TABS) 100 MG tablet Take 1 tablet (100 mg total) by mouth 2 (two) times daily. 09/15/17   Binnie Rail, MD  escitalopram (LEXAPRO) 5 MG tablet Take 1 tablet (5 mg total) by mouth daily. 09/15/17   Binnie Rail, MD  Glycopyrrolate-Formoterol (BEVESPI AEROSPHERE) 9-4.8 MCG/ACT AERO Inhale 2 puffs into the lungs 2 (two) times daily. 09/28/17   Baird Lyons D, MD  hydrochlorothiazide (HYDRODIURIL) 25 MG tablet Take 1 tablet (25 mg total) by mouth  daily. 02/18/17   Binnie Rail, MD  L-Lysine 500 MG TABS Take by mouth as needed (for fever blisters).     [provider]  montelukast (SINGULAIR) 10 MG tablet Take 1 tablet (10 mg total) by mouth at bedtime. 07/28/17   Deneise Lever, MD  Sodium Chloride-Sodium Bicarb (NETI POT SINUS [place] NA) Place into the nose.    [provider]  valACYclovir (VALTREX) 1000 MG tablet 2 tabs po Q 12 hrs x 1 day for fever blister Patient taking differently: 2 tabs po Q 12 hrs x 1 day for fever blister  AS NEEDED 09/01/17   Binnie Rail, MD  VENTOLIN HFA 108 (90 Base) MCG/ACT  inhaler INHALE 1 OR 2 PUFFS INTO THE LUNGS EVERY6 HOURS AS NEEDED FOR WHEEZING OR SHORTNESS OF BREATH 09/17/17   Binnie Rail, MD    Family History Family History  Problem Relation Age of Onset  . Depression Mother   . Colon cancer Mother   . Alcohol abuse Father   . Depression Sister   . Breast cancer Paternal Grandmother   . Asthma Son   . Allergic rhinitis Son   . Pancreatic cancer Sister        2010  . Other Sister        polio  . Pancreatic cancer Sister     Social History Social History   Tobacco Use  . Smoking status: Never Smoker  . Smokeless tobacco: Never Used  Substance Use Topics  . Alcohol use: Yes    Comment: 7 drinks per week  . Drug use: No     Allergies   Tetanus toxoid   Review of Systems Review of Systems  All other systems reviewed and are negative.    Physical Exam Updated Vital Signs BP (!) 164/102 (BP Location: Left Arm)   Pulse 77   Temp 98.2 F (36.8 C) (Oral)   Resp (!) 26   Ht 5\' 4"  (1.626 m)   Wt 58.5 kg (129 lb)   SpO2 90%   BMI 22.14 kg/m   Physical Exam  Constitutional: She is oriented to person, place, and time. She appears well-developed and well-nourished. No distress.  HENT:  Head: Normocephalic and atraumatic.  Neck: Normal range of motion. Neck supple.  Cardiovascular: Normal rate and regular rhythm. Exam reveals no gallop and no friction rub.  No murmur heard. Pulmonary/Chest: Effort normal. No respiratory distress. She has no wheezes. She has rhonchi in the right middle field and the left middle field.  There are slight rhonchi bilaterally with expiration.  There is no respiratory distress.  She is able to complete sentences with no difficulty.  Abdominal: Soft. Bowel sounds are normal. She exhibits no distension. There is no tenderness.  Musculoskeletal: Normal range of motion.  Neurological: She is alert and oriented to person, place, and time.  Skin: Skin is warm and dry. She is not diaphoretic.  Nursing  note and vitals reviewed.    ED Treatments / Results  Labs (all labs ordered are listed, but only abnormal results are displayed) Labs Reviewed - No data to display  EKG  EKG Interpretation None       Radiology Dg Chest 2 View  Result Date: 10/01/2017 CLINICAL DATA:  Cough, shortness of breath and wheezing for 3 days. EXAM: CHEST - 2 VIEW COMPARISON:  PA and lateral chest 07/28/2017 and 11/03/2015. FINDINGS: The lungs are clear. Heart size is normal. There is no pneumothorax or pleural effusion. Aortic  atherosclerosis is noted. No acute bony abnormality. IMPRESSION: No acute disease. Electronically Signed   By: Inge Rise M.D.   On: 10/01/2017 14:15    Procedures Procedures (including critical care time)  Medications Ordered in ED Medications  albuterol (PROVENTIL) (2.5 MG/3ML) 0.083% nebulizer solution 5 mg (5 mg Nebulization Given 10/03/17 0530)     Initial Impression / Assessment and Plan / ED Course  I have reviewed the triage vital signs and the nursing notes.  Pertinent labs & imaging results that were available during my care of the patient were reviewed by me and considered in my medical decision making (see chart for details).  Patient's chest x-ray and blood tests from Friday reviewed and determined to be unremarkable.  She had dramatic improvement with an albuterol nebulizer treatment.  I suspect a flareup of her asthma.  I see no reason to perform another chest x-ray or laboratory studies.  I also highly doubt any cardiac etiology given the clinical picture.  She will be discharged with prednisone and prescribed a nebulizer with albuterol solution.  She is to return as needed if she worsens.  Final Clinical Impressions(s) / ED Diagnoses   Final diagnoses:  None    ED Discharge Orders    None       Veryl Speak, MD 10/03/17 [phone]

## 2017-10-03 NOTE — Discharge Instructions (Signed)
Prednisone as prescribed.  Albuterol nebulizer treatment every 4 hours as needed for wheezing or shortness of breath.  Follow-up with your pulmonologist next week for a recheck, and return to the ER if symptoms significantly worsen or change.

## 2017-10-04 ENCOUNTER — Telehealth: Payer: Self-pay | Admitting: Internal Medicine

## 2017-10-04 NOTE — Telephone Encounter (Signed)
I suggest she go ahead with meds as directed by ER. We will be considering this as the lab results and sputum cultures become available.

## 2017-10-04 NOTE — Telephone Encounter (Signed)
Pt is aware and voiced her understanding.  Nothing further is needed.  

## 2017-10-04 NOTE — Telephone Encounter (Signed)
Pt has ED visit 10/03/17 for Asthma flare. Pt was prescribed prednisone and albuterol neb solution.  Pt is requesting CY's recommendations before starting prednisone, as she has had 3 rounds of abx in the past 3 months.  Pt reports of increased sob, chest tightness, prod cough with light brown mucus & wheezing. symptoms have been ongoing since Thanksgiving but have worsened over the past 2 weeks.  Denies fever, chills, sweats or body aches.   Current Outpatient Medications on File Prior to Visit  Medication Sig Dispense Refill  . acyclovir ointment (ZOVIRAX) 5 % Apply 1 application topically every 3 (three) hours. 15 g 5  . albuterol (PROVENTIL) (2.5 MG/3ML) 0.083% nebulizer solution Take 3 mLs (2.5 mg total) by nebulization every 4 (four) hours as needed for wheezing or shortness of breath. 30 vial 0  . budesonide-formoterol (SYMBICORT) 160-4.5 MCG/ACT inhaler Inhale 2 puffs into the lungs 2 (two) times daily. 1 Inhaler 12  . doxycycline (VIBRA-TABS) 100 MG tablet Take 1 tablet (100 mg total) by mouth 2 (two) times daily. 28 tablet 0  . escitalopram (LEXAPRO) 5 MG tablet Take 1 tablet (5 mg total) by mouth daily. 90 tablet 3  . Glycopyrrolate-Formoterol (BEVESPI AEROSPHERE) 9-4.8 MCG/ACT AERO Inhale 2 puffs into the lungs 2 (two) times daily. 1 Inhaler 0  . hydrochlorothiazide (HYDRODIURIL) 25 MG tablet Take 1 tablet (25 mg total) by mouth daily. 90 tablet 3  . L-Lysine 500 MG TABS Take by mouth as needed (for fever blisters).     . montelukast (SINGULAIR) 10 MG tablet Take 1 tablet (10 mg total) by mouth at bedtime. 30 tablet 11  . predniSONE (DELTASONE) 10 MG tablet Take 2 tablets (20 mg total) by mouth 2 (two) times daily with a meal. 20 tablet 0  . Sodium Chloride-Sodium Bicarb (NETI POT SINUS [place] NA) Place into the nose.    . valACYclovir (VALTREX) 1000 MG tablet 2 tabs po Q 12 hrs x 1 day for fever blister (Patient taking differently: 2 tabs po Q 12 hrs x 1 day for fever blister  AS  NEEDED) 12 tablet 5   No current facility-administered medications on file prior to visit.     Allergies  Allergen Reactions  . Tetanus Toxoid     Eyelid edema FROM HORSES; therefore , she was told she could not take Tetanus shot.She has had tetanus shot w/o adverse effect since 2002

## 2017-10-05 DIAGNOSIS — J45909 Unspecified asthma, uncomplicated: Secondary | ICD-10-CM | POA: Diagnosis not present

## 2017-10-14 ENCOUNTER — Other Ambulatory Visit: Payer: Medicare Other

## 2017-10-14 ENCOUNTER — Ambulatory Visit: Payer: Medicare Other | Admitting: Internal Medicine

## 2017-10-14 ENCOUNTER — Encounter: Payer: Self-pay | Admitting: Internal Medicine

## 2017-10-14 VITALS — BP 118/78 | HR 78 | Ht 64.0 in | Wt 128.0 lb

## 2017-10-14 DIAGNOSIS — J4541 Moderate persistent asthma with (acute) exacerbation: Secondary | ICD-10-CM

## 2017-10-14 MED ORDER — HYDROCOD POLST-CPM POLST ER 10-8 MG/5ML PO SUER: 5.0000 mL | Freq: Two times a day (BID) | ORAL | 0 refills | Status: DC | PRN

## 2017-10-14 MED ORDER — LEVALBUTEROL HCL 0.63 MG/3ML IN NEBU
0.6300 mg | INHALATION_SOLUTION | Freq: Once | RESPIRATORY_TRACT | Status: AC
  Administered 2017-10-14: 0.63 mg via RESPIRATORY_TRACT

## 2017-10-14 MED ORDER — GLYCOPYRROLATE-FORMOTEROL 9-4.8 MCG/ACT IN AERO: 2.0000 | INHALATION_SPRAY | Freq: Two times a day (BID) | RESPIRATORY_TRACT | 0 refills | Status: DC

## 2017-10-14 MED ORDER — METHYLPREDNISOLONE ACETATE 80 MG/ML IJ SUSP
80.0000 mg | Freq: Once | INTRAMUSCULAR | Status: AC
  Administered 2017-10-14: 80 mg via INTRAMUSCULAR

## 2017-10-14 MED ORDER — GLYCOPYRROLATE-FORMOTEROL 9-4.8 MCG/ACT IN AERO: 2.0000 | INHALATION_SPRAY | Freq: Two times a day (BID) | RESPIRATORY_TRACT | 12 refills | Status: DC

## 2017-10-14 NOTE — Progress Notes (Signed)
F never smoker  Asthmatic bronchitis; constant cough per patient. Silent GERD PFT 10/06/2007- WNL. FVC 2.66/ 88%, FEV1 2.14/ 98%, FEV1/FVC 0.8, TLC 105%, DLCO 98% Allergy Profile 08/01/13- Total IgE 151.4, Broad elevations for many common environmental allergens FENO 05/21/16- 48= elevation consistent with allergic component to asthma ---------------------------------------------  09/28/17-  77 year old female never smoker followed for asthmatic bronchitis, chronic cough, allergic rhinitis complicated by GERD --- Pt continues to have increased SOB with cough-hard and dark yellow to light brown in color-has gotten worse. Denies any fever or chills. 1 dose of Doxycyline left.  Ventolin HFA, Singulair, Symbicort 160 Completed Omnicef then doxycycline.  2 other antibiotics earlier this winter, after sustained cough pattern began around Thanksgiving.  Sinus pressure sensation has improved.  Denies reflux symptoms but remembers that aggressive reflux control measures stopped protracted coughing some years ago.  Cough is worse at night.  She is going to start sleeping propped up again.  Cough productive of dark yellow without fever. CT sinus 09/20/17- IMPRESSION: 1. Mild mucosal thickening along the floor of the right maxillary sinus. 2. No acute fluid levels.  10/14/17-  77 year old female never smoker followed for asthmatic bronchitis, chronic cough, allergic rhinitis complicated by GERD ----Chronic bronchitis: Pt had to go to ER since last OV-has neb machine now; Pt has trouble with breathing and states she is no longer to talk much and be as active as she was previously due to breathing issues. Coughs for 15-20 minutes at a time. Feels like she is unable to go many places due to this. Pt continues to have colored phelgm.  Neb albuterol Carefully avoiding all reflux triggers as instructed.  Thinks cough is worse now with some wheezing.  Sputum white or "caramel" without blood.  No fever.  Nebulizer helps her  shortness of breath.  No time of day pattern.  Trelegy did not help.  She continues Bevespi and Ventolin.  ROS-see HPI + = positive Constitutional:   No-   weight loss, night sweats, fevers, chills, fatigue, lassitude. HEENT:  +  headaches, difficulty swallowing, tooth/dental problems, sore throat,       No-  sneezing, itching, ear ache, +nasal congestion, +post nasal drip,  CV:  No-   chest pain, orthopnea, PND, swelling in lower extremities, anasarca,                                                         dizziness, palpitations Resp:   shortness of breath with exertion or at rest.             Sign productive cough,   non-productive cough,  No- coughing up of blood.             +   change in color of mucus.  No- wheezing.   Skin: No-   rash or lesions. GI:  No- heartburn, indigestion, abdominal pain, nausea, vomiting,  GU:  MS:  No-   joint pain or swelling.   Neuro-     nothing unusual Psych:  No- change in mood or affect. No depression +anxiety.  No memory loss.  OBJ- Physical Exam General- Alert, Oriented, Affect-appropriate, Distress- none acute, trim, alert Skin- rash-none, lesions- none, excoriation- none   Lymphadenopathy- none Head- atraumatic            Eyes- Gross vision  intact, PERRLA, conjunctivae and secretions clear            Ears- Hearing, canals-normal,             Nose- + turbinate edema, no-Septal dev, mucus, polyps, erosion, perforation             Throat- Mallampati II , mucosa clear , drainage- none, tonsils- atrophic Neck- flexible , trachea midline, no stridor , thyroid nl, carotid no bruit Chest - symmetrical excursion , unlabored           Heart/CV- RRR , no murmur , no gallop  , no rub, nl s1 s2                           - JVD- none , edema- none, stasis changes- none, varices- none           Lung-  wheeze-none , cough+ persistent, dullness-none, rub- none,            Chest wall-  Abd-  Br/ Gen/ Rectal- Not done, not indicated Extrem- cyanosis- none,  clubbing, none, atrophy- none, strength- nl Neuro- grossly intact to observation

## 2017-10-14 NOTE — Patient Instructions (Addendum)
Neb xop 0.63      Dx asthmatic bronchitis exacerbation  Depo 80  Script for Tussionex  Cough syrup  Sample Bevespi    Inhale 2 purffs twice daily  Lab- IgE

## 2017-10-15 LAB — IGE: IgE (Immunoglobulin E), Serum: 126 kU/L — ABNORMAL HIGH (ref <=114)

## 2017-10-18 NOTE — Assessment & Plan Note (Signed)
Sustained asthmatic bronchitis has not responded to several antibiotics, at least one round of prednisone.  She may be a candidate for Xolair. Plan-Depo-Medrol, nebulizer treatment Xopenex, Tussionex, lab for total IgE

## 2017-10-19 ENCOUNTER — Telehealth: Payer: Self-pay | Admitting: Internal Medicine

## 2017-10-19 NOTE — Telephone Encounter (Signed)
Called and spoke with patient, advised her of results. She is aware and verbalized understanding. Patient would like to start this.    Routing to TS to get this started.

## 2017-10-20 ENCOUNTER — Telehealth: Payer: Self-pay | Admitting: Internal Medicine

## 2017-10-20 NOTE — Telephone Encounter (Signed)
Pt came by the office today and completed her portion of forms for Xolair.

## 2017-10-20 NOTE — Telephone Encounter (Signed)
See previous message

## 2017-10-21 ENCOUNTER — Telehealth: Payer: Self-pay | Admitting: Internal Medicine

## 2017-10-21 NOTE — Telephone Encounter (Signed)
Called and spoke with patient regarding increase of asthma symptoms Pt reports worsening wheezing, productive cough-green, SOB, and chest tightness Pt states that these symptoms are bothering her life quality and daily tasks. Pt is requesting to see CY today; no available appts until 11/19/2017 Pt is requesting to talk with CY more in depth about Xolair injections  CY please advise  Current Outpatient Medications on File Prior to Visit  Medication Sig Dispense Refill  . acyclovir ointment (ZOVIRAX) 5 % Apply 1 application topically every 3 (three) hours. 15 g 5  . albuterol (PROVENTIL) (2.5 MG/3ML) 0.083% nebulizer solution Take 3 mLs (2.5 mg total) by nebulization every 4 (four) hours as needed for wheezing or shortness of breath. 30 vial 0  . budesonide-formoterol (SYMBICORT) 160-4.5 MCG/ACT inhaler Inhale 2 puffs into the lungs 2 (two) times daily. 1 Inhaler 12  . chlorpheniramine-HYDROcodone (TUSSIONEX PENNKINETIC ER) 10-8 MG/5ML SUER Take 5 mLs by mouth every 12 (twelve) hours as needed for cough. 200 mL 0  . escitalopram (LEXAPRO) 5 MG tablet Take 1 tablet (5 mg total) by mouth daily. 90 tablet 3  . Glycopyrrolate-Formoterol (BEVESPI AEROSPHERE) 9-4.8 MCG/ACT AERO Inhale 2 puffs into the lungs 2 (two) times daily. 1 Inhaler 12  . Glycopyrrolate-Formoterol (BEVESPI AEROSPHERE) 9-4.8 MCG/ACT AERO Inhale 2 puffs into the lungs 2 (two) times daily. 1 Inhaler 0  . hydrochlorothiazide (HYDRODIURIL) 25 MG tablet Take 1 tablet (25 mg total) by mouth daily. 90 tablet 3  . L-Lysine 500 MG TABS Take by mouth as needed (for fever blisters).     . montelukast (SINGULAIR) 10 MG tablet Take 1 tablet (10 mg total) by mouth at bedtime. 30 tablet 11  . Sodium Chloride-Sodium Bicarb (NETI POT SINUS [place] NA) Place into the nose.    . valACYclovir (VALTREX) 1000 MG tablet 2 tabs po Q 12 hrs x 1 day for fever blister (Patient taking differently: 2 tabs po Q 12 hrs x 1 day for fever blister  AS NEEDED) 12  tablet 5   No current facility-administered medications on file prior to visit.    Allergies  Allergen Reactions  . Tetanus Toxoid     Eyelid edema FROM HORSES; therefore , she was told she could not take Tetanus shot.She has had tetanus shot w/o adverse effect since 2002

## 2017-10-21 NOTE — Telephone Encounter (Signed)
We are working in [employer]ural [employer]

## 2017-10-22 ENCOUNTER — Ambulatory Visit: Payer: Medicare Other | Admitting: Internal Medicine

## 2017-10-22 ENCOUNTER — Encounter: Payer: Self-pay | Admitting: Internal Medicine

## 2017-10-22 VITALS — BP 124/68 | HR 77 | Ht 64.0 in | Wt 125.8 lb

## 2017-10-22 DIAGNOSIS — J4541 Moderate persistent asthma with (acute) exacerbation: Secondary | ICD-10-CM

## 2017-10-22 DIAGNOSIS — J3089 Other allergic rhinitis: Secondary | ICD-10-CM | POA: Diagnosis not present

## 2017-10-22 DIAGNOSIS — J42 Unspecified chronic bronchitis: Secondary | ICD-10-CM | POA: Diagnosis not present

## 2017-10-22 DIAGNOSIS — J302 Other seasonal allergic rhinitis: Secondary | ICD-10-CM | POA: Diagnosis not present

## 2017-10-22 MED ORDER — LEVOFLOXACIN 500 MG PO TABS: 500.0000 mg | ORAL_TABLET | Freq: Every day | ORAL | 0 refills | Status: DC

## 2017-10-22 NOTE — Patient Instructions (Addendum)
Order- lab- Sputum C&S- routine, fungal, AFB     Dx chronic bronchitis  Script sent for levaquin antibiotic  We have discussed Xolair injections as an option  Other options we have discussed-  [facility]  Drs Neldon Mc, [place] and associates.  They might consider any of these options, including skin testing and restarting allergy shots                                                        - Referral to a University, eg [facility]

## 2017-10-22 NOTE — Progress Notes (Signed)
F never smoker  Asthmatic bronchitis; constant cough per patient. Silent GERD PFT 10/06/2007- WNL. FVC 2.66/ 88%, FEV1 2.14/ 98%, FEV1/FVC 0.8, TLC 105%, DLCO 98% Allergy Profile 08/01/13- Total IgE 151.4, Broad elevations for many common environmental allergens FENO 05/21/16- 48= elevation consistent with allergic component to asthma ---------------------------------------------  10/14/17-  77 year old female never smoker followed for asthmatic bronchitis, chronic cough, allergic rhinitis complicated by GERD ----Chronic bronchitis: Pt had to go to ER since last OV-has neb machine now; Pt has trouble with breathing and states she is no longer to talk much and be as active as she was previously due to breathing issues. Coughs for 15-20 minutes at a time. Feels like she is unable to go many places due to this. Pt continues to have colored phelgm.  Neb albuterol Carefully avoiding all reflux triggers as instructed.  Thinks cough is worse now with some wheezing.  Sputum white or "caramel" without blood.  No fever.  Nebulizer helps her shortness of breath.  No time of day pattern.  Trelegy did not help.  She continues Bevespi and Ventolin.  10/22/17- 77 year old female never smoker followed for asthmatic bronchitis, chronic cough, allergic rhinitis complicated by GERD ----productive cough -tan, white, or green, wheezing, minimal chest tightness, SOB with coughing and exertion no fever CBC 10/01/17- EOS 11.2 Sputum cultures 10/01/17-normal flora, negative AFB, negative fungal Total IgE 10/14/17- 126   Weight 125 lbs Comes with her husband today to discuss Xolair and other options.  Despite negative recent cultures she again describes sputum as green/tan more postnasal drip.  No fever.  Had taken doxycycline, Z-Pak, Omnicef, Augmentin.  She thinks she did better in the past when she was on allergy vaccine and we discussed seeing an allergy office.  Also discussed University referral.  ROS-see HPI + =  positive Constitutional:   No-   weight loss, night sweats, fevers, chills, fatigue, lassitude. HEENT:  +  headaches, difficulty swallowing, tooth/dental problems, sore throat,       No-  sneezing, itching, ear ache, +nasal congestion, +post nasal drip,  CV:  No-   chest pain, orthopnea, PND, swelling in lower extremities, anasarca,                                                         dizziness, palpitations Resp:   shortness of breath with exertion or at rest.             + productive cough,   non-productive cough,  No- coughing up of blood.             +   change in color of mucus.  No- wheezing.   Skin: No-   rash or lesions. GI:  No- heartburn, indigestion, abdominal pain, nausea, vomiting,  GU:  MS:  No-   joint pain or swelling.   Neuro-     nothing unusual Psych:  No- change in mood or affect. No depression +anxiety.  No memory loss.  OBJ- Physical Exam General- Alert, Oriented, Affect-appropriate, Distress- none acute, trim, alert Skin- rash-none, lesions- none, excoriation- none   Lymphadenopathy- none Head- atraumatic            Eyes- Gross vision intact, PERRLA, conjunctivae and secretions clear            Ears- Hearing, canals-normal,  Nose- + turbinate edema, no-Septal dev, mucus, polyps, erosion, perforation             Throat- Mallampati II , mucosa clear , drainage- none, tonsils- atrophic Neck- flexible , trachea midline, no stridor , thyroid nl, carotid no bruit Chest - symmetrical excursion , unlabored           Heart/CV- RRR , no murmur , no gallop  , no rub, nl s1 s2                           - JVD- none , edema- none, stasis changes- none, varices- none           Lung-  wheeze-none , cough+ persistent, dullness-none, rub- none,            Chest wall-  Abd-  Br/ Gen/ Rectal- Not done, not indicated Extrem- cyanosis- none, clubbing, none, atrophy- none, strength- nl Neuro- grossly intact to observation

## 2017-10-24 NOTE — Assessment & Plan Note (Signed)
She thinks allergy vaccine help her in the past.  I have offered referral.

## 2017-10-24 NOTE — Assessment & Plan Note (Signed)
Stained exacerbation has really persisted since November and she describes it as worse recently.  She is being aggressive about reflux precautions. Plan-Xolair or 1 of the other Biologics is suggested by persistent symptoms, elevated IgE and eosinophil counts.  I offered to refer her to an allergy office if she wanted to explore going back on allergy vaccine.  I offered referral to [facility] pulmonary/allergy.  How she considers these options, she was in favor of repeating sputum cultures and trying once more with an antibiotic so we are giving Levaquin.  Side effect potential discussed.

## 2017-10-25 NOTE — Telephone Encounter (Signed)
Attempted to call patient, no answer, message left to call back.  

## 2017-10-25 NOTE — Telephone Encounter (Signed)
Patient returning call, CB [phone].

## 2017-10-25 NOTE — Telephone Encounter (Signed)
Patient called regarding medication she received in the ED. It was albuterol sulfate 0.083% for her nebulizer - pt would like rx to be sent to [facility]. Patient can be reached at [phone]

## 2017-10-26 MED ORDER — ALBUTEROL SULFATE (2.5 MG/3ML) 0.083% IN NEBU: 2.5000 mg | INHALATION_SOLUTION | Freq: Four times a day (QID) | RESPIRATORY_TRACT | 12 refills | Status: DC | PRN

## 2017-10-26 NOTE — Telephone Encounter (Signed)
Called and spoke to patient. Patient reports she has the nebulizer machine but needs more medication. Patient asked for Rx to be sent to [facility]. Reviewed with patient the medication and frequency of use. Patient verbalized understand. Rx sent to pharmacy. Nothing further is needed at this time.

## 2017-10-26 NOTE — Telephone Encounter (Signed)
Called and spoke to patient. Patient stated when she went to the emergency room that she was prescribed albuterol neb and that this has been helping. Patient is requesting that Dr. Annamaria Boots take a look at her medication and possibly prescribe albuterol neb. Patient reports she has her albuterol rescue inhaler and using the Bevespi inhaler.   CY please advise.    Current Outpatient Medications on File Prior to Visit  Medication Sig Dispense Refill  . acyclovir ointment (ZOVIRAX) 5 % Apply 1 application topically every 3 (three) hours. 15 g 5  . albuterol (PROVENTIL) (2.5 MG/3ML) 0.083% nebulizer solution Take 3 mLs (2.5 mg total) by nebulization every 4 (four) hours as needed for wheezing or shortness of breath. 30 vial 0  . budesonide-formoterol (SYMBICORT) 160-4.5 MCG/ACT inhaler Inhale 2 puffs into the lungs 2 (two) times daily. (Patient not taking: Reported on 10/22/2017) 1 Inhaler 12  . chlorpheniramine-HYDROcodone (TUSSIONEX PENNKINETIC ER) 10-8 MG/5ML SUER Take 5 mLs by mouth every 12 (twelve) hours as needed for cough. 200 mL 0  . escitalopram (LEXAPRO) 5 MG tablet Take 1 tablet (5 mg total) by mouth daily. 90 tablet 3  . Glycopyrrolate-Formoterol (BEVESPI AEROSPHERE) 9-4.8 MCG/ACT AERO Inhale 2 puffs into the lungs 2 (two) times daily. 1 Inhaler 12  . Glycopyrrolate-Formoterol (BEVESPI AEROSPHERE) 9-4.8 MCG/ACT AERO Inhale 2 puffs into the lungs 2 (two) times daily. 1 Inhaler 0  . hydrochlorothiazide (HYDRODIURIL) 25 MG tablet Take 1 tablet (25 mg total) by mouth daily. 90 tablet 3  . L-Lysine 500 MG TABS Take by mouth as needed (for fever blisters).     . montelukast (SINGULAIR) 10 MG tablet Take 1 tablet (10 mg total) by mouth at bedtime. 30 tablet 11  . Sodium Chloride-Sodium Bicarb (NETI POT SINUS [place] NA) Place into the nose.    . valACYclovir (VALTREX) 1000 MG tablet 2 tabs po Q 12 hrs x 1 day for fever blister (Patient not taking: Reported on 10/22/2017) 12 tablet 5   No current  facility-administered medications on file prior to visit.    Allergies  Allergen Reactions  . Tetanus Toxoid     Eyelid edema FROM HORSES; therefore , she was told she could not take Tetanus shot.She has had tetanus shot w/o adverse effect since 2002

## 2017-10-26 NOTE — Telephone Encounter (Signed)
Pt is returning call. Pt would like to know if CY would like to keep her on the Neb Solution, Proventil, that she was prescribed while in the ED or if CY would like to try a different Neb. Solution. Pt requesting a Rx be sent in to [facility]. Cb is [phone].

## 2017-10-26 NOTE — Telephone Encounter (Signed)
Ok to use Proventil (albuterol) neb solution. If she needs Rx, ok to Rx 150 ml. 1 neb every 6 hours as needed, refill x 12  Does she already have a compressor?

## 2017-10-29 ENCOUNTER — Telehealth: Payer: Self-pay | Admitting: Internal Medicine

## 2017-10-29 NOTE — Telephone Encounter (Signed)
Spoke with patient for over 15 minutes. She stated that she wishes to proceed with the Xolair injections. She was under the impression that [name] was working on this this week.   She will also have her husband to come by the office to get another sputum cup since she did the first test incorrectly. She collected a weeks worth of sputum.   Will route this to [facility] for follow up on Xolair. Patient stated that she has filled out her portion of paperwork./

## 2017-10-29 NOTE — Telephone Encounter (Signed)
If she wanted traditional allergy shots, then she needs referral to [facility] on [address] (Drs Neldon Mc, [place], et al)   If she wanted to go ahead with our recommendation to start Xolair shots here, then we will start that application process through [employer] and [employer].

## 2017-10-29 NOTE — Telephone Encounter (Signed)
Patient calling back - requesting to speak to Raquel Sarna [phone]

## 2017-10-29 NOTE — Telephone Encounter (Signed)
Called and spoke with pt who was wanting to know where she stood with getting process ready for her to begin injections again.  Stated to pt based on her last OV with CY, the current plan was stated:  Instructions   Order- lab- Sputum C&S- routine, fungal, AFB     Dx chronic bronchitis  Script sent for levaquin antibiotic  We have discussed Xolair injections as an option  Other options we have discussed-  [facility]  Drs Neldon Mc, [place] and associates.  They might consider any of these options, including skin testing and restarting allergy shots                                                        - Referral to a University, eg [facility]     Pt stated to me her and her husband Joneen Boers misunderstood the instructions at the [facility] and thought that the process to begin injections was going to be taking place after the OV.  Pt stated to me she finished the abx today, 10/29/17 and she thinks she is no better since OV if not worse. She was having a real hard time trying to talk to me on the phone and had to end conversation due to SOB.  Pt stated to me she has been able to do the sputum culture and stated her husband was going to bring it to the lab today.  Stated to pt I would check with CY to see what route we should go for pt.  Dr. Annamaria Boots, please advise on the above. Thanks!

## 2017-11-01 NOTE — Telephone Encounter (Signed)
Spoke with pt. To let her know her xolair enrollment form was fax to [employer]. I will call her when I hear from them.

## 2017-11-01 NOTE — Telephone Encounter (Signed)
Pt is calling [name] back [phone]

## 2017-11-01 NOTE — Telephone Encounter (Signed)
Returned patient's call. Patient stated that she needs to talk to [employer] about the [place] and insurance.   Tammy please give patient a call. Thanks!

## 2017-11-01 NOTE — Telephone Encounter (Signed)
Xolair new starts go through [employer] forward message to her to provide updates to the patient.

## 2017-11-02 ENCOUNTER — Other Ambulatory Visit: Payer: Medicare Other

## 2017-11-02 DIAGNOSIS — J42 Unspecified chronic bronchitis: Secondary | ICD-10-CM

## 2017-11-02 NOTE — Telephone Encounter (Signed)
Recd fax from [employer], wanted to know if the patient would be buy and bill or specialty pharmacy.  Indicated the patient will be buy and bill and faxed back to [employer].

## 2017-11-03 ENCOUNTER — Telehealth: Payer: Self-pay | Admitting: Internal Medicine

## 2017-11-03 NOTE — Telephone Encounter (Signed)
Letter has been given to [facility] to give to Dr. Annamaria Boots.

## 2017-11-04 ENCOUNTER — Encounter: Payer: Self-pay | Admitting: Internal Medicine

## 2017-11-04 ENCOUNTER — Other Ambulatory Visit (INDEPENDENT_AMBULATORY_CARE_PROVIDER_SITE_OTHER): Payer: Medicare Other

## 2017-11-04 ENCOUNTER — Telehealth: Payer: Self-pay | Admitting: Internal Medicine

## 2017-11-04 ENCOUNTER — Ambulatory Visit: Payer: Medicare Other | Admitting: Internal Medicine

## 2017-11-04 VITALS — BP 120/74 | HR 67 | Ht 64.0 in | Wt 122.8 lb

## 2017-11-04 DIAGNOSIS — J4541 Moderate persistent asthma with (acute) exacerbation: Secondary | ICD-10-CM | POA: Diagnosis not present

## 2017-11-04 DIAGNOSIS — J4550 Severe persistent asthma, uncomplicated: Secondary | ICD-10-CM | POA: Diagnosis not present

## 2017-11-04 DIAGNOSIS — J471 Bronchiectasis with (acute) exacerbation: Secondary | ICD-10-CM

## 2017-11-04 LAB — CBC WITH DIFFERENTIAL/PLATELET
BASOS ABS: 0.1 10*3/uL (ref 0.0–0.1)
Basophils Relative: 1.3 % (ref 0.0–3.0)
Eosinophils Absolute: 0.9 10*3/uL — ABNORMAL HIGH (ref 0.0–0.7)
Eosinophils Relative: 13.6 % — ABNORMAL HIGH (ref 0.0–5.0)
HEMATOCRIT: 39.6 % (ref 36.0–46.0)
HEMOGLOBIN: 13.5 g/dL (ref 12.0–15.0)
LYMPHS PCT: 26.8 % (ref 12.0–46.0)
Lymphs Abs: 1.9 10*3/uL (ref 0.7–4.0)
MCHC: 34.2 g/dL (ref 30.0–36.0)
MCV: 91.2 fl (ref 78.0–100.0)
Monocytes Absolute: 0.5 10*3/uL (ref 0.1–1.0)
Monocytes Relative: 7.1 % (ref 3.0–12.0)
NEUTROS PCT: 51.2 % (ref 43.0–77.0)
Neutro Abs: 3.6 10*3/uL (ref 1.4–7.7)
Platelets: 321 10*3/uL (ref 150.0–400.0)
RBC: 4.34 Mil/uL (ref 3.87–5.11)
RDW: 12.6 % (ref 11.5–15.5)
WBC: 7 10*3/uL (ref 4.0–10.5)

## 2017-11-04 LAB — SEDIMENTATION RATE: Sed Rate: 21 mm/hr (ref 0–30)

## 2017-11-04 MED ORDER — HYDROCOD POLST-CPM POLST ER 10-8 MG/5ML PO SUER: 5.0000 mL | Freq: Two times a day (BID) | ORAL | 0 refills | Status: DC | PRN

## 2017-11-04 MED ORDER — PREDNISONE 10 MG PO TABS
ORAL_TABLET | ORAL | 0 refills | Status: DC
Start: 2017-11-04 — End: 2018-03-09

## 2017-11-04 NOTE — Telephone Encounter (Signed)
Spoke with [name] she said she would take care of this Friday. Routing to [employer].

## 2017-11-04 NOTE — Telephone Encounter (Signed)
Patient requesting to speak to [name] about her Xolair, this is the only information she would give me.  CB is [phone].

## 2017-11-04 NOTE — Telephone Encounter (Signed)
Patient is calling because she has a question about application for Xolair and would like a call back. CB number is [phone]

## 2017-11-04 NOTE — Progress Notes (Signed)
F never smoker  Asthmatic bronchitis; constant cough per patient. Silent GERD PFT 10/06/2007- WNL. FVC 2.66/ 88%, FEV1 2.14/ 98%, FEV1/FVC 0.8, TLC 105%, DLCO 98% Allergy Profile 08/01/13- Total IgE 151.4, Broad elevations for many common environmental allergens FENO 05/21/16- 48= elevation consistent with allergic component to asthma ---------------------------------------------  10/22/17- 77 year old female never smoker followed for asthmatic bronchitis, chronic cough, allergic rhinitis complicated by GERD ----productive cough -tan, white, or green, wheezing, minimal chest tightness, SOB with coughing and exertion no fever CBC 10/01/17- EOS 11.2 Sputum cultures 10/01/17-normal flora, negative AFB, negative fungal Total IgE 10/14/17- 126   Weight 125 lbs Comes with her husband today to discuss Xolair and other options.  Despite negative recent cultures she again describes sputum as green/tan more postnasal drip.  No fever.  Had taken doxycycline, Z-Pak, Omnicef, Augmentin.  She thinks she did better in the past when she was on allergy vaccine and we discussed seeing an allergy office.  Also discussed University referral.  11/04/2017- 77 year old female never smoker followed for asthmatic bronchitis, chronic cough, allergic rhinitis complicated by GERD ----Asthma; Pt would like to discuss referral to [facility] and continues to have cough. Paperwork is in place to start Xolair next week.  Waiting to hear about grant money. ----Asthma; Pt would like to discuss referral to [facility] and continues to have cough. Repeat sputum cultures from 11/02/2017 are all pending Singulair, Bevespi, neb albuterol    finished Levaquin She says despite my instruction, she was on Levaquin when she submitted her last sputum culture.  We can try once more.  She reports cough is worse despite Levaquin.  She has lost some weight "no energy to eat".  Wakes coughing and not sleeping well.  Up a lot coughing. Had failed to improve with  Symbicort and Trelegy.  Cough is productive now of plugs and casts.  Denies fever, rash, adenopathy, joint pain.  No significant sinus involvement. I had previously offered [facility] referral.  A contact of hers has recommended Dr. Magda Kiel at [facility] pulmonary so we will get that referral started. CT MaxFac 09/20/17 1. Mild mucosal thickening along the floor of the right maxillary sinus. 2. No acute fluid levels.  ROS-see HPI + = positive Constitutional:   + weight loss, night sweats, fevers, chills, +fatigue, lassitude. HEENT:  +  headaches, difficulty swallowing, tooth/dental problems, sore throat,       No-  sneezing, itching, ear ache, nasal congestion, +post nasal drip,  CV:  No-   chest pain, orthopnea, PND, swelling in lower extremities, anasarca,                                                    dizziness, palpitations Resp:   shortness of breath with exertion or at rest.             + productive cough,   non-productive cough,  No- coughing up of blood.             +   change in color of mucus.  No- wheezing.   Skin: No-   rash or lesions. GI:  No- heartburn, indigestion, abdominal pain, nausea, vomiting,  GU:  MS:  No-   joint pain or swelling.   Neuro-     nothing unusual Psych:  No- change in mood or affect. No depression +anxiety.  No memory loss.  OBJ- Physical Exam General- Alert, Oriented, Affect-appropriate, Distress- none acute, trim, alert Skin- rash-none, lesions- none, excoriation- none   Lymphadenopathy- none Head- atraumatic            Eyes- Gross vision intact, PERRLA, conjunctivae and secretions clear            Ears- Hearing, canals-normal,             Nose- + turbinate edema, no-Septal dev, mucus, polyps, erosion, perforation             Throat- Mallampati II , mucosa clear , drainage- none, tonsils- atrophic Neck- flexible , trachea midline, no stridor , thyroid nl, carotid no bruit Chest - symmetrical excursion , unlabored           Heart/CV- RRR , no murmur  , no gallop  , no rub, nl s1 s2                           - JVD- none , edema- none, stasis changes- none, varices- none           Lung-  wheeze-none , cough+active,  persistent, dullness-none, rub- none,            Chest wall-  Abd-  Br/ Gen/ Rectal- Not done, not indicated Extrem- cyanosis- none, clubbing, none, atrophy- none, strength- nl Neuro- grossly intact to observation

## 2017-11-04 NOTE — Telephone Encounter (Signed)
CY has letter and will be seeing patient at 11:30am today. Will take care of referral request then.

## 2017-11-04 NOTE — Telephone Encounter (Signed)
Ok

## 2017-11-04 NOTE — Telephone Encounter (Signed)
Spoke with [name] letting her know pt was calling back with questions about application.  Per [name], route message to [employer] and then the two of them would work on this for pt.  Routing to [employer].

## 2017-11-04 NOTE — Telephone Encounter (Signed)
Spoke with patient-she will be here at 11:30am to see CY. Nothing more needed at this time.

## 2017-11-04 NOTE — Telephone Encounter (Signed)
Called and spoke to pt.  Pt is requesting an apt with CY today. Pt reports of prod cough with brown to white mucus, increased sob, increased weakness x51mo. Pt states she has lost 5-6lbs in the 2-3 weeks, as she is unable to eat due to sob. Pt states she is taking albuterol neb solution q8h with mild improvement.  Denied fever, chills, sweats or body aches.  CY please advise. Thanks  Current Outpatient Medications on File Prior to Visit  Medication Sig Dispense Refill  . acyclovir ointment (ZOVIRAX) 5 % Apply 1 application topically every 3 (three) hours. 15 g 5  . albuterol (PROVENTIL) (2.5 MG/3ML) 0.083% nebulizer solution Take 3 mLs (2.5 mg total) by nebulization every 6 (six) hours as needed for wheezing or shortness of breath. 150 mL 12  . budesonide-formoterol (SYMBICORT) 160-4.5 MCG/ACT inhaler Inhale 2 puffs into the lungs 2 (two) times daily. (Patient not taking: Reported on 10/22/2017) 1 Inhaler 12  . chlorpheniramine-HYDROcodone (TUSSIONEX PENNKINETIC ER) 10-8 MG/5ML SUER Take 5 mLs by mouth every 12 (twelve) hours as needed for cough. 200 mL 0  . escitalopram (LEXAPRO) 5 MG tablet Take 1 tablet (5 mg total) by mouth daily. 90 tablet 3  . Glycopyrrolate-Formoterol (BEVESPI AEROSPHERE) 9-4.8 MCG/ACT AERO Inhale 2 puffs into the lungs 2 (two) times daily. 1 Inhaler 12  . Glycopyrrolate-Formoterol (BEVESPI AEROSPHERE) 9-4.8 MCG/ACT AERO Inhale 2 puffs into the lungs 2 (two) times daily. 1 Inhaler 0  . hydrochlorothiazide (HYDRODIURIL) 25 MG tablet Take 1 tablet (25 mg total) by mouth daily. 90 tablet 3  . L-Lysine 500 MG TABS Take by mouth as needed (for fever blisters).     Marland Kitchen levofloxacin (LEVAQUIN) 500 MG tablet Take 1 tablet (500 mg total) by mouth daily. 7 tablet 0  . montelukast (SINGULAIR) 10 MG tablet Take 1 tablet (10 mg total) by mouth at bedtime. 30 tablet 11  . Sodium Chloride-Sodium Bicarb (NETI POT SINUS [place] NA) Place into the nose.    . valACYclovir (VALTREX) 1000 MG tablet 2  tabs po Q 12 hrs x 1 day for fever blister (Patient not taking: Reported on 10/22/2017) 12 tablet 5   No current facility-administered medications on file prior to visit.     Allergies  Allergen Reactions  . Tetanus Toxoid     Eyelid edema FROM HORSES; therefore , she was told she could not take Tetanus shot.She has had tetanus shot w/o adverse effect since 2002

## 2017-11-04 NOTE — Patient Instructions (Addendum)
Order- schedule CT chest hi resolution, no contrast    Dx bronchiectasis exacerbation  Order- lab- ANCA, sed rate, ANA, CBC w diff, Complement( C3, C4, CH50)  Order- referral to Dr Evie Lacks, [facility]   Asthmatic bronchitis, severe persistent  Order- lab-  Sputum C&S routine  Script prednisone 10 mg tabs-  Take 2 daily x 2 weeks  Order- schedule bone density assay- routine post menopausal  Script printed Tussionex

## 2017-11-04 NOTE — Telephone Encounter (Signed)
Spoke with Susan Chavez, she states she didn't call the num,ber yet to see if she could get assistance but she will and let [name] know what they said. I will leave encounter open until she calls back. She misunderstood what she was supposed to do. Will await return call.

## 2017-11-05 NOTE — Telephone Encounter (Signed)
Spoke with patient-she called [employer] and [employer]-she will not qualify for funding assistance. Pt is aware of her 20% co insurance cost for Xolair and stated she would like to proceed with injections. Susan Chavez is aware that Alroy Bailiff and I will discuss her case on Monday and contact her for first appt for Xolair injections. Will need to review injection protocol and send Epipen Rx to local pharmacy.

## 2017-11-08 ENCOUNTER — Telehealth: Payer: Self-pay | Admitting: Internal Medicine

## 2017-11-08 ENCOUNTER — Other Ambulatory Visit: Payer: Medicare Other

## 2017-11-08 DIAGNOSIS — J471 Bronchiectasis with (acute) exacerbation: Secondary | ICD-10-CM

## 2017-11-08 DIAGNOSIS — M810 Age-related osteoporosis without current pathological fracture: Secondary | ICD-10-CM | POA: Diagnosis not present

## 2017-11-08 DIAGNOSIS — M8589 Other specified disorders of bone density and structure, multiple sites: Secondary | ICD-10-CM | POA: Diagnosis not present

## 2017-11-08 LAB — C3 AND C4
C3 COMPLEMENT: 106 mg/dL (ref 83–193)
C4 COMPLEMENT: 31 mg/dL (ref 15–57)

## 2017-11-08 LAB — COMPLEMENT, TOTAL: Compl, Total (CH50): >60 U/mL — ABNORMAL HIGH (ref 31–60)

## 2017-11-08 LAB — HM DEXA SCAN

## 2017-11-08 LAB — ANCA SCREEN W REFLEX TITER: ANCA SCREEN: POSITIVE — AB

## 2017-11-08 LAB — ANA: Anti Nuclear Antibody(ANA): NEGATIVE

## 2017-11-08 NOTE — Telephone Encounter (Signed)
Pt is supposed to have a bone density scan today at 3:00 at [facility], but per pt this order has not been signed.    [facility] callback # per pt: [phone]   Spoke with Judeen Hammans, who is looking further into this to see what is needed.  Routing to [place] at her request.

## 2017-11-08 NOTE — Assessment & Plan Note (Signed)
Progressive symptoms with sustained eosinophilia and elevated IgE. 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 Plan-start Xolair next week, as soon as approval completed.  Meanwhile start prednisone 20 mg daily.  CT chest high resolution looking for bronchiectasis.  Labs for ANCA, Complement, Sec rate, ANA. Referral to [facility].

## 2017-11-08 NOTE — Telephone Encounter (Signed)
Spoke to [place] at [place] gave her verbal ok.  Refaxed order with CY's signature.  Called pt & told her everything has been worked out & she is to go to her appt at 3:00.  Nothing further needed.

## 2017-11-09 ENCOUNTER — Telehealth: Payer: Self-pay | Admitting: Internal Medicine

## 2017-11-09 NOTE — Telephone Encounter (Signed)
I let her know that P-ANCA was elevated and EOS are still elevated.  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 I see we have already made the referral to Dr Magda Kiel at [facility], but she is calling to set her appointment.  She is feeling some better, but not sleeping well. I suggested she try Zzquill.

## 2017-11-11 ENCOUNTER — Encounter: Payer: Self-pay | Admitting: Internal Medicine

## 2017-11-11 ENCOUNTER — Telehealth: Payer: Self-pay | Admitting: Internal Medicine

## 2017-11-11 LAB — RESPIRATORY CULTURE OR RESPIRATORY AND SPUTUM CULTURE
MICRO NUMBER:: 90489492
RESULT: NORMAL
SPECIMEN QUALITY:: ADEQUATE

## 2017-11-11 NOTE — Telephone Encounter (Signed)
Results faxed to Billey Gosling, MD and sent to scan in Epic.

## 2017-11-11 NOTE — Telephone Encounter (Signed)
Called Mrs. Susan Chavez in regards to the bone destiny test Dr. Annamaria Boots reviewed. Patient verbalized understanding and deines any further question at this time.  [name] will fax the copy to her PCP.  Nothing further needed at this time.

## 2017-11-12 ENCOUNTER — Ambulatory Visit (INDEPENDENT_AMBULATORY_CARE_PROVIDER_SITE_OTHER)
Admission: RE | Admit: 2017-11-12 | Discharge: 2017-11-12 | Disposition: A | Discharge status: Home / Self Care | Payer: Medicare Other | Source: Ambulatory Visit | Attending: Internal Medicine | Admitting: Internal Medicine

## 2017-11-12 DIAGNOSIS — J471 Bronchiectasis with (acute) exacerbation: Secondary | ICD-10-CM

## 2017-11-12 DIAGNOSIS — E042 Nontoxic multinodular goiter: Secondary | ICD-10-CM | POA: Diagnosis not present

## 2017-11-13 ENCOUNTER — Encounter: Payer: Self-pay | Admitting: Internal Medicine

## 2017-11-15 NOTE — Telephone Encounter (Signed)
Tammy please advise on update. Thanks. 

## 2017-11-16 ENCOUNTER — Telehealth: Payer: Self-pay | Admitting: Internal Medicine

## 2017-11-16 NOTE — Telephone Encounter (Signed)
Called Dr. Steffanie Dunn office at [facility], office verified that provider does in fact treat adults, which is why pt was scheduled with her. Relayed this to pt who expressed understanding.  Nothing further needed.

## 2017-11-16 NOTE — Telephone Encounter (Signed)
Called and talked to patient. Patient stated that CY referred her to [facility] and she had appointment scheduled for 5/9 with Dr. Magda Kiel. She was called by Rob Hickman and her physician changed to Dr. Cheri Rous who patient reports she looked up and found that this physician specialized in pediatrics. Patient stated she called and tried to get it switched but was told this is who was available.  Patient would like CY opinion on what she should do.   CY please advise.

## 2017-11-16 NOTE — Telephone Encounter (Signed)
She could try asking for Dr Dorann Ou office there. That office assistant could tell her if he also sees adults.

## 2017-11-16 NOTE — Telephone Encounter (Signed)
Pt. Is buy & bill, will order from [facility] when I order other  biologics tomorrow 11/17/17. Per [name] order needed meds every Wed.Marland Kitchen

## 2017-11-17 ENCOUNTER — Telehealth: Payer: Self-pay | Admitting: Internal Medicine

## 2017-11-17 MED ORDER — EPINEPHRINE 0.3 MG/0.3ML IJ SOAJ: 0.3000 mg | Freq: Once | INTRAMUSCULAR | 11 refills | Status: AC

## 2017-11-17 NOTE — Telephone Encounter (Signed)
Ordered xolair, it's coming in tomorrow. Calling pt.. Pt. Is coming for her 1st xolair inj. 11/19/2017. Pt. Is aware of 2 hr. wait I've sent Epi-pen rx to pharmacy, will show pt. How to use it. Pt. Is aware to bring Epi-pen and let me see it.

## 2017-11-17 NOTE — Telephone Encounter (Signed)
Rx sent 

## 2017-11-17 NOTE — Telephone Encounter (Signed)
Prefilled Syringe: #150mg  4  #75mg  2 Ordered Date: 11/17/2017 Shipping Date: 11/17/2017

## 2017-11-18 LAB — FUNGUS CULTURE W SMEAR
MICRO NUMBER:: 90331326
SMEAR: NONE SEEN
SPECIMEN QUALITY: ADEQUATE

## 2017-11-18 LAB — MYCOBACTERIA,CULT W/FLUOROCHROME SMEAR
MICRO NUMBER:: 90331327
SMEAR:: NONE SEEN
SPECIMEN QUALITY:: ADEQUATE

## 2017-11-18 LAB — TIQ-NTM

## 2017-11-18 LAB — RESPIRATORY CULTURE OR RESPIRATORY AND SPUTUM CULTURE
MICRO NUMBER: 90331328
RESULT:: NORMAL
SPECIMEN QUALITY: ADEQUATE

## 2017-11-18 NOTE — Telephone Encounter (Signed)
Prefilled Syringes: # 150mg  4  #75mg  2 Arrival Date: 11/18/2017 Lot #: 150mg  [id number]      75mg  [id number] Exp Date: 150mg  06/2018   75mg  06/2018

## 2017-11-19 ENCOUNTER — Ambulatory Visit: Payer: Medicare Other

## 2017-11-26 ENCOUNTER — Ambulatory Visit: Payer: Medicare Other

## 2017-12-01 LAB — FUNGUS CULTURE W SMEAR
MICRO NUMBER:: 90466618
SMEAR: NONE SEEN
SPECIMEN QUALITY:: ADEQUATE

## 2017-12-01 LAB — RESPIRATORY CULTURE OR RESPIRATORY AND SPUTUM CULTURE
MICRO NUMBER: 90466619
RESULT:: NORMAL
SPECIMEN QUALITY: ADEQUATE

## 2017-12-10 ENCOUNTER — Telehealth: Payer: Self-pay | Admitting: Internal Medicine

## 2017-12-10 NOTE — Telephone Encounter (Signed)
Called and spoke with patient, she states that she is having increased shortness of breath, coughing up yellow mucus, and using her nebulizer 4 times a day. She states that she finished her prednisone but is still using her inhalers and singulair. Patient denies fever, body aches, or chest pain.   When offering patient to see if we could get her in today she changed her mind and said she decided to wait until her appointment with CY on 5.29.19.  Routing to CY as an [place].   Current Outpatient Medications on File Prior to Visit  Medication Sig Dispense Refill  . acyclovir ointment (ZOVIRAX) 5 % Apply 1 application topically every 3 (three) hours. 15 g 5  . albuterol (PROVENTIL) (2.5 MG/3ML) 0.083% nebulizer solution Take 3 mLs (2.5 mg total) by nebulization every 6 (six) hours as needed for wheezing or shortness of breath. 150 mL 12  . chlorpheniramine-HYDROcodone (TUSSIONEX PENNKINETIC ER) 10-8 MG/5ML SUER Take 5 mLs by mouth every 12 (twelve) hours as needed for cough. 200 mL 0  . escitalopram (LEXAPRO) 5 MG tablet Take 1 tablet (5 mg total) by mouth daily. 90 tablet 3  . Glycopyrrolate-Formoterol (BEVESPI AEROSPHERE) 9-4.8 MCG/ACT AERO Inhale 2 puffs into the lungs 2 (two) times daily. 1 Inhaler 12  . hydrochlorothiazide (HYDRODIURIL) 25 MG tablet Take 1 tablet (25 mg total) by mouth daily. 90 tablet 3  . L-Lysine 500 MG TABS Take by mouth as needed (for fever blisters).     . montelukast (SINGULAIR) 10 MG tablet Take 1 tablet (10 mg total) by mouth at bedtime. 30 tablet 11  . predniSONE (DELTASONE) 10 MG tablet 2 tablets daily with breakfast 30 tablet 0  . valACYclovir (VALTREX) 1000 MG tablet 2 tabs po Q 12 hrs x 1 day for fever blister 12 tablet 5   No current facility-administered medications on file prior to visit.    Allergies  Allergen Reactions  . Tetanus Toxoid     Eyelid edema FROM HORSES; therefore , she was told she could not take Tetanus shot.She has had tetanus shot w/o adverse  effect since 2002

## 2017-12-11 ENCOUNTER — Other Ambulatory Visit: Payer: Self-pay | Admitting: Internal Medicine

## 2017-12-14 DIAGNOSIS — J4551 Severe persistent asthma with (acute) exacerbation: Secondary | ICD-10-CM | POA: Diagnosis not present

## 2017-12-14 DIAGNOSIS — I498 Other specified cardiac arrhythmias: Secondary | ICD-10-CM | POA: Diagnosis not present

## 2017-12-14 DIAGNOSIS — R0602 Shortness of breath: Secondary | ICD-10-CM | POA: Diagnosis not present

## 2017-12-14 DIAGNOSIS — R062 Wheezing: Secondary | ICD-10-CM | POA: Diagnosis not present

## 2017-12-14 DIAGNOSIS — Z79899 Other long term (current) drug therapy: Secondary | ICD-10-CM | POA: Diagnosis not present

## 2017-12-14 DIAGNOSIS — R0682 Tachypnea, not elsewhere classified: Secondary | ICD-10-CM | POA: Diagnosis not present

## 2017-12-14 DIAGNOSIS — Z794 Long term (current) use of insulin: Secondary | ICD-10-CM | POA: Diagnosis not present

## 2017-12-14 DIAGNOSIS — J45901 Unspecified asthma with (acute) exacerbation: Secondary | ICD-10-CM | POA: Diagnosis not present

## 2017-12-14 DIAGNOSIS — I10 Essential (primary) hypertension: Secondary | ICD-10-CM | POA: Diagnosis not present

## 2017-12-14 NOTE — Telephone Encounter (Signed)
Patient's husband called.  Appointment for 05/29 has been cancelled as patient has been admitted at [facility].

## 2017-12-14 NOTE — Telephone Encounter (Signed)
Attempted to call to check on patient, no answer, left message that we were checking on pt. Routing to [employer] as [employer]

## 2017-12-15 ENCOUNTER — Ambulatory Visit: Payer: Medicare Other | Admitting: Internal Medicine

## 2017-12-15 DIAGNOSIS — J4551 Severe persistent asthma with (acute) exacerbation: Secondary | ICD-10-CM | POA: Diagnosis not present

## 2017-12-15 MED ORDER — POTASSIUM CHLORIDE CRYS ER 20 MEQ PO TBCR
40.00 | EXTENDED_RELEASE_TABLET | ORAL | Status: DC
Start: 2017-12-16 — End: 2017-12-15

## 2017-12-15 MED ORDER — GENERIC EXTERNAL MEDICATION
Status: DC
Start: ? — End: 2017-12-15

## 2017-12-15 MED ORDER — GENERIC EXTERNAL MEDICATION
60.00 | Status: DC
Start: 2017-12-16 — End: 2017-12-15

## 2017-12-15 MED ORDER — DEXTROSE 50 % IV SOLN
12.50 | INTRAVENOUS | Status: DC
Start: ? — End: 2017-12-15

## 2017-12-15 MED ORDER — GENERIC EXTERNAL MEDICATION
1.00 | Status: DC
Start: ? — End: 2017-12-15

## 2017-12-15 MED ORDER — LIDOCAINE HCL 1 % IJ SOLN
.50 | INTRAMUSCULAR | Status: DC
Start: ? — End: 2017-12-15

## 2017-12-15 MED ORDER — INSULIN REGULAR HUMAN 100 UNIT/ML IJ SOLN
.00 | INTRAMUSCULAR | Status: DC
Start: 2017-12-15 — End: 2017-12-15

## 2017-12-15 MED ORDER — ESCITALOPRAM OXALATE 10 MG PO TABS
5.00 | ORAL_TABLET | ORAL | Status: DC
Start: 2017-12-16 — End: 2017-12-15

## 2017-12-15 MED ORDER — BENZONATATE 100 MG PO CAPS
100.00 | ORAL_CAPSULE | ORAL | Status: DC
Start: ? — End: 2017-12-15

## 2017-12-16 ENCOUNTER — Telehealth: Payer: Self-pay | Admitting: Internal Medicine

## 2017-12-16 MED ORDER — ALBUTEROL SULFATE (2.5 MG/3ML) 0.083% IN NEBU: 2.5000 mg | INHALATION_SOLUTION | Freq: Four times a day (QID) | RESPIRATORY_TRACT | 12 refills | Status: DC | PRN

## 2017-12-16 NOTE — Telephone Encounter (Signed)
Katie received update on patient on 5/29. Nothing further is needed.

## 2017-12-16 NOTE — Telephone Encounter (Signed)
Copied from [place] [id number]. Topic: Quick Communication - See Telephone Encounter >> Dec 16, 2017  9:10 AM Aurelio Brash B wrote: CRM for notification. See Telephone encounter for: 12/16/17.  PT called to say  she received refill  albuterol (PROVENTIL) (2.5 MG/3ML) 0.083% nebulizer solution over the weekend  So  pharmacy needs to send refill approval  now    [facility], [place] - [address] [phone] (Phone) [phone] (Fax)

## 2017-12-17 ENCOUNTER — Other Ambulatory Visit: Payer: Self-pay | Admitting: Internal Medicine

## 2017-12-17 ENCOUNTER — Telehealth: Payer: Self-pay | Admitting: Internal Medicine

## 2017-12-17 LAB — MYCOBACTERIA,CULT W/FLUOROCHROME SMEAR
MICRO NUMBER:: 90466615
SMEAR: NONE SEEN
SPECIMEN QUALITY: ADEQUATE

## 2017-12-17 NOTE — Telephone Encounter (Signed)
Spoke with patient. She wanted to let Dr. Annamaria Boots know that she is home from the hospital and feels great. She wanted to verify that Susan Chavez could see her hospitalization report from [facility]. Advised her that he could.   Nothing else needed at time of call.

## 2017-12-20 DIAGNOSIS — J4550 Severe persistent asthma, uncomplicated: Secondary | ICD-10-CM | POA: Insufficient documentation

## 2017-12-20 DIAGNOSIS — R05 Cough: Secondary | ICD-10-CM | POA: Diagnosis not present

## 2017-12-20 DIAGNOSIS — K219 Gastro-esophageal reflux disease without esophagitis: Secondary | ICD-10-CM | POA: Diagnosis not present

## 2017-12-20 DIAGNOSIS — J309 Allergic rhinitis, unspecified: Secondary | ICD-10-CM | POA: Diagnosis not present

## 2017-12-21 ENCOUNTER — Other Ambulatory Visit: Payer: Self-pay | Admitting: Internal Medicine

## 2017-12-21 ENCOUNTER — Telehealth: Payer: Self-pay | Admitting: Internal Medicine

## 2017-12-21 DIAGNOSIS — R7303 Prediabetes: Secondary | ICD-10-CM

## 2017-12-21 NOTE — Telephone Encounter (Signed)
Ok to order fasting glucose for AM. It will usually settle back to baseline about a week after ending prednisone, more or less.

## 2017-12-21 NOTE — Telephone Encounter (Signed)
Patient is requesting glucose check, because she has been on Prednisone, and would like to know if it has increased.  CY please advise

## 2017-12-21 NOTE — Telephone Encounter (Signed)
Returned Patient call.  Per CY- ok for fasting glucose in AM.  Patient stated understanding.  Fasting glucose ordered. Nothing further needed at this time.

## 2017-12-24 ENCOUNTER — Other Ambulatory Visit (INDEPENDENT_AMBULATORY_CARE_PROVIDER_SITE_OTHER): Payer: Medicare Other

## 2017-12-24 DIAGNOSIS — R7303 Prediabetes: Secondary | ICD-10-CM | POA: Diagnosis not present

## 2017-12-24 LAB — GLUCOSE, RANDOM: Glucose, Bld: 95 mg/dL (ref 70–99)

## 2017-12-31 ENCOUNTER — Encounter: Payer: Self-pay | Admitting: Internal Medicine

## 2017-12-31 ENCOUNTER — Ambulatory Visit: Payer: Medicare Other | Admitting: Internal Medicine

## 2017-12-31 DIAGNOSIS — J3089 Other allergic rhinitis: Secondary | ICD-10-CM | POA: Diagnosis not present

## 2017-12-31 DIAGNOSIS — K219 Gastro-esophageal reflux disease without esophagitis: Secondary | ICD-10-CM | POA: Diagnosis not present

## 2017-12-31 DIAGNOSIS — J302 Other seasonal allergic rhinitis: Secondary | ICD-10-CM | POA: Diagnosis not present

## 2017-12-31 DIAGNOSIS — J4540 Moderate persistent asthma, uncomplicated: Secondary | ICD-10-CM | POA: Diagnosis not present

## 2017-12-31 NOTE — Progress Notes (Signed)
F never smoker  Asthmatic bronchitis; constant cough per patient. Silent GERD PFT 10/06/2007- WNL. FVC 2.66/ 88%, FEV1 2.14/ 98%, FEV1/FVC 0.8, TLC 105%, DLCO 98% Allergy Profile 08/01/13- Total IgE 151.4, Broad elevations for many common environmental allergens FENO 05/21/16- 48= elevation consistent with allergic component to asthma CBC 10/01/17- EOS 11.2 Sputum cultures 10/01/17-normal flora, negative AFB, negative fungal Total IgE 10/14/17- 126   Weight 125 lbs --------------------------------------------- 11/04/2017- 77 year old female never smoker followed for asthmatic bronchitis, chronic cough, allergic rhinitis complicated by GERD ----Asthma; Pt would like to discuss referral to [facility] and continues to have cough. Paperwork is in place to start Xolair next week.  Waiting to hear about grant money. ----Asthma; Pt would like to discuss referral to [facility] and continues to have cough. Repeat sputum cultures from 11/02/2017 are all pending Singulair, Bevespi, neb albuterol    finished Levaquin She says despite my instruction, she was on Levaquin when she submitted her last sputum culture.  We can try once more.  She reports cough is worse despite Levaquin.  She has lost some weight "no energy to eat".  Wakes coughing and not sleeping well.  Up a lot coughing. Had failed to improve with Symbicort and Trelegy.  Cough is productive now of plugs and casts.  Denies fever, rash, adenopathy, joint pain.  No significant sinus involvement. I had previously offered [facility] referral.  A contact of hers has recommended Dr. Magda Kiel at [facility] pulmonary so we will get that referral started. CT MaxFac 09/20/17 1. Mild mucosal thickening along the floor of the right maxillary sinus. 2. No acute fluid levels.  12/31/2017- 77 year old female never smoker followed for asthmatic bronchitis, chronic cough, allergic rhinitis complicated by GERD -----Doing better not having as much SOB, Cough was productive but has gone  away. We reviewed notes from her visit with Dr. Magda Kiel at [facility].  She is now using Breo/Incruse, with only occasional need for nebulizer as she tapers prednisone.  We again emphasized reflux precautions.  He and I had both discussed possibility of using a Biologic, so I discussed that class of therapies with her today.  Dr. Magda Kiel will make that decision.  She clearly is feeling much better.  I suspect this reflects systemic steroids when she was hospitalized.  We discussed her chest CT which does not show bronchiectasis, atypical infection, or ILD. CT chest hi res-11/12/2017 IMPRESSION: 1. No evidence of interstitial lung disease. 2. Aortic atherosclerosis (ICD10-170.0). Three-vessel coronary artery calcification. 3. Low-attenuation thyroid nodules.  ROS-see HPI + = positive Constitutional:   + weight loss, night sweats, fevers, chills, +fatigue, lassitude. HEENT:  +  headaches, difficulty swallowing, tooth/dental problems, sore throat,       No-  sneezing, itching, ear ache, nasal congestion, +post nasal drip,  CV:  No-   chest pain, orthopnea, PND, swelling in lower extremities, anasarca,                                                    dizziness, palpitations Resp:   shortness of breath with exertion or at rest.              productive cough,   +non-productive cough,  No- coughing up of blood.                change in color of mucus.  No- wheezing.  Skin: No-   rash or lesions. GI:  No- heartburn, indigestion, abdominal pain, nausea, vomiting,  GU:  MS:  No-   joint pain or swelling.   Neuro-     nothing unusual Psych:  No- change in mood or affect. No depression +anxiety.  No memory loss.  OBJ- Physical Exam General- Alert, Oriented, Affect-appropriate, Distress- none acute, trim, alert Skin- rash-none, lesions- none, excoriation- none   Lymphadenopathy- none Head- atraumatic            Eyes- Gross vision intact, PERRLA, conjunctivae and secretions clear            Ears-  Hearing, canals-normal,             Nose- + turbinate edema, no-Septal dev, mucus, polyps, erosion, perforation             Throat- Mallampati II , mucosa clear , drainage- none, tonsils- atrophic Neck- flexible , trachea midline, no stridor , thyroid nl, carotid no bruit Chest - symmetrical excursion , unlabored           Heart/CV- RRR , no murmur , no gallop  , no rub, nl s1 s2                           - JVD- none , edema- none, stasis changes- none, varices- none           Lung-  wheeze-none , cough- none, dullness-none, rub- none,            Chest wall-  Abd-  Br/ Gen/ Rectal- Not done, not indicated Extrem- cyanosis- none, clubbing, none, atrophy- none, strength- nl Neuro- grossly intact to observation

## 2017-12-31 NOTE — Patient Instructions (Signed)
We can continue the current strategy. I'm glad you are feeling so much better.  Please call if you need our help.

## 2018-01-03 DIAGNOSIS — K219 Gastro-esophageal reflux disease without esophagitis: Secondary | ICD-10-CM | POA: Insufficient documentation

## 2018-01-03 NOTE — Assessment & Plan Note (Signed)
A previous sustained interval of cough and reactive airway exacerbation responded to rigid control of reflux precautions.  This message was reinforced with her again today.

## 2018-01-03 NOTE — Assessment & Plan Note (Signed)
Control now is much improved.  We are cautiously optimistic, watching as she tapers off systemic steroids. Plan-we greatly appreciate Dr. Dineen Kid input and she is pending her follow-up visit.

## 2018-01-03 NOTE — Assessment & Plan Note (Addendum)
Better control now also reflects recent systemic steroids. Plan-maintain dust and environmental allergen precautions.

## 2018-01-04 DIAGNOSIS — R05 Cough: Secondary | ICD-10-CM | POA: Diagnosis not present

## 2018-01-04 DIAGNOSIS — J309 Allergic rhinitis, unspecified: Secondary | ICD-10-CM | POA: Diagnosis not present

## 2018-01-04 DIAGNOSIS — K219 Gastro-esophageal reflux disease without esophagitis: Secondary | ICD-10-CM | POA: Diagnosis not present

## 2018-01-04 DIAGNOSIS — J4550 Severe persistent asthma, uncomplicated: Secondary | ICD-10-CM | POA: Diagnosis not present

## 2018-01-14 ENCOUNTER — Ambulatory Visit: Payer: Medicare Other | Admitting: Internal Medicine

## 2018-02-02 DIAGNOSIS — L570 Actinic keratosis: Secondary | ICD-10-CM | POA: Diagnosis not present

## 2018-02-02 DIAGNOSIS — D225 Melanocytic nevi of trunk: Secondary | ICD-10-CM | POA: Diagnosis not present

## 2018-02-02 DIAGNOSIS — D224 Melanocytic nevi of scalp and neck: Secondary | ICD-10-CM | POA: Diagnosis not present

## 2018-02-02 DIAGNOSIS — L84 Corns and callosities: Secondary | ICD-10-CM | POA: Diagnosis not present

## 2018-02-08 DIAGNOSIS — R05 Cough: Secondary | ICD-10-CM | POA: Diagnosis not present

## 2018-02-08 DIAGNOSIS — J309 Allergic rhinitis, unspecified: Secondary | ICD-10-CM | POA: Diagnosis not present

## 2018-02-08 DIAGNOSIS — K219 Gastro-esophageal reflux disease without esophagitis: Secondary | ICD-10-CM | POA: Diagnosis not present

## 2018-02-08 DIAGNOSIS — J4550 Severe persistent asthma, uncomplicated: Secondary | ICD-10-CM | POA: Diagnosis not present

## 2018-03-08 NOTE — Progress Notes (Signed)
Subjective:    Patient ID: Susan Chavez, female    DOB: 15-Dec-1940, 77 y.o.   MRN: [id number]  HPI The patient is here for an acute visit.   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  She was told not to worry about it. She thinks it has grown just a little bit.  She now has some in her lower back.  They do not hurt.  They are "loose".  They have not grown.      Medications and allergies reviewed with patient and updated if appropriate.  Patient Active Problem List   Diagnosis Date Noted  . GERD (gastroesophageal reflux disease) 01/03/2018  . Subacute maxillary sinusitis 07/02/2017  . Anxiety 01/12/2017  . Asthma, moderate persistent 01/12/2017  . Prediabetes 01/12/2017  . Sleep difficulties 11/20/2015  . Asthmatic bronchitis with acute exacerbation 05/29/2015  . Osteoporosis 01/18/2015  . Essential hypertension 10/24/2014  . S/P repair of patent ductus arteriosus 12/11/2010  . BENIGN POSITIONAL VERTIGO 06/09/2010  . OVARIAN CYST 06/09/2010  . History of colonic polyps 09/14/2008  . Seasonal and perennial allergic rhinitis 07/25/2007  . Hyperlipidemia 10/14/2006    Current Outpatient Medications on File Prior to Visit  Medication Sig Dispense Refill  . acyclovir ointment (ZOVIRAX) 5 % Apply 1 application topically every 3 (three) hours. 15 g 5  . albuterol (PROVENTIL) (2.5 MG/3ML) 0.083% nebulizer solution Take 3 mLs (2.5 mg total) by nebulization every 6 (six) hours as needed for wheezing or shortness of breath. 150 mL 12  . chlorpheniramine-HYDROcodone (TUSSIONEX PENNKINETIC ER) 10-8 MG/5ML SUER Take 5 mLs by mouth every 12 (twelve) hours as needed for cough. 200 mL 0  . escitalopram (LEXAPRO) 5 MG tablet Take 1 tablet (5 mg total) by mouth daily. 90 tablet 3  . Fluticasone-Umeclidin-Vilant (TRELEGY ELLIPTA) 100-62.5-25 MCG/INH AEPB Inhale into the lungs.    . Glycopyrrolate-Formoterol (BEVESPI AEROSPHERE) 9-4.8 MCG/ACT AERO Inhale 2 puffs into the  lungs 2 (two) times daily. 1 Inhaler 12  . hydrochlorothiazide (HYDRODIURIL) 25 MG tablet Take 1 tablet (25 mg total) by mouth daily. 90 tablet 3  . L-Lysine 500 MG TABS Take by mouth as needed (for fever blisters).     . montelukast (SINGULAIR) 10 MG tablet Take 1 tablet (10 mg total) by mouth at bedtime. 30 tablet 11  . omeprazole (PRILOSEC) 40 MG capsule Take 1 capsule by mouth daily.    . valACYclovir (VALTREX) 1000 MG tablet 2 tabs po Q 12 hrs x 1 day for fever blister 12 tablet 5  . VENTOLIN HFA 108 (90 Base) MCG/ACT inhaler INHALE 1 OR 2 PUFFS INTO THE LUNGS EVERY6 HOURS AS NEEDED FOR WHEEZING OR SHORTNESS OF BREATH 18 g 11   No current facility-administered medications on file prior to visit.     Past Medical History:  Diagnosis Date  . Allergic rhinitis    former patient at [facility] Chest with allergy injections  . Asthma   . Colon polyp 2003  . Hyperlipidemia   . Polio   . seizure 1982   had a seizure x 1 77 yo, only one episode, ? really a seizure    Past Surgical History:  Procedure Laterality Date  . BREAST SURGERY     possible tumor-benign  . COLONOSCOPY    . PATENT DUCTUS ARTERIOUS REPAIR     AGE 61  . TONSILLECTOMY      Social History   Socioeconomic History  . Marital status:  Married    Spouse name: Not on file  . Number of children: 2  . Years of education: Not on file  . Highest education level: Not on file  Occupational History  . Occupation: Dentist business owner-retired  Social Needs  . Financial resource strain: Not on file  . Food insecurity:    Worry: Not on file    Inability: Not on file  . Transportation needs:    Medical: Not on file    Non-medical: Not on file  Tobacco Use  . Smoking status: Never Smoker  . Smokeless tobacco: Never Used  Substance and Sexual Activity  . Alcohol use: Yes    Comment: 7 drinks per week  . Drug use: No  . Sexual activity: Not on file  Lifestyle  . Physical activity:    Days per week: Not on file      Minutes per session: Not on file  . Stress: Not on file  Relationships  . Social connections:    Talks on phone: Not on file    Gets together: Not on file    Attends religious service: Not on file    Active member of club or organization: Not on file    Attends meetings of clubs or organizations: Not on file    Relationship status: Not on file  Other Topics Concern  . Not on file  Social History Narrative   Lives with spouse; has grand children    Family History  Problem Relation Age of Onset  . Depression Mother   . Colon cancer Mother   . Alcohol abuse Father   . Depression Sister   . Breast cancer Paternal Grandmother   . Asthma Son   . Allergic rhinitis Son   . Pancreatic cancer Sister        2010  . Other Sister        polio  . Pancreatic cancer Sister     Review of Systems  Constitutional: Negative for chills and fever.  Skin: Negative for color change and rash.       Minimal growth in present lumps - non painful, mobile  Neurological: Negative for numbness.       Objective:   Vitals:   03/09/18 1256  BP: 138/78  Pulse: 69  Resp: 16  Temp: 98.2 F (36.8 C)  SpO2: 97%   BP Readings from Last 3 Encounters:  03/09/18 138/78  12/31/17 112/62  11/04/17 120/74   Wt Readings from Last 3 Encounters:  03/09/18 127 lb 12.8 oz (58 kg)  12/31/17 125 lb 3.2 oz (56.8 kg)  11/04/17 122 lb 12.8 oz (55.7 kg)   Body mass index is 22.28 kg/m.   Physical Exam  Constitutional: She appears well-developed and well-nourished. No distress.  HENT:  Head: Normocephalic and atraumatic.  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  Several smaller lipomas size of almonds b/l sides of sacrum - mobile, soft, nontender  Skin: Skin is warm and dry. She is not diaphoretic. No erythema.           Assessment & Plan:    See Problem List for Assessment and Plan of chronic medical problems.

## 2018-03-09 ENCOUNTER — Encounter: Payer: Self-pay | Admitting: Internal Medicine

## 2018-03-09 ENCOUNTER — Ambulatory Visit: Payer: Medicare Other | Admitting: Internal Medicine

## 2018-03-09 VITALS — BP 138/78 | HR 69 | Temp 98.2°F | Resp 16 | Ht 63.5 in | Wt 127.8 lb

## 2018-03-09 DIAGNOSIS — D179 Benign lipomatous neoplasm, unspecified: Secondary | ICD-10-CM | POA: Diagnosis not present

## 2018-03-09 NOTE — Assessment & Plan Note (Signed)
Egg sized lipoma right side of lumbar spine Few almond sized lipomas on b/l sacrum Reassured  - no concerning symptoms or signs Monitor for now

## 2018-03-09 NOTE — Patient Instructions (Signed)
Monitor the lipoma - they are not concerning.     Lipoma A lipoma is a noncancerous (benign) tumor that is made up of fat cells. This is a very common type of soft-tissue growth. Lipomas are usually found under the skin (subcutaneous). They may occur in any tissue of the body that contains fat. Common areas for lipomas to appear include the back, shoulders, buttocks, and thighs. Lipomas grow slowly, and they are usually painless. Most lipomas do not cause problems and do not require treatment. What are the causes? The cause of this condition is not known. What increases the risk? This condition is more likely to develop in:  People who are 14-59 years old.  People who have a family history of lipomas.  What are the signs or symptoms? A lipoma usually appears as a small, round bump under the skin. It may feel soft or rubbery, but the firmness can vary. Most lipomas are not painful. However, a lipoma may become painful if it is located in an area where it pushes on nerves. How is this diagnosed? A lipoma can usually be diagnosed with a physical exam. You may also have tests to confirm the diagnosis and to rule out other conditions. Tests may include:  Imaging tests, such as a CT scan or MRI.  Removal of a tissue sample to be looked at under a microscope (biopsy).  How is this treated? Treatment is not needed for small lipomas that are not causing problems. If a lipoma continues to get bigger or it causes problems, removal is often the best option. Lipomas can also be removed to improve appearance. Removal of a lipoma is usually done with a surgery in which the fatty cells and the surrounding capsule are removed. Most often, a medicine that numbs the area (local anesthetic) is used for this procedure. Follow these instructions at home:  Keep all follow-up visits as directed by your health care provider. This is important. Contact a health care provider if:  Your lipoma becomes larger or  hard.  Your lipoma becomes painful, red, or increasingly swollen. These could be signs of infection or a more serious condition. This information is not intended to replace advice given to you by your health care provider. Make sure you discuss any questions you have with your health care provider. Document Released: 06/26/2002 Document Revised: 12/12/2015 Document Reviewed: 07/02/2014 Elsevier Interactive Patient Education  [employer].

## 2018-04-08 ENCOUNTER — Ambulatory Visit: Payer: Medicare Other

## 2018-04-08 ENCOUNTER — Other Ambulatory Visit: Payer: Self-pay | Admitting: Internal Medicine

## 2018-04-08 ENCOUNTER — Ambulatory Visit (INDEPENDENT_AMBULATORY_CARE_PROVIDER_SITE_OTHER): Payer: Medicare Other

## 2018-04-08 DIAGNOSIS — Z23 Encounter for immunization: Secondary | ICD-10-CM | POA: Diagnosis not present

## 2018-04-14 ENCOUNTER — Telehealth: Payer: Self-pay

## 2018-04-14 NOTE — Telephone Encounter (Signed)
Called pt to let her know forms are ready for pick up. Pt aware

## 2018-04-14 NOTE — Telephone Encounter (Signed)
Copied from [place] [id number]. Topic: General - Other >> Apr 14, 2018  1:32 PM Yvette Rack wrote: Reason for CRM:  pt  calling to see if forms that she dropped off last week for Billey Gosling, MD  To fill out if ready for pick up >> Apr 14, 2018  2:09 PM Lonia Skinner [place] A wrote: This is the wife for the one I already sent you, I guess she dropped a form off as well.

## 2018-05-02 ENCOUNTER — Encounter: Payer: Self-pay | Admitting: Internal Medicine

## 2018-05-02 ENCOUNTER — Ambulatory Visit: Payer: Medicare Other | Admitting: Internal Medicine

## 2018-05-02 DIAGNOSIS — J4550 Severe persistent asthma, uncomplicated: Secondary | ICD-10-CM

## 2018-05-02 DIAGNOSIS — K2100 Gastro-esophageal reflux disease with esophagitis, without bleeding: Secondary | ICD-10-CM

## 2018-05-02 MED ORDER — GLYCOPYRROLATE-FORMOTEROL 9-4.8 MCG/ACT IN AERO: 2.0000 | INHALATION_SPRAY | Freq: Two times a day (BID) | RESPIRATORY_TRACT | 0 refills | Status: DC

## 2018-05-02 MED ORDER — MOMETASONE FUROATE 220 MCG/INH IN AEPB: 2.0000 | INHALATION_SPRAY | Freq: Every day | RESPIRATORY_TRACT | 12 refills | Status: DC

## 2018-05-02 NOTE — Patient Instructions (Signed)
Resume omeprazole  Instead of Trelegy, for comparison try:  Sample Bevespi    Inhale 2 puffs, twice daily  Sample Asmanex 220  Inhale 1 puff, then rinse mouth, twice daily  See if your pharmacy can tell what these would cost you, relative to Trelegy.  Another combination you could try to price would have you combine Breo 100 and Incruse  Please cal as needed

## 2018-05-02 NOTE — Progress Notes (Signed)
F never smoker  Asthmatic bronchitis; constant cough per patient. Silent GERD PFT 10/06/2007- WNL. FVC 2.66/ 88%, FEV1 2.14/ 98%, FEV1/FVC 0.8, TLC 105%, DLCO 98% Allergy Profile 08/01/13- Total IgE 151.4, Broad elevations for many common environmental allergens FENO 05/21/16- 48= elevation consistent with allergic component to asthma CBC 10/01/17- EOS 11.2 Sputum cultures 10/01/17-normal flora, negative AFB, negative fungal Total IgE 10/14/17- 126   Weight 125 lbs ---------------------------------------------  05/02/2018- 77 year old female never smoker followed for asthmatic bronchitis, chronic cough, allergic rhinitis complicated by GERD Saw Dr. Evie Lacks at [facility] pulmonary again 02/08/2018-she was doing very well at that time so he stopped Breo and Incruse, replacing them with Trelegy and I reviewed his note with interest. She had normal eosinophils, positive Rast to common allergens, normal IgE, elevated eosinophil count as high as 900, repeat ANCA +1: 80, x-ANCA pattern. He didn't feel she was a candidate for immunotherapy- presumably including Biologics. -----Follows for: Persistent asthma, Trelegy is working for her but she doesnt like the powder/side effects, she gets sores in her mouth with the Trelegy, she reports her voice seems very low possibly due to Trelegy She thinks Trelegy was causing a burning feeling on her gums and lower voice quality but she did not see clear-cut thrush.  She is also concerned about the cost of Trelegy.  She thought she did well with Breo/Incruse.  I discussed available chemistry combinations and multiple co-pays.  She tried off of omeprazole for a couple of days to see if it was causing problems, and immediately experienced bad heartburn/reflux.  Not coughing and says breathing is really pretty good.  ROS-see HPI + = positive Constitutional:   + weight loss, night sweats, fevers, chills, +fatigue, lassitude. HEENT:  +  headaches, difficulty swallowing,  tooth/dental problems, sore throat,       No-  sneezing, itching, ear ache, nasal congestion, +post nasal drip,  CV:  No-   chest pain, orthopnea, PND, swelling in lower extremities, anasarca,                                                    dizziness, palpitations Resp:   shortness of breath with exertion or at rest.              productive cough,   non-productive cough,  No- coughing up of blood.                change in color of mucus.  No- wheezing.   Skin: No-   rash or lesions. GI:  No- heartburn, indigestion, abdominal pain, nausea, vomiting,  GU:  MS:  No-   joint pain or swelling.   Neuro-     nothing unusual Psych:  No- change in mood or affect. No depression +anxiety.  No memory loss.  OBJ- Physical Exam General- Alert, Oriented, Affect-appropriate, Distress- none acute, trim, alert Skin- rash-none, lesions- none, excoriation- none   Lymphadenopathy- none Head- atraumatic            Eyes- Gross vision intact, PERRLA, conjunctivae and secretions clear            Ears- Hearing, canals-normal,             Nose- + turbinate edema, no-Septal dev, mucus, polyps, erosion, perforation             Throat- Mallampati II ,  mucosa clear , drainage- none, tonsils- atrophic Neck- flexible , trachea midline, no stridor , thyroid nl, carotid no bruit Chest - symmetrical excursion , unlabored           Heart/CV- RRR , no murmur , no gallop  , no rub, nl s1 s2                           - JVD- none , edema- none, stasis changes- none, varices- none           Lung-  wheeze-none , cough- none, dullness-none, rub- none,            Chest wall-  Abd-  Br/ Gen/ Rectal- Not done, not indicated Extrem- cyanosis- none, clubbing, none, atrophy- none, strength- nl Neuro- grossly intact to observation

## 2018-05-02 NOTE — Assessment & Plan Note (Addendum)
Dr. Dineen Kid thoughts are much appreciated.  Although Breo/Incruse is similar dry powder technology, she thinks Trelegy is changing her voice and irritating her gums. Fortunately the cough is now gone. Plan-try available samples-Bevespi and Asmanex 220 with discussion, for comparison with Trelegy.

## 2018-05-02 NOTE — Assessment & Plan Note (Signed)
Her experiment, being off omeprazole for 2 days, has led to a dramatic increase in reflux symptoms. Plan-she accepts the point and is resuming omeprazole for reflux control measures.

## 2018-05-03 ENCOUNTER — Telehealth: Payer: Self-pay | Admitting: Internal Medicine

## 2018-05-03 NOTE — Telephone Encounter (Signed)
Spoke with pt. States that she is not feeling well. Reports having issues with GERD and nausea. She took herself off of her acid reflux medication. Since then she has been having horrible nausea and acid reflux. States that she saw CY on 05/02/18 and he instructed her start her Omeprazole again. Denies vomiting or fever. She knows that it is going to take a few days for the medication to start working. Pt would like CY's recommendations in the meantime.  CY - please advise. Thanks.  Allergies  Allergen Reactions  . Tetanus Toxoid     Eyelid edema FROM HORSES; therefore , she was told she could not take Tetanus shot.She has had tetanus shot w/o adverse effect since 2002   Current Outpatient Medications on File Prior to Visit  Medication Sig Dispense Refill  . acyclovir ointment (ZOVIRAX) 5 % Apply 1 application topically every 3 (three) hours. 15 g 5  . albuterol (PROVENTIL) (2.5 MG/3ML) 0.083% nebulizer solution Take 3 mLs (2.5 mg total) by nebulization every 6 (six) hours as needed for wheezing or shortness of breath. 150 mL 12  . chlorpheniramine-HYDROcodone (TUSSIONEX PENNKINETIC ER) 10-8 MG/5ML SUER Take 5 mLs by mouth every 12 (twelve) hours as needed for cough. 200 mL 0  . escitalopram (LEXAPRO) 5 MG tablet Take 1 tablet (5 mg total) by mouth daily. 90 tablet 3  . Fluticasone-Umeclidin-Vilant (TRELEGY ELLIPTA) 100-62.5-25 MCG/INH AEPB Inhale into the lungs.    . Glycopyrrolate-Formoterol (BEVESPI AEROSPHERE) 9-4.8 MCG/ACT AERO Inhale 2 puffs into the lungs 2 (two) times daily. 1 Inhaler 0  . hydrochlorothiazide (HYDRODIURIL) 25 MG tablet Take 1 tablet (25 mg total) by mouth daily. -- Office visit needed for further refills 90 tablet 0  . L-Lysine 500 MG TABS Take by mouth as needed (for fever blisters).     . mometasone (ASMANEX, 30 METERED DOSES,) 220 MCG/INH inhaler Inhale 2 puffs into the lungs daily. 1 Inhaler 12  . montelukast (SINGULAIR) 10 MG tablet Take 1 tablet (10 mg  total) by mouth at bedtime. 30 tablet 11  . omeprazole (PRILOSEC) 40 MG capsule Take 1 capsule by mouth daily.    . valACYclovir (VALTREX) 1000 MG tablet 2 tabs po Q 12 hrs x 1 day for fever blister 12 tablet 5  . VENTOLIN HFA 108 (90 Base) MCG/ACT inhaler INHALE 1 OR 2 PUFFS INTO THE LUNGS EVERY6 HOURS AS NEEDED FOR WHEEZING OR SHORTNESS OF BREATH 18 g 11   No current facility-administered medications on file prior to visit.

## 2018-05-03 NOTE — Progress Notes (Signed)
Subjective:    Patient ID: Susan Chavez, female    DOB: 1940-07-27, 77 y.o.   MRN: [id number]  HPI The patient is here for an acute visit.   GERD:  She had stopped her omeprazole for a few days and her GERD flared up.  She was having vomiting.  She went back on the medication  and her symptoms have improved.     Her inhalers were also changed per Dr Annamaria Boots due to possible side effects.   Low back pain:  It started one year ago.  It has been intermittent.  It is the bilateral lower back.  She is aware of it most of the time.  It gets flared up at times, but no specific activities flare it up.  Sometimes she feels it first thing in the morning or after sitting and standing up.  She denies radiation.  She denies N/ T.  She has applied heat and that helps.  She has never had the back pain evaluated because it has not been anything serious.    Medications and allergies reviewed with patient and updated if appropriate.  Patient Active Problem List   Diagnosis Date Noted  . Lipoma 03/09/2018  . GERD (gastroesophageal reflux disease) 01/03/2018  . Severe persistent asthma without complication 78/29/5621  . Subacute maxillary sinusitis 07/02/2017  . Anxiety 01/12/2017  . Asthma, moderate persistent 01/12/2017  . Prediabetes 01/12/2017  . Sleep difficulties 11/20/2015  . Asthmatic bronchitis with acute exacerbation 05/29/2015  . Osteoporosis 01/18/2015  . Essential hypertension 10/24/2014  . S/P repair of patent ductus arteriosus 12/11/2010  . BENIGN POSITIONAL VERTIGO 06/09/2010  . OVARIAN CYST 06/09/2010  . History of colonic polyps 09/14/2008  . Seasonal and perennial allergic rhinitis 07/25/2007  . Hyperlipidemia 10/14/2006    Current Outpatient Medications on File Prior to Visit  Medication Sig Dispense Refill  . acyclovir ointment (ZOVIRAX) 5 % Apply 1 application topically every 3 (three) hours. 15 g 5  . albuterol (PROVENTIL) (2.5 MG/3ML) 0.083% nebulizer solution Take 3  mLs (2.5 mg total) by nebulization every 6 (six) hours as needed for wheezing or shortness of breath. 150 mL 12  . chlorpheniramine-HYDROcodone (TUSSIONEX PENNKINETIC ER) 10-8 MG/5ML SUER Take 5 mLs by mouth every 12 (twelve) hours as needed for cough. 200 mL 0  . escitalopram (LEXAPRO) 5 MG tablet Take 1 tablet (5 mg total) by mouth daily. 90 tablet 3  . Fluticasone-Umeclidin-Vilant (TRELEGY ELLIPTA) 100-62.5-25 MCG/INH AEPB Inhale into the lungs.    . Glycopyrrolate-Formoterol (BEVESPI AEROSPHERE) 9-4.8 MCG/ACT AERO Inhale 2 puffs into the lungs 2 (two) times daily. 1 Inhaler 0  . hydrochlorothiazide (HYDRODIURIL) 25 MG tablet Take 1 tablet (25 mg total) by mouth daily. -- Office visit needed for further refills 90 tablet 0  . L-Lysine 500 MG TABS Take by mouth as needed (for fever blisters).     . mometasone (ASMANEX, 30 METERED DOSES,) 220 MCG/INH inhaler Inhale 2 puffs into the lungs daily. 1 Inhaler 12  . montelukast (SINGULAIR) 10 MG tablet Take 1 tablet (10 mg total) by mouth at bedtime. 30 tablet 11  . omeprazole (PRILOSEC) 40 MG capsule Take 1 capsule by mouth daily.    . valACYclovir (VALTREX) 1000 MG tablet 2 tabs po Q 12 hrs x 1 day for fever blister 12 tablet 5  . VENTOLIN HFA 108 (90 Base) MCG/ACT inhaler INHALE 1 OR 2 PUFFS INTO THE LUNGS EVERY6 HOURS AS NEEDED FOR WHEEZING OR SHORTNESS OF BREATH 18 g  11   No current facility-administered medications on file prior to visit.     Past Medical History:  Diagnosis Date  . Allergic rhinitis    former patient at [facility] Chest with allergy injections  . Asthma   . Colon polyp 2003  . Hyperlipidemia   . Polio   . seizure 1982   had a seizure x 1 77 yo, only one episode, ? really a seizure    Past Surgical History:  Procedure Laterality Date  . BREAST SURGERY     possible tumor-benign  . COLONOSCOPY    . PATENT DUCTUS ARTERIOUS REPAIR     AGE 83  . TONSILLECTOMY      Social History   Socioeconomic History  . Marital  status: Married    Spouse name: Not on file  . Number of children: 2  . Years of education: Not on file  . Highest education level: Not on file  Occupational History  . Occupation: Dentist business owner-retired  Social Needs  . Financial resource strain: Not on file  . Food insecurity:    Worry: Not on file    Inability: Not on file  . Transportation needs:    Medical: Not on file    Non-medical: Not on file  Tobacco Use  . Smoking status: Never Smoker  . Smokeless tobacco: Never Used  Substance and Sexual Activity  . Alcohol use: Yes    Comment: 7 drinks per week  . Drug use: No  . Sexual activity: Not on file  Lifestyle  . Physical activity:    Days per week: Not on file    Minutes per session: Not on file  . Stress: Not on file  Relationships  . Social connections:    Talks on phone: Not on file    Gets together: Not on file    Attends religious service: Not on file    Active member of club or organization: Not on file    Attends meetings of clubs or organizations: Not on file    Relationship status: Not on file  Other Topics Concern  . Not on file  Social History Narrative   Lives with spouse; has grand children    Family History  Problem Relation Age of Onset  . Depression Mother   . Colon cancer Mother   . Alcohol abuse Father   . Depression Sister   . Breast cancer Paternal Grandmother   . Asthma Son   . Allergic rhinitis Son   . Pancreatic cancer Sister        2010  . Other Sister        polio  . Pancreatic cancer Sister     Review of Systems  Constitutional: Negative for fever.  Gastrointestinal: Negative for abdominal pain and diarrhea.       GERD-much improved in the past few days  Musculoskeletal: Positive for back pain.  Neurological: Negative for weakness and numbness.       Objective:   Vitals:   05/04/18 0844  BP: (!) 146/88  Pulse: 82  Resp: 16  Temp: 98.6 F (37 C)  SpO2: 98%   BP Readings from Last 3 Encounters:    05/04/18 (!) 146/88  05/02/18 140/70  03/09/18 138/78   Wt Readings from Last 3 Encounters:  05/04/18 122 lb (55.3 kg)  05/02/18 124 lb (56.2 kg)  03/09/18 127 lb 12.8 oz (58 kg)   Body mass index is 21.44 kg/m.   Physical Exam  Constitutional: She  appears well-developed and well-nourished. No distress.  HENT:  Head: Normocephalic and atraumatic.  Musculoskeletal: Normal range of motion. She exhibits no edema.  No lumbar spine lower sacral deformity, no tenderness with palpation in lower back  Neurological: No sensory deficit.  Normal strength, gait normal  Skin: Skin is warm and dry. She is not diaphoretic.           Assessment & Plan:    See Problem List for Assessment and Plan of chronic medical problems.

## 2018-05-03 NOTE — Telephone Encounter (Signed)
Called and spoke with pt stating to her to take the omeprazole bid and to follow up with PCP or GI in regards to this issue.  Pt stated she did call and scheduled an appt with PCP tomorrow, 10/16 at 8:45 to address this issue. Nothing further needed.

## 2018-05-03 NOTE — Telephone Encounter (Signed)
I would suggest using omeprazole twice daily, before breakfast and supper. She should call her GI or her PCP, which ever usually manages this problem.

## 2018-05-04 ENCOUNTER — Encounter: Payer: Self-pay | Admitting: Internal Medicine

## 2018-05-04 ENCOUNTER — Encounter

## 2018-05-04 ENCOUNTER — Ambulatory Visit: Payer: Medicare Other | Admitting: Internal Medicine

## 2018-05-04 VITALS — BP 146/88 | HR 82 | Temp 98.6°F | Resp 16 | Ht 63.25 in | Wt 122.0 lb

## 2018-05-04 DIAGNOSIS — G8929 Other chronic pain: Secondary | ICD-10-CM | POA: Diagnosis not present

## 2018-05-04 DIAGNOSIS — M545 Low back pain, unspecified: Secondary | ICD-10-CM | POA: Insufficient documentation

## 2018-05-04 NOTE — Assessment & Plan Note (Signed)
Likely arthritic pain No concerning radiation, numbness/tingling We will hold off on an x-ray at this time Discussed symptomatic treatment with ice, heat, Tylenol or Advil If pain becomes more persistent or she is more frequent flares we will pursue imaging and possibly referral to sports medicine

## 2018-05-04 NOTE — Patient Instructions (Signed)
Take tylenol or Advil is ok on occasion for your back pain.  Continue the heat as needed.  You can try ice.  You can also try biofreeze of something similar on the back.       Back Pain, Adult Many adults have back pain from time to time. Common causes of back pain include:  A strained muscle or ligament.  Wear and tear (degeneration) of the spinal disks.  Arthritis.  A hit to the back.  Back pain can be short-lived (acute) or last a long time (chronic). A physical exam, lab tests, and imaging studies may be done to find the cause of your pain. Follow these instructions at home: Managing pain and stiffness  Take over-the-counter and prescription medicines only as told by your health care provider.  If directed, apply heat to the affected area as often as told by your health care provider. Use the heat source that your health care provider recommends, such as a moist heat pack or a heating pad. ? Place a towel between your skin and the heat source. ? Leave the heat on for 20-30 minutes. ? Remove the heat if your skin turns bright red. This is especially important if you are unable to feel pain, heat, or cold. You have a greater risk of getting burned.  If directed, apply ice to the injured area: ? Put ice in a plastic bag. ? Place a towel between your skin and the bag. ? Leave the ice on for 20 minutes, 2-3 times a day for the first 2-3 days. Activity  Do not stay in bed. Resting more than 1-2 days can delay your recovery.  Take short walks on even surfaces as soon as you are able. Try to increase the length of time you walk each day.  Do not sit, drive, or stand in one place for more than 30 minutes at a time. Sitting or standing for long periods of time can put stress on your back.  Use proper lifting techniques. When you bend and lift, use positions that put less stress on your back: ? [place] your knees. ? Keep the load close to your body. ? Avoid twisting.  Exercise  regularly as told by your health care provider. Exercising will help your back heal faster. This also helps prevent back injuries by keeping muscles strong and flexible.  Your health care provider may recommend that you see a physical therapist. This person can help you come up with a safe exercise program. Do any exercises as told by your physical therapist. Lifestyle  Maintain a healthy weight. Extra weight puts stress on your back and makes it difficult to have good posture.  Avoid activities or situations that make you feel anxious or stressed. Learn ways to manage anxiety and stress. One way to manage stress is through exercise. Stress and anxiety increase muscle tension and can make back pain worse. General instructions  Sleep on a firm mattress in a comfortable position. Try lying on your side with your knees slightly bent. If you lie on your back, put a pillow under your knees.  Follow your treatment plan as told by your health care provider. This may include: ? Cognitive or behavioral therapy. ? Acupuncture or massage therapy. ? Meditation or yoga. Contact a health care provider if:  You have pain that is not relieved with rest or medicine.  You have increasing pain going down into your legs or buttocks.  Your pain does not improve in 2 weeks.  You have pain at night.  You lose weight.  You have a fever or chills. Get help right away if:  You develop new bowel or bladder control problems.  You have unusual weakness or numbness in your arms or legs.  You develop nausea or vomiting.  You develop abdominal pain.  You feel faint. Summary  Many adults have back pain from time to time. A physical exam, lab tests, and imaging studies may be done to find the cause of your pain.  Use proper lifting techniques. When you bend and lift, use positions that put less stress on your back.  Take over-the-counter and prescription medicines and apply heat or ice as directed by  your health care provider. This information is not intended to replace advice given to you by your health care provider. Make sure you discuss any questions you have with your health care provider. Document Released: 07/06/2005 Document Revised: 08/10/2016 Document Reviewed: 08/10/2016 Elsevier Interactive Patient Education  [employer].

## 2018-05-16 DIAGNOSIS — J329 Chronic sinusitis, unspecified: Secondary | ICD-10-CM | POA: Diagnosis not present

## 2018-05-16 DIAGNOSIS — J4550 Severe persistent asthma, uncomplicated: Secondary | ICD-10-CM | POA: Diagnosis not present

## 2018-05-16 DIAGNOSIS — R05 Cough: Secondary | ICD-10-CM | POA: Diagnosis not present

## 2018-05-16 DIAGNOSIS — J309 Allergic rhinitis, unspecified: Secondary | ICD-10-CM | POA: Diagnosis not present

## 2018-05-19 DIAGNOSIS — H02051 Trichiasis without entropian right upper eyelid: Secondary | ICD-10-CM | POA: Diagnosis not present

## 2018-05-19 DIAGNOSIS — H16101 Unspecified superficial keratitis, right eye: Secondary | ICD-10-CM | POA: Diagnosis not present

## 2018-05-19 DIAGNOSIS — H5711 Ocular pain, right eye: Secondary | ICD-10-CM | POA: Diagnosis not present

## 2018-05-19 DIAGNOSIS — Z961 Presence of intraocular lens: Secondary | ICD-10-CM | POA: Diagnosis not present

## 2018-05-24 DIAGNOSIS — H0012 Chalazion right lower eyelid: Secondary | ICD-10-CM | POA: Diagnosis not present

## 2018-06-02 DIAGNOSIS — B078 Other viral warts: Secondary | ICD-10-CM | POA: Diagnosis not present

## 2018-06-02 DIAGNOSIS — L821 Other seborrheic keratosis: Secondary | ICD-10-CM | POA: Diagnosis not present

## 2018-06-03 DIAGNOSIS — H0012 Chalazion right lower eyelid: Secondary | ICD-10-CM | POA: Diagnosis not present

## 2018-07-05 ENCOUNTER — Other Ambulatory Visit: Payer: Self-pay | Admitting: Internal Medicine

## 2018-08-01 ENCOUNTER — Other Ambulatory Visit: Payer: Self-pay | Admitting: Internal Medicine

## 2018-08-04 DIAGNOSIS — L821 Other seborrheic keratosis: Secondary | ICD-10-CM | POA: Diagnosis not present

## 2018-08-04 DIAGNOSIS — D225 Melanocytic nevi of trunk: Secondary | ICD-10-CM | POA: Diagnosis not present

## 2018-08-04 DIAGNOSIS — L820 Inflamed seborrheic keratosis: Secondary | ICD-10-CM | POA: Diagnosis not present

## 2018-08-04 DIAGNOSIS — L720 Epidermal cyst: Secondary | ICD-10-CM | POA: Diagnosis not present

## 2018-08-15 DIAGNOSIS — Z124 Encounter for screening for malignant neoplasm of cervix: Secondary | ICD-10-CM | POA: Diagnosis not present

## 2018-08-15 DIAGNOSIS — Z1231 Encounter for screening mammogram for malignant neoplasm of breast: Secondary | ICD-10-CM | POA: Diagnosis not present

## 2018-08-15 DIAGNOSIS — Z1389 Encounter for screening for other disorder: Secondary | ICD-10-CM | POA: Diagnosis not present

## 2018-08-15 DIAGNOSIS — Z01419 Encounter for gynecological examination (general) (routine) without abnormal findings: Secondary | ICD-10-CM | POA: Diagnosis not present

## 2018-08-15 DIAGNOSIS — Z6821 Body mass index (BMI) 21.0-21.9, adult: Secondary | ICD-10-CM | POA: Diagnosis not present

## 2018-08-16 DIAGNOSIS — J309 Allergic rhinitis, unspecified: Secondary | ICD-10-CM | POA: Diagnosis not present

## 2018-08-16 DIAGNOSIS — J4550 Severe persistent asthma, uncomplicated: Secondary | ICD-10-CM | POA: Diagnosis not present

## 2018-08-16 DIAGNOSIS — R05 Cough: Secondary | ICD-10-CM | POA: Diagnosis not present

## 2018-08-16 DIAGNOSIS — K219 Gastro-esophageal reflux disease without esophagitis: Secondary | ICD-10-CM | POA: Diagnosis not present

## 2018-08-30 ENCOUNTER — Other Ambulatory Visit: Payer: Self-pay | Admitting: Internal Medicine

## 2018-10-06 NOTE — Progress Notes (Deleted)
Subjective:    Patient ID: Susan Chavez, female    DOB: 10-Mar-1941, 78 y.o.   MRN: [id number]  HPI The patient is here for follow up.  Hypertension: She is taking her medication daily. She is compliant with a low sodium diet.  She denies chest pain, palpitations, edema, shortness of breath and regular headaches. She is exercising regularly.  She does not monitor her blood pressure at home.    GERD:  She is taking her medication daily as prescribed.  She denies any GERD symptoms and feels her GERD is well controlled.   Asthma:  Anxiety: She is taking her medication daily as prescribed. She denies any side effects from the medication. She feels her anxiety is well controlled and she is happy with her current dose of medication.   Prediabetes:  She is compliant with a low sugar/carbohydrate diet.  She is exercising regularly.   Medications and allergies reviewed with patient and updated if appropriate.  Patient Active Problem List   Diagnosis Date Noted  . Chronic bilateral low back pain without sciatica 05/04/2018  . Lipoma 03/09/2018  . GERD (gastroesophageal reflux disease) 01/03/2018  . Severe persistent asthma without complication 81/07/7508  . Subacute maxillary sinusitis 07/02/2017  . Anxiety 01/12/2017  . Asthma, moderate persistent 01/12/2017  . Prediabetes 01/12/2017  . Sleep difficulties 11/20/2015  . Asthmatic bronchitis with acute exacerbation 05/29/2015  . Osteoporosis 01/18/2015  . Essential hypertension 10/24/2014  . S/P repair of patent ductus arteriosus 12/11/2010  . BENIGN POSITIONAL VERTIGO 06/09/2010  . OVARIAN CYST 06/09/2010  . History of colonic polyps 09/14/2008  . Seasonal and perennial allergic rhinitis 07/25/2007  . Hyperlipidemia 10/14/2006    Current Outpatient Medications on File Prior to Visit  Medication Sig Dispense Refill  . acyclovir ointment (ZOVIRAX) 5 % Apply 1 application topically every 3 (three) hours. 15 g 5  . albuterol  (PROVENTIL) (2.5 MG/3ML) 0.083% nebulizer solution Take 3 mLs (2.5 mg total) by nebulization every 6 (six) hours as needed for wheezing or shortness of breath. 150 mL 12  . chlorpheniramine-HYDROcodone (TUSSIONEX PENNKINETIC ER) 10-8 MG/5ML SUER Take 5 mLs by mouth every 12 (twelve) hours as needed for cough. 200 mL 0  . escitalopram (LEXAPRO) 5 MG tablet Take 1 tablet daily. Need office visit for more refills. 30 tablet 3  . Fluticasone-Umeclidin-Vilant (TRELEGY ELLIPTA) 100-62.5-25 MCG/INH AEPB Inhale into the lungs.    . Glycopyrrolate-Formoterol (BEVESPI AEROSPHERE) 9-4.8 MCG/ACT AERO Inhale 2 puffs into the lungs 2 (two) times daily. 1 Inhaler 0  . hydrochlorothiazide (HYDRODIURIL) 25 MG tablet Take 1 tablet (25 mg total) by mouth daily. Need office visit for more refills. 30 tablet 0  . L-Lysine 500 MG TABS Take by mouth as needed (for fever blisters).     . mometasone (ASMANEX, 30 METERED DOSES,) 220 MCG/INH inhaler Inhale 2 puffs into the lungs daily. 1 Inhaler 12  . montelukast (SINGULAIR) 10 MG tablet TAKE ONE TABLET AT BEDTIME 30 tablet 11  . omeprazole (PRILOSEC) 40 MG capsule Take 1 capsule by mouth daily.    . valACYclovir (VALTREX) 1000 MG tablet 2 tabs po Q 12 hrs x 1 day for fever blister 12 tablet 5  . VENTOLIN HFA 108 (90 Base) MCG/ACT inhaler INHALE 1 OR 2 PUFFS INTO THE LUNGS EVERY6 HOURS AS NEEDED FOR WHEEZING OR SHORTNESS OF BREATH 18 g 11   No current facility-administered medications on file prior to visit.     Past Medical History:  Diagnosis Date  .  Allergic rhinitis    former patient at [facility] Chest with allergy injections  . Asthma   . Colon polyp 2003  . Hyperlipidemia   . Polio   . seizure 1982   had a seizure x 1 78 yo, only one episode, ? really a seizure    Past Surgical History:  Procedure Laterality Date  . BREAST SURGERY     possible tumor-benign  . COLONOSCOPY    . PATENT DUCTUS ARTERIOUS REPAIR     AGE 89  . TONSILLECTOMY      Social History    Socioeconomic History  . Marital status: Married    Spouse name: Not on file  . Number of children: 2  . Years of education: Not on file  . Highest education level: Not on file  Occupational History  . Occupation: Dentist business owner-retired  Social Needs  . Financial resource strain: Not on file  . Food insecurity:    Worry: Not on file    Inability: Not on file  . Transportation needs:    Medical: Not on file    Non-medical: Not on file  Tobacco Use  . Smoking status: Never Smoker  . Smokeless tobacco: Never Used  Substance and Sexual Activity  . Alcohol use: Yes    Comment: 7 drinks per week  . Drug use: No  . Sexual activity: Not on file  Lifestyle  . Physical activity:    Days per week: Not on file    Minutes per session: Not on file  . Stress: Not on file  Relationships  . Social connections:    Talks on phone: Not on file    Gets together: Not on file    Attends religious service: Not on file    Active member of club or organization: Not on file    Attends meetings of clubs or organizations: Not on file    Relationship status: Not on file  Other Topics Concern  . Not on file  Social History Narrative   Lives with spouse; has grand children    Family History  Problem Relation Age of Onset  . Depression Mother   . Colon cancer Mother   . Alcohol abuse Father   . Depression Sister   . Breast cancer Paternal Grandmother   . Asthma Son   . Allergic rhinitis Son   . Pancreatic cancer Sister        2010  . Other Sister        polio  . Pancreatic cancer Sister     Review of Systems     Objective:  There were no vitals filed for this visit. BP Readings from Last 3 Encounters:  05/04/18 (!) 146/88  05/02/18 140/70  03/09/18 138/78   Wt Readings from Last 3 Encounters:  05/04/18 122 lb (55.3 kg)  05/02/18 124 lb (56.2 kg)  03/09/18 127 lb 12.8 oz (58 kg)   There is no height or weight on file to calculate BMI.   Physical Exam     Constitutional: Appears well-developed and well-nourished. No distress.  HENT:  Head: Normocephalic and atraumatic.  Neck: Neck supple. No tracheal deviation present. No thyromegaly present.  No cervical lymphadenopathy Cardiovascular: Normal rate, regular rhythm and normal heart sounds.   No murmur heard. No carotid bruit .  No edema Pulmonary/Chest: Effort normal and breath sounds normal. No respiratory distress. No has no wheezes. No rales.  Skin: Skin is warm and dry. Not diaphoretic.  Psychiatric: Normal mood and  affect. Behavior is normal.      Assessment & Plan:    See Problem List for Assessment and Plan of chronic medical problems.

## 2018-10-07 ENCOUNTER — Ambulatory Visit: Payer: Medicare Other | Admitting: Internal Medicine

## 2018-10-07 ENCOUNTER — Telehealth: Payer: Self-pay | Admitting: Internal Medicine

## 2018-10-07 MED ORDER — OMEPRAZOLE 40 MG PO CPDR: 40.0000 mg | DELAYED_RELEASE_CAPSULE | Freq: Every day | ORAL | 1 refills | Status: DC

## 2018-10-07 MED ORDER — HYDROCHLOROTHIAZIDE 25 MG PO TABS: 25.0000 mg | ORAL_TABLET | Freq: Every day | ORAL | 0 refills | Status: DC

## 2018-10-07 NOTE — Telephone Encounter (Signed)
Copied from [place] [id number]. Topic: Quick Communication - Rx Refill/Question >> Oct 07, 2018 11:04 AM Blase Mess A wrote: Medication: omeprazole (PRILOSEC) 40 MG capsule [[id number]]   Has the patient contacted their pharmacy? Yes  (Agent: If no, request that the patient contact the pharmacy for the refill.) (Agent: If yes, when and what did the pharmacy advise?)  Preferred Pharmacy (with phone number or street name): [facility] [address] [phone]  Agent: Please be advised that RX refills may take up to 3 business days. We ask that you follow-up with your pharmacy.

## 2018-10-07 NOTE — Telephone Encounter (Signed)
Rx sent 

## 2018-10-07 NOTE — Telephone Encounter (Signed)
Copied from [place] [id number]. Topic: Quick Communication - See Telephone Encounter >> Oct 07, 2018 11:07 AM Blase Mess A wrote: CRM for notification. See Telephone encounter for: 10/07/18.  Patient is calling does she need to continue on hydrochlorothiazide (HYDRODIURIL) 25 MG tablet [[id number]] If she please refill . Thank you [place], [place] [id number]

## 2018-10-11 ENCOUNTER — Telehealth: Payer: Self-pay | Admitting: Internal Medicine

## 2018-10-11 MED ORDER — MONTELUKAST SODIUM 10 MG PO TABS: 10.0000 mg | ORAL_TABLET | Freq: Every day | ORAL | 1 refills | Status: DC

## 2018-10-11 NOTE — Telephone Encounter (Signed)
Called and spoke with patient, verified pharmacy to have medication sent to. Refill sent. Nothing further needed.

## 2018-11-01 ENCOUNTER — Ambulatory Visit: Payer: Medicare Other | Admitting: Internal Medicine

## 2018-11-01 NOTE — Progress Notes (Signed)
Virtual Visit via Video Note  I connected with Susan Chavez on 11/01/18 at  9:15 AM EDT by a video enabled telemedicine application and verified that I am speaking with the correct person using two identifiers.   I discussed the limitations of evaluation and management by telemedicine and the availability of in person appointments. The patient expressed understanding and agreed to proceed.  The patient is currently at home and I am in the office.    No referring provider.    History of Present Illness: She is here for follow up of her chronic medical conditions.   She has moved to a townhouse since she was here last.  Her husband sold her house and will be moving into a retirement community next year.  She did strain her neck while moving.  She states soreness, stiffness and a creaking sound.  She has used heat a little bit, but feels she should probably do that more.  She has topical arthritis medications at home, but has not used them.  She was unsure what else to do.  Overall the pain is tolerable and mild.  Hypertension: She is taking her medication daily. She is compliant with a low sodium diet.  She denies chest pain, palpitations, edema, shortness of breath now and regular headaches.    Moderate, persistent Asthma: Her asthma is doing very well except for a week ago with the increased pollen she started having coughing, mild sore throat and shortness of breath.  She did communicate with her asthma/allergy doctor.  She is using all of her medications as prescribed.  She has been staying inside and not going out-prior to this she was walking outside daily.  Present symptoms are much better and she is not having any wheezing, shortness of breath or coughing at this time.  She has not had any fevers.  Anxiety: She is taking her medication daily as prescribed. She denies any side effects from the medication. She feels her anxiety is well controlled and she is happy with her current dose of  medication.   Prediabetes:  She is compliant with a low sugar/carbohydrate diet.  She is exercising.  She does need a couple of refills today.  Social History   Socioeconomic History  . Marital status: Married    Spouse name: Not on file  . Number of children: 2  . Years of education: Not on file  . Highest education level: Not on file  Occupational History  . Occupation: Dentist business owner-retired  Social Needs  . Financial resource strain: Not on file  . Food insecurity:    Worry: Not on file    Inability: Not on file  . Transportation needs:    Medical: Not on file    Non-medical: Not on file  Tobacco Use  . Smoking status: Never Smoker  . Smokeless tobacco: Never Used  Substance and Sexual Activity  . Alcohol use: Yes    Comment: 7 drinks per week  . Drug use: No  . Sexual activity: Not on file  Lifestyle  . Physical activity:    Days per week: Not on file    Minutes per session: Not on file  . Stress: Not on file  Relationships  . Social connections:    Talks on phone: Not on file    Gets together: Not on file    Attends religious service: Not on file    Active member of club or organization: Not on file    Attends  meetings of clubs or organizations: Not on file    Relationship status: Not on file  Other Topics Concern  . Not on file  Social History Narrative   Lives with spouse; has grand children     Observations/Objective: Appears well in NAD Breathing normally, no obvious shortness of breath Mood and affect normal  Lab Results  Component Value Date   WBC 7.0 11/04/2017   HGB 13.5 11/04/2017   HCT 39.6 11/04/2017   PLT 321.0 11/04/2017   GLUCOSE 95 12/24/2017   CHOL 259 (H) 01/12/2017   TRIG 73.0 01/12/2017   HDL 118.10 01/12/2017   LDLCALC 126 (H) 01/12/2017   ALT 14 01/12/2017   AST 21 01/12/2017   NA 140 01/12/2017   K 3.7 01/12/2017   CL 102 01/12/2017   CREATININE 0.86 01/12/2017   BUN 13 01/12/2017   CO2 30 01/12/2017    TSH 2.12 01/12/2017   HGBA1C 5.7 01/12/2017    Assessment and Plan:  See Problem List for Assessment and Plan of chronic medical problems.   Follow Up Instructions:    I discussed the assessment and treatment plan with the patient. The patient was provided an opportunity to ask questions and all were answered. The patient agreed with the plan and demonstrated an understanding of the instructions.   The patient was advised to call back or seek an in-person evaluation if the symptoms worsen or if the condition fails to improve as anticipated.    Binnie Rail, MD

## 2018-11-02 ENCOUNTER — Ambulatory Visit (INDEPENDENT_AMBULATORY_CARE_PROVIDER_SITE_OTHER): Payer: Medicare Other | Admitting: Internal Medicine

## 2018-11-02 ENCOUNTER — Encounter: Payer: Self-pay | Admitting: Internal Medicine

## 2018-11-02 DIAGNOSIS — R7303 Prediabetes: Secondary | ICD-10-CM

## 2018-11-02 DIAGNOSIS — I10 Essential (primary) hypertension: Secondary | ICD-10-CM

## 2018-11-02 DIAGNOSIS — F419 Anxiety disorder, unspecified: Secondary | ICD-10-CM | POA: Diagnosis not present

## 2018-11-02 DIAGNOSIS — J4540 Moderate persistent asthma, uncomplicated: Secondary | ICD-10-CM | POA: Diagnosis not present

## 2018-11-02 DIAGNOSIS — M542 Cervicalgia: Secondary | ICD-10-CM

## 2018-11-02 MED ORDER — ESCITALOPRAM OXALATE 5 MG PO TABS: ORAL_TABLET | ORAL | 1 refills | Status: DC

## 2018-11-02 MED ORDER — HYDROCHLOROTHIAZIDE 25 MG PO TABS: 25.0000 mg | ORAL_TABLET | Freq: Every day | ORAL | 1 refills | Status: DC

## 2018-11-02 NOTE — Assessment & Plan Note (Signed)
Moderate, persistent asthma Follows with asthma/allergy specialist Currently well controlled, but last week did have a flare of her asthma do that pollen She is taking all of her inhalers/medications as prescribed Currently feels her asthma is controlled-she is just staying out the pollen

## 2018-11-02 NOTE — Assessment & Plan Note (Signed)
Last A1c 5.7% Sugars have always been well controlled-only mildly elevated We will recheck at her next visit

## 2018-11-02 NOTE — Assessment & Plan Note (Signed)
Mild neck pain from moving Likely muscular or arthritic in nature Continue heat-she will try to do this a few times during the day We will also try topical arthritis medications Can take Tylenol or Advil if needed Let me know if there is no improvement

## 2018-11-02 NOTE — Assessment & Plan Note (Signed)
BP Readings from Last 3 Encounters:  05/04/18 (!) 146/88  05/02/18 140/70  03/09/18 138/78   Blood pressure has been fairly well controlled in the past Continue hydrochlorothiazide

## 2018-11-02 NOTE — Assessment & Plan Note (Signed)
Anxiety well-controlled Continue low-dose of Lexapro

## 2018-12-05 ENCOUNTER — Ambulatory Visit: Payer: Medicare Other | Admitting: Internal Medicine

## 2018-12-13 ENCOUNTER — Telehealth: Payer: Self-pay | Admitting: Internal Medicine

## 2018-12-13 MED ORDER — ALBUTEROL SULFATE HFA 108 (90 BASE) MCG/ACT IN AERS: INHALATION_SPRAY | RESPIRATORY_TRACT | 5 refills | Status: DC

## 2018-12-13 NOTE — Telephone Encounter (Signed)
Call returned to patient made aware the refill has been sent in. Nothing further is needed at this time.

## 2019-01-30 DIAGNOSIS — M25511 Pain in right shoulder: Secondary | ICD-10-CM | POA: Diagnosis not present

## 2019-01-30 DIAGNOSIS — M542 Cervicalgia: Secondary | ICD-10-CM | POA: Diagnosis not present

## 2019-02-03 ENCOUNTER — Ambulatory Visit: Payer: Medicare Other | Admitting: Internal Medicine

## 2019-02-07 DIAGNOSIS — J309 Allergic rhinitis, unspecified: Secondary | ICD-10-CM | POA: Diagnosis not present

## 2019-02-07 DIAGNOSIS — K219 Gastro-esophageal reflux disease without esophagitis: Secondary | ICD-10-CM | POA: Diagnosis not present

## 2019-02-07 DIAGNOSIS — R05 Cough: Secondary | ICD-10-CM | POA: Diagnosis not present

## 2019-02-07 DIAGNOSIS — J4550 Severe persistent asthma, uncomplicated: Secondary | ICD-10-CM | POA: Diagnosis not present

## 2019-02-09 DIAGNOSIS — J45909 Unspecified asthma, uncomplicated: Secondary | ICD-10-CM | POA: Diagnosis not present

## 2019-02-09 DIAGNOSIS — M542 Cervicalgia: Secondary | ICD-10-CM | POA: Diagnosis not present

## 2019-02-09 DIAGNOSIS — I10 Essential (primary) hypertension: Secondary | ICD-10-CM | POA: Diagnosis not present

## 2019-02-20 DIAGNOSIS — M542 Cervicalgia: Secondary | ICD-10-CM | POA: Diagnosis not present

## 2019-02-23 DIAGNOSIS — M542 Cervicalgia: Secondary | ICD-10-CM | POA: Diagnosis not present

## 2019-02-27 DIAGNOSIS — M542 Cervicalgia: Secondary | ICD-10-CM | POA: Diagnosis not present

## 2019-03-02 DIAGNOSIS — M542 Cervicalgia: Secondary | ICD-10-CM | POA: Diagnosis not present

## 2019-03-06 DIAGNOSIS — M542 Cervicalgia: Secondary | ICD-10-CM | POA: Diagnosis not present

## 2019-03-07 DIAGNOSIS — L720 Epidermal cyst: Secondary | ICD-10-CM | POA: Diagnosis not present

## 2019-03-07 DIAGNOSIS — L438 Other lichen planus: Secondary | ICD-10-CM | POA: Diagnosis not present

## 2019-03-07 DIAGNOSIS — L814 Other melanin hyperpigmentation: Secondary | ICD-10-CM | POA: Diagnosis not present

## 2019-03-07 DIAGNOSIS — L821 Other seborrheic keratosis: Secondary | ICD-10-CM | POA: Diagnosis not present

## 2019-03-08 DIAGNOSIS — M542 Cervicalgia: Secondary | ICD-10-CM | POA: Diagnosis not present

## 2019-03-14 ENCOUNTER — Ambulatory Visit: Payer: Medicare Other | Admitting: Internal Medicine

## 2019-03-17 ENCOUNTER — Ambulatory Visit: Payer: Medicare Other | Admitting: Internal Medicine

## 2019-03-22 DIAGNOSIS — H5203 Hypermetropia, bilateral: Secondary | ICD-10-CM | POA: Diagnosis not present

## 2019-04-04 DIAGNOSIS — Z13220 Encounter for screening for lipoid disorders: Secondary | ICD-10-CM | POA: Diagnosis not present

## 2019-04-04 DIAGNOSIS — I10 Essential (primary) hypertension: Secondary | ICD-10-CM | POA: Diagnosis not present

## 2019-04-17 ENCOUNTER — Telehealth: Payer: Self-pay | Admitting: Internal Medicine

## 2019-04-17 MED ORDER — MONTELUKAST SODIUM 10 MG PO TABS: 10.0000 mg | ORAL_TABLET | Freq: Every day | ORAL | 1 refills | Status: DC

## 2019-04-17 NOTE — Telephone Encounter (Signed)
Patient requested a refill of montelukast to [facility] at [place].  Patient has scheduled follow up with CY, 04/27/19.  Montelukast refill sent to requested pharmacy.  Nothing further at this time.

## 2019-04-17 NOTE — Telephone Encounter (Signed)
Med she needs is montelukast called to [facility] in [place] # [phone].Susan Chavez

## 2019-04-17 NOTE — Telephone Encounter (Signed)
Called and spoke w/ pt. Pt states she needs a refill of Symbicort. Upon looking at her records, I noted CY did not prescribe or recommend Trelegy for pt. I let pt know of this, which she verbalized understanding to and stated her pulmonologist from [place] may have prescribed it for her. She further notes she will contact this provider to have Symbicort refilled.   I also verified with pt that her montelukast prescription has been sent to the correct pharmacy. Order has been placed per previous telephone note. Nothing further needed at this time.

## 2019-04-17 NOTE — Telephone Encounter (Signed)
Error-wrong dr

## 2019-04-27 ENCOUNTER — Other Ambulatory Visit: Payer: Self-pay

## 2019-04-27 ENCOUNTER — Encounter: Payer: Self-pay | Admitting: Internal Medicine

## 2019-04-27 ENCOUNTER — Ambulatory Visit: Payer: Medicare Other | Admitting: Internal Medicine

## 2019-04-27 DIAGNOSIS — J4540 Moderate persistent asthma, uncomplicated: Secondary | ICD-10-CM | POA: Diagnosis not present

## 2019-04-27 DIAGNOSIS — J3089 Other allergic rhinitis: Secondary | ICD-10-CM | POA: Diagnosis not present

## 2019-04-27 DIAGNOSIS — J302 Other seasonal allergic rhinitis: Secondary | ICD-10-CM | POA: Diagnosis not present

## 2019-04-27 MED ORDER — AZITHROMYCIN 250 MG PO TABS: ORAL_TABLET | ORAL | 0 refills | Status: DC

## 2019-04-27 NOTE — Patient Instructions (Signed)
Script sent for Zpak    I'm glad Symbicort is working well for you. Let us know if we can help.

## 2019-04-27 NOTE — Progress Notes (Signed)
F never smoker  Asthmatic bronchitis; constant cough per patient. Silent GERD PFT 10/06/2007- WNL. FVC 2.66/ 88%, FEV1 2.14/ 98%, FEV1/FVC 0.8, TLC 105%, DLCO 98% Allergy Profile 08/01/13- Total IgE 151.4, Broad elevations for many common environmental allergens FENO 05/21/16- 48= elevation consistent with allergic component to asthma CBC 10/01/17- EOS 11.2 Sputum cultures 10/01/17-normal flora, negative AFB, negative fungal Total IgE 10/14/17- 126   Weight 125 lbs ---------------------------------------------  05/02/2018- 78 year old female never smoker followed for asthmatic bronchitis, chronic cough, allergic rhinitis complicated by GERD Saw Dr. Evie Lacks at [facility] pulmonary again 02/08/2018-she was doing very well at that time so he stopped Breo and Incruse, replacing them with Trelegy and I reviewed his note with interest. She had normal eosinophils, positive Rast to common allergens, normal IgE, elevated eosinophil count as high as 900, repeat ANCA +1: 80, x-ANCA pattern. He didn't feel she was a candidate for immunotherapy- presumably including Biologics. -----Follows for: Persistent asthma, Trelegy is working for her but she doesnt like the powder/side effects, she gets sores in her mouth with the Trelegy, she reports her voice seems very low possibly due to Trelegy She thinks Trelegy was causing a burning feeling on her gums and lower voice quality but she did not see clear-cut thrush.  She is also concerned about the cost of Trelegy.  She thought she did well with Breo/Incruse.  I discussed available chemistry combinations and multiple co-pays.  She tried off of omeprazole for a couple of days to see if it was causing problems, and immediately experienced bad heartburn/reflux.  Not coughing and says breathing is really pretty good.  04/27/2019- 78 year old female never smoker followed for  Allergic asthmatic bronchitis, chronic cough, allergic rhinitis complicated by GERD -----Asthma; pt states  her breathing is significantly better since LOV; however, has been taking Mucinex for about 1 week d/t sinus congestion and postnasal drainage Now on Symbicort 80. Had flu shot Still following with Dr Evie Lacks at [facility]. Says management for silent reflux has helped cough- avoiding alcohol, caffeine, chocolate, citrus and peppermint.  Walks daily with mask. Covid careful.  This week having  maxillary sinus pressure, post nasal drip. No fever or purulent. Using neti pot.  ROS-see HPI + = positive Constitutional:   + weight loss, night sweats, fevers, chills, +fatigue, lassitude. HEENT:  +  headaches, difficulty swallowing, tooth/dental problems, sore throat,       No-  sneezing, itching, ear ache, nasal congestion, +post nasal drip,  CV:  No-   chest pain, orthopnea, PND, swelling in lower extremities, anasarca,                                                    dizziness, palpitations Resp:   shortness of breath with exertion or at rest.              productive cough,   non-productive cough,  No- coughing up of blood.                change in color of mucus.  No- wheezing.   Skin: No-   rash or lesions. GI:  No- heartburn, indigestion, abdominal pain, nausea, vomiting,  GU:  MS:  No-   joint pain or swelling.   Neuro-     nothing unusual Psych:  No- change in mood or affect. No depression +anxiety.  No memory loss.  OBJ- Physical Exam General- Alert, Oriented, Affect-appropriate, Distress- none acute, trim, alert Skin- rash-none, lesions- none, excoriation- none   Lymphadenopathy- none Head- atraumatic            Eyes- Gross vision intact, PERRLA, conjunctivae and secretions clear            Ears- Hearing, canals-normal,             Nose- + turbinate edema, no-Septal dev, mucus, polyps, erosion, perforation             Throat- Mallampati II , mucosa clear , drainage- none, tonsils- atrophic Neck- flexible , trachea midline, no stridor , thyroid nl, carotid no bruit Chest  - symmetrical excursion , unlabored           Heart/CV- RRR , no murmur , no gallop  , no rub, nl s1 s2                           - JVD- none , edema- none, stasis changes- none, varices- none           Lung-  Wheeze+ mild expiratory upper lobes , cough- none, dullness-none, rub- none,            Chest wall-  Abd-  Br/ Gen/ Rectal- Not done, not indicated Extrem- cyanosis- none, clubbing, none, atrophy- none, strength- nl Neuro- grossly intact to observation

## 2019-04-28 NOTE — Assessment & Plan Note (Signed)
Persistent but controlled. Has not started on a Biologic, but would qualify if needed. Plan- no change

## 2019-04-28 NOTE — Assessment & Plan Note (Signed)
Increased symptoms this week. We discussed providing antibiotic, continuing Net pot, decongestant.

## 2019-05-04 DIAGNOSIS — Z1211 Encounter for screening for malignant neoplasm of colon: Secondary | ICD-10-CM | POA: Diagnosis not present

## 2019-05-04 DIAGNOSIS — K5730 Diverticulosis of large intestine without perforation or abscess without bleeding: Secondary | ICD-10-CM | POA: Diagnosis not present

## 2019-05-04 DIAGNOSIS — Z800 Family history of malignant neoplasm of digestive organs: Secondary | ICD-10-CM | POA: Diagnosis not present

## 2019-05-04 DIAGNOSIS — K635 Polyp of colon: Secondary | ICD-10-CM | POA: Diagnosis not present

## 2019-05-04 DIAGNOSIS — Z86010 Personal history of colonic polyps: Secondary | ICD-10-CM | POA: Diagnosis not present

## 2019-05-17 ENCOUNTER — Other Ambulatory Visit: Payer: Self-pay

## 2019-05-17 ENCOUNTER — Other Ambulatory Visit: Payer: Self-pay | Admitting: Internal Medicine

## 2019-05-17 MED ORDER — HYDROCHLOROTHIAZIDE 25 MG PO TABS: 25.0000 mg | ORAL_TABLET | Freq: Every day | ORAL | 0 refills | Status: DC

## 2019-05-17 MED ORDER — ESCITALOPRAM OXALATE 5 MG PO TABS: ORAL_TABLET | ORAL | 0 refills | Status: DC

## 2019-05-22 DIAGNOSIS — Z20828 Contact with and (suspected) exposure to other viral communicable diseases: Secondary | ICD-10-CM | POA: Diagnosis not present

## 2019-05-25 DIAGNOSIS — J45909 Unspecified asthma, uncomplicated: Secondary | ICD-10-CM | POA: Diagnosis not present

## 2019-05-25 DIAGNOSIS — J329 Chronic sinusitis, unspecified: Secondary | ICD-10-CM | POA: Diagnosis not present

## 2019-05-25 DIAGNOSIS — R05 Cough: Secondary | ICD-10-CM | POA: Diagnosis not present

## 2019-06-01 DIAGNOSIS — R05 Cough: Secondary | ICD-10-CM | POA: Diagnosis not present

## 2019-06-01 DIAGNOSIS — R5383 Other fatigue: Secondary | ICD-10-CM | POA: Diagnosis not present

## 2019-06-01 DIAGNOSIS — R0981 Nasal congestion: Secondary | ICD-10-CM | POA: Diagnosis not present

## 2019-06-01 IMAGING — CT CT PARANASAL SINUSES LIMITED
1 of 2 series · 8 of 11 positions shown, 10 images · non-contrast
Comparison: Sinus radiographs 04/16/2015

CLINICAL DATA: Sinus and chest congestion for 4 months. Acute
maxillary sinusitis.

EXAM:
CT PARANASAL SINUS LIMITED WITHOUT CONTRAST
TECHNIQUE: Non-contiguous multidetector CT images of the paranasal sinuses were
obtained in a single plane without contrast.

[Series 4: limited sinus st · axial · 0.23mm/px · z∈[+138,+208]mm · 8 of 10 slices shown, 10 images]
[im 2/10  brain]
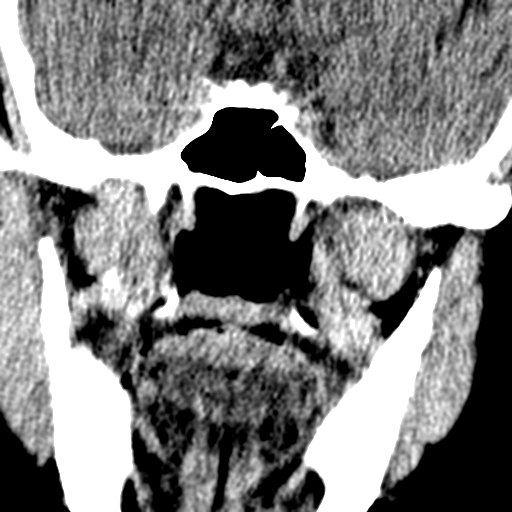
[im 2/10  bone]
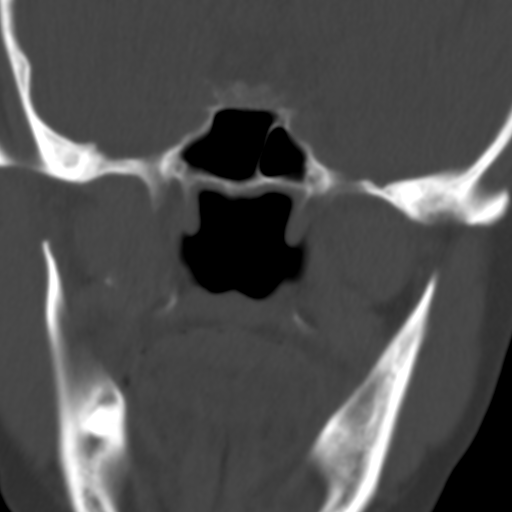
[im 3/10  bone]
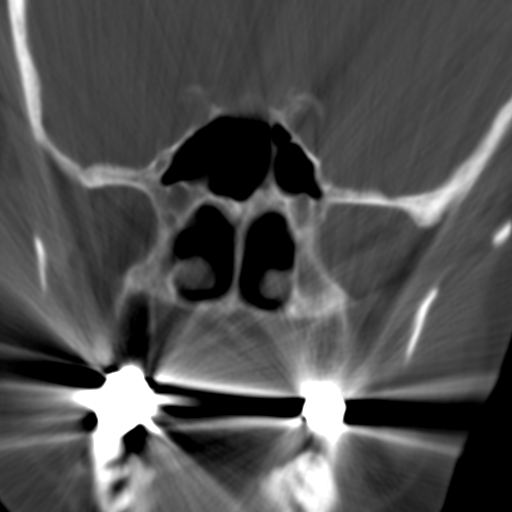
[im 4/10  bone]
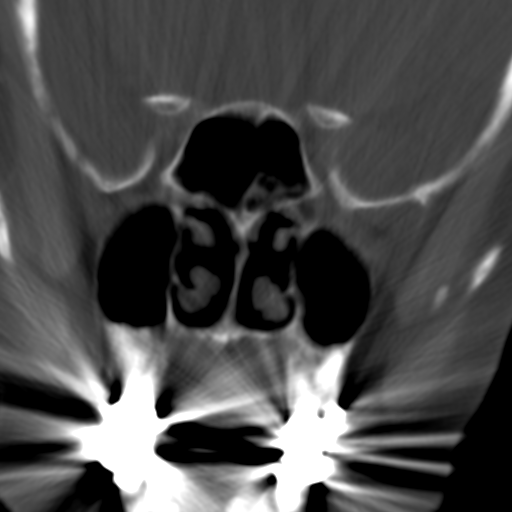
[im 5/10  bone]
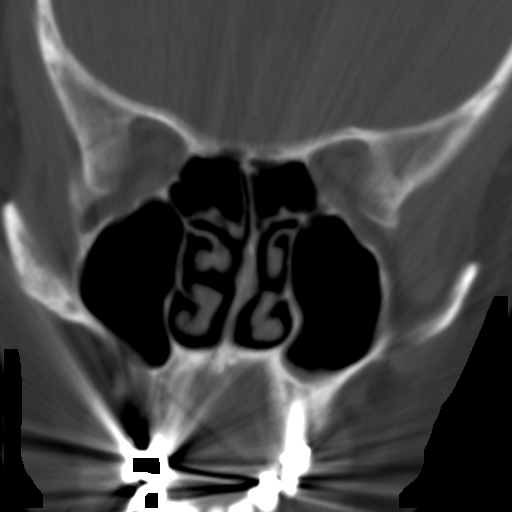
[im 6/10  brain]
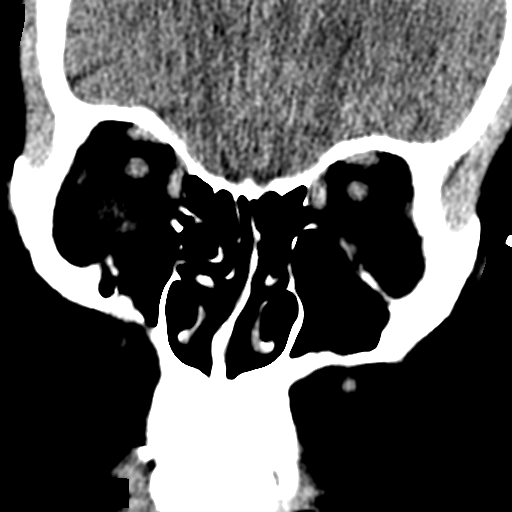
[im 6/10  bone]
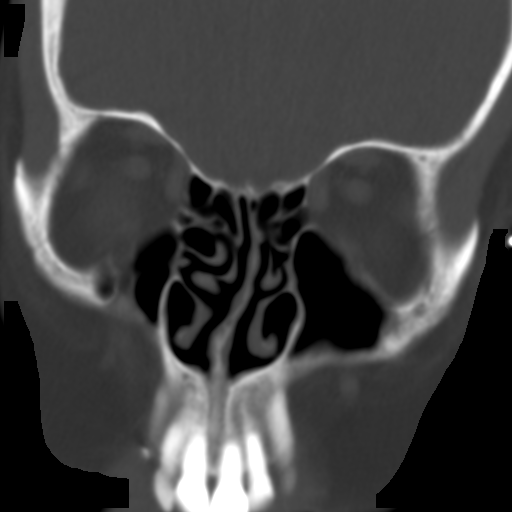
[im 7/10  bone]
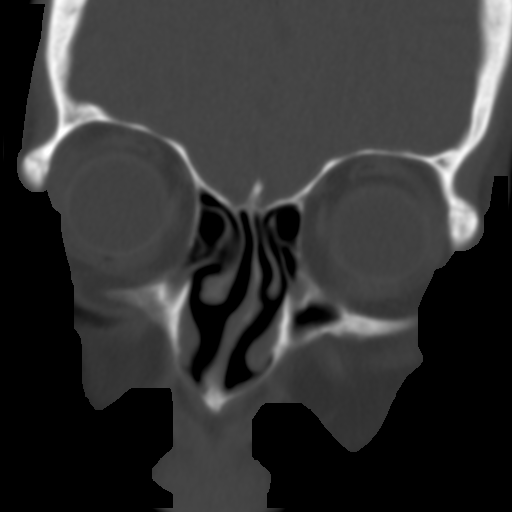
[im 8/10  bone]
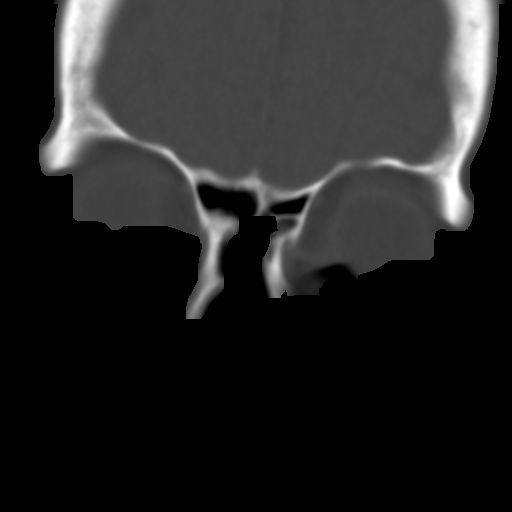
[im 9/10  bone]
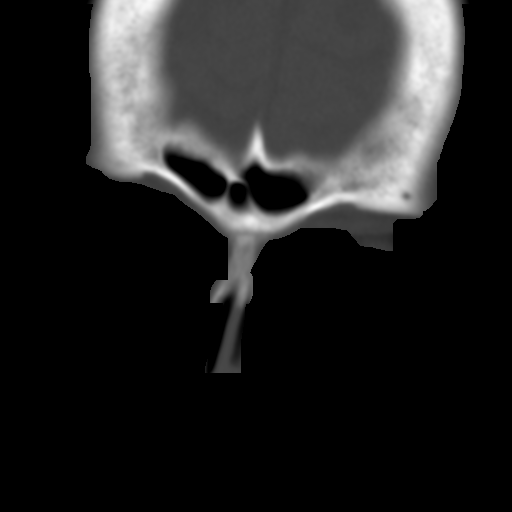

[8 of 11 positions shown; findings below may reference images not displayed]

FINDINGS: Mild mucosal thickening is present in the inferior right maxillary
sinus. No other significant mucosal disease is present. No fluid
levels are evident. Rightward nasal septal deviation extends 4 mm
from the midline
IMPRESSION: 1. Mild mucosal thickening along the floor of the right maxillary
sinus.
2. No acute fluid levels.

## 2019-06-12 IMAGING — DX DG CHEST 2V
2 series · 2 of 2 positions shown · non-contrast
Comparison: PA and lateral chest 07/28/2017 and 11/03/2015.

CLINICAL DATA: Cough, shortness of breath and wheezing for 3 days.

EXAM:
CHEST - 2 VIEW

[chest pa]
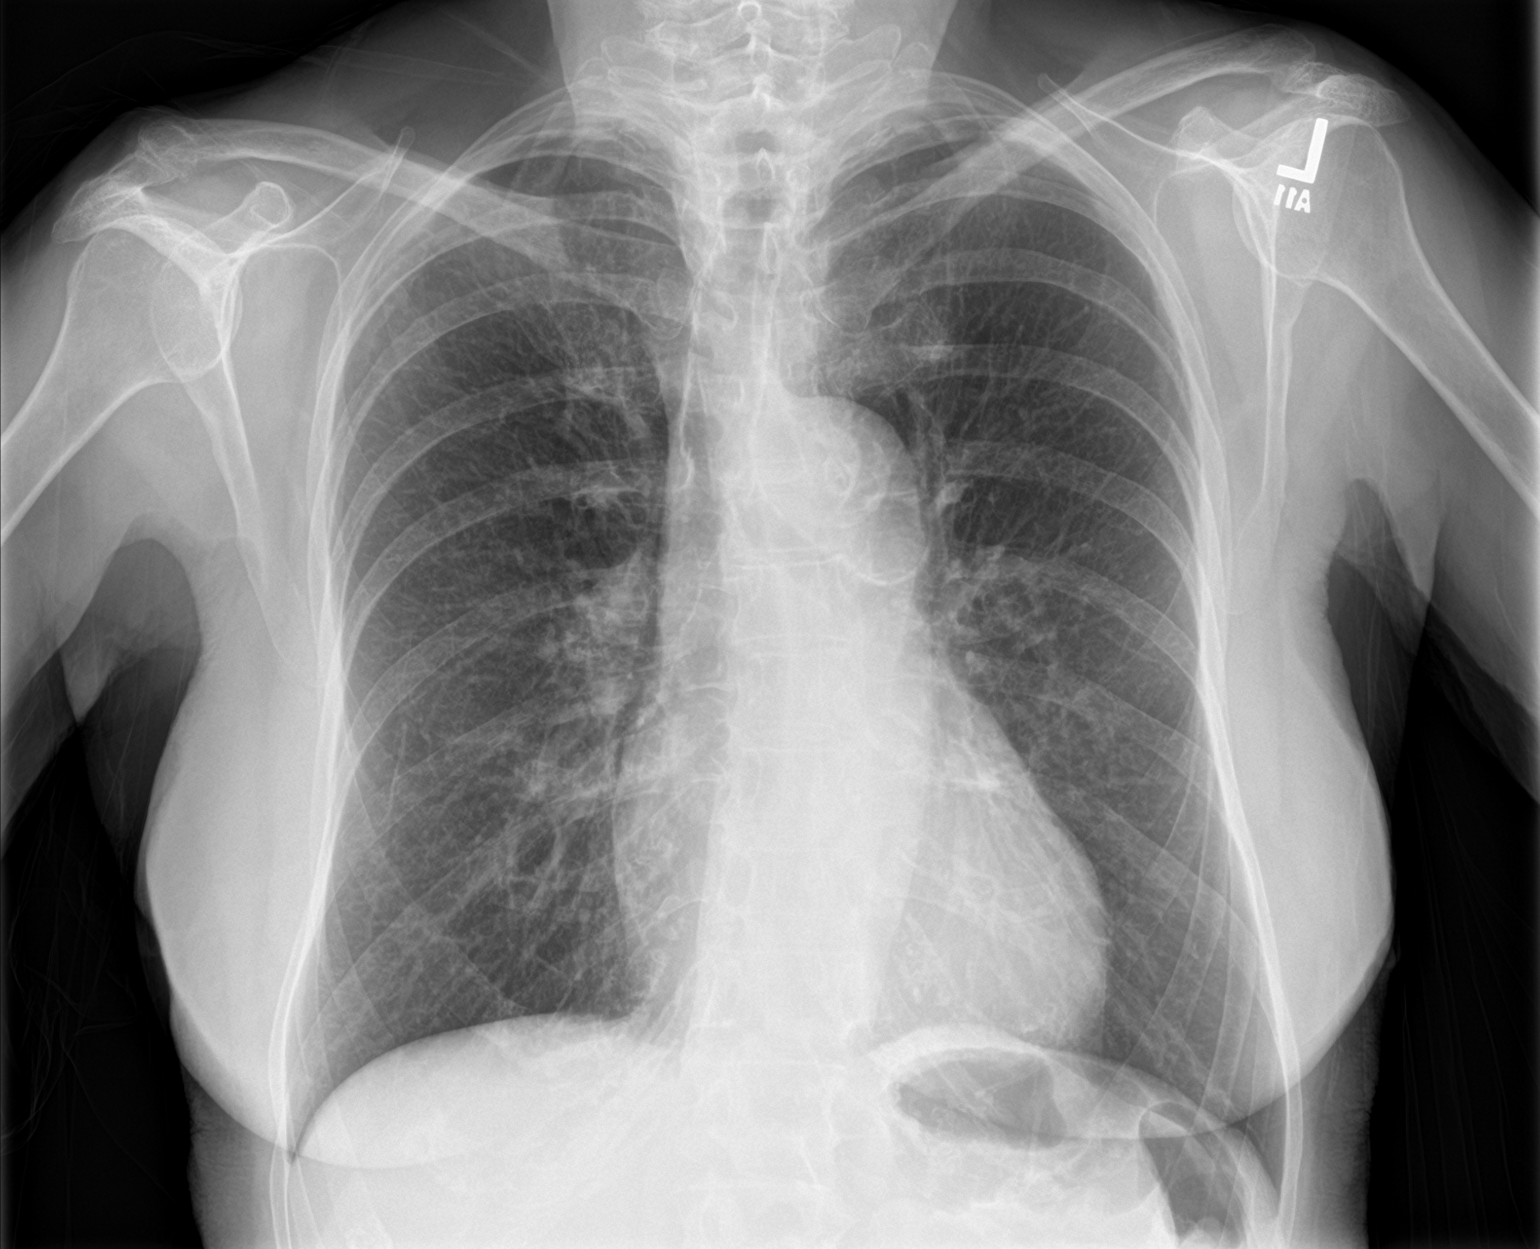

[chest lat]
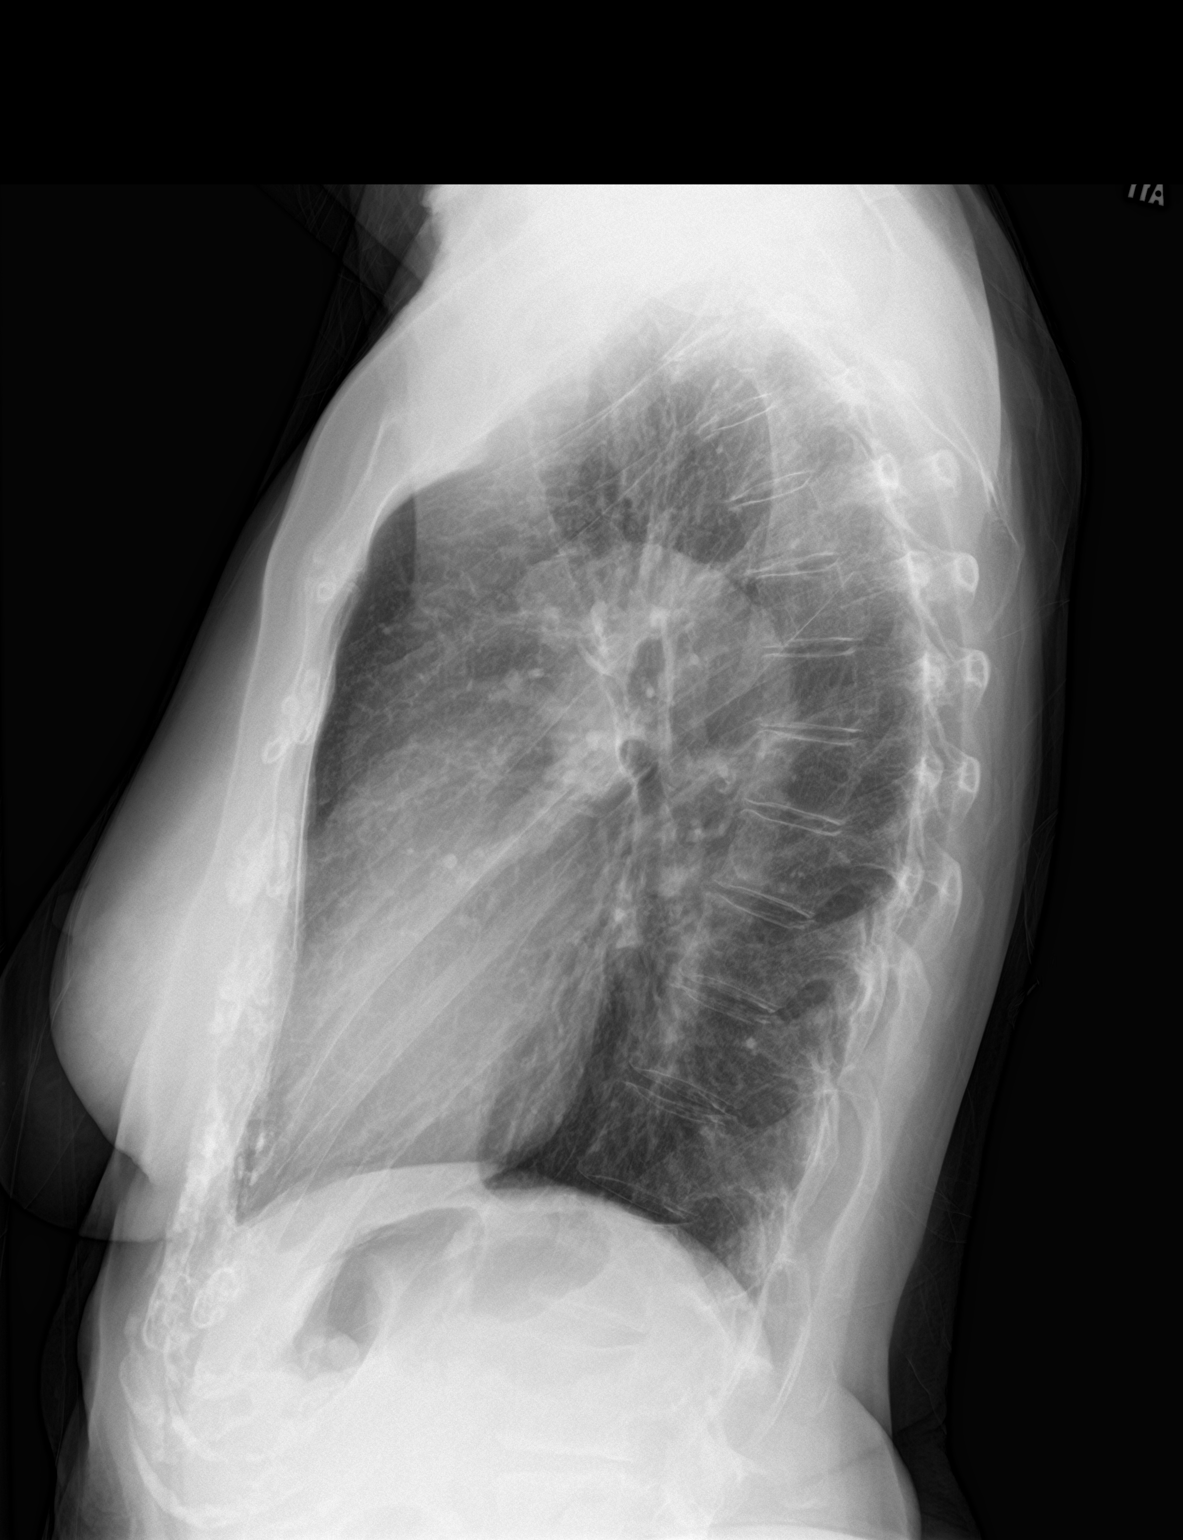

[2 of 2 positions shown; findings below may reference images not displayed]

FINDINGS: The lungs are clear. Heart size is normal. There is no pneumothorax
or pleural effusion. Aortic atherosclerosis is noted. No acute bony
abnormality.
IMPRESSION: No acute disease.

## 2019-06-13 ENCOUNTER — Other Ambulatory Visit: Payer: Self-pay | Admitting: Internal Medicine

## 2019-07-04 DIAGNOSIS — R0781 Pleurodynia: Secondary | ICD-10-CM | POA: Diagnosis not present

## 2019-07-27 ENCOUNTER — Other Ambulatory Visit: Payer: Self-pay | Admitting: Internal Medicine

## 2019-07-28 ENCOUNTER — Other Ambulatory Visit: Payer: Self-pay | Admitting: Internal Medicine

## 2019-08-01 DIAGNOSIS — Z20822 Contact with and (suspected) exposure to covid-19: Secondary | ICD-10-CM | POA: Diagnosis not present

## 2019-08-01 DIAGNOSIS — R509 Fever, unspecified: Secondary | ICD-10-CM | POA: Diagnosis not present

## 2019-08-01 DIAGNOSIS — R0981 Nasal congestion: Secondary | ICD-10-CM | POA: Diagnosis not present

## 2019-08-02 DIAGNOSIS — J069 Acute upper respiratory infection, unspecified: Secondary | ICD-10-CM | POA: Diagnosis not present

## 2019-08-25 ENCOUNTER — Other Ambulatory Visit: Payer: Self-pay | Admitting: Internal Medicine

## 2019-09-26 DIAGNOSIS — F419 Anxiety disorder, unspecified: Secondary | ICD-10-CM | POA: Diagnosis not present

## 2019-09-26 DIAGNOSIS — I10 Essential (primary) hypertension: Secondary | ICD-10-CM | POA: Diagnosis not present

## 2019-09-26 DIAGNOSIS — E785 Hyperlipidemia, unspecified: Secondary | ICD-10-CM | POA: Diagnosis not present

## 2019-10-24 ENCOUNTER — Other Ambulatory Visit: Payer: Self-pay | Admitting: Internal Medicine

## 2019-11-14 DIAGNOSIS — H524 Presbyopia: Secondary | ICD-10-CM | POA: Diagnosis not present

## 2019-11-28 DIAGNOSIS — F329 Major depressive disorder, single episode, unspecified: Secondary | ICD-10-CM | POA: Diagnosis not present

## 2019-11-28 DIAGNOSIS — E785 Hyperlipidemia, unspecified: Secondary | ICD-10-CM | POA: Diagnosis not present

## 2019-11-28 DIAGNOSIS — I10 Essential (primary) hypertension: Secondary | ICD-10-CM | POA: Diagnosis not present

## 2019-11-28 DIAGNOSIS — J45909 Unspecified asthma, uncomplicated: Secondary | ICD-10-CM | POA: Diagnosis not present

## 2020-01-03 ENCOUNTER — Other Ambulatory Visit: Payer: Self-pay

## 2020-01-31 ENCOUNTER — Other Ambulatory Visit: Payer: Self-pay | Admitting: Internal Medicine

## 2020-02-13 DIAGNOSIS — Z20822 Contact with and (suspected) exposure to covid-19: Secondary | ICD-10-CM | POA: Diagnosis not present

## 2020-02-16 DIAGNOSIS — J45901 Unspecified asthma with (acute) exacerbation: Secondary | ICD-10-CM | POA: Diagnosis not present

## 2020-02-19 DIAGNOSIS — N390 Urinary tract infection, site not specified: Secondary | ICD-10-CM | POA: Diagnosis not present

## 2020-02-19 DIAGNOSIS — R399 Unspecified symptoms and signs involving the genitourinary system: Secondary | ICD-10-CM | POA: Diagnosis not present

## 2020-02-28 DIAGNOSIS — I10 Essential (primary) hypertension: Secondary | ICD-10-CM | POA: Diagnosis not present

## 2020-02-28 DIAGNOSIS — J019 Acute sinusitis, unspecified: Secondary | ICD-10-CM | POA: Diagnosis not present

## 2020-02-28 DIAGNOSIS — Z87440 Personal history of urinary (tract) infections: Secondary | ICD-10-CM | POA: Diagnosis not present

## 2020-02-28 DIAGNOSIS — F329 Major depressive disorder, single episode, unspecified: Secondary | ICD-10-CM | POA: Diagnosis not present

## 2020-03-01 ENCOUNTER — Other Ambulatory Visit: Payer: Self-pay | Admitting: Internal Medicine

## 2020-03-04 DIAGNOSIS — R05 Cough: Secondary | ICD-10-CM | POA: Diagnosis not present

## 2020-03-04 DIAGNOSIS — R768 Other specified abnormal immunological findings in serum: Secondary | ICD-10-CM | POA: Diagnosis not present

## 2020-03-04 DIAGNOSIS — J329 Chronic sinusitis, unspecified: Secondary | ICD-10-CM | POA: Diagnosis not present

## 2020-03-04 DIAGNOSIS — J309 Allergic rhinitis, unspecified: Secondary | ICD-10-CM | POA: Diagnosis not present

## 2020-03-04 DIAGNOSIS — J4550 Severe persistent asthma, uncomplicated: Secondary | ICD-10-CM | POA: Diagnosis not present

## 2020-03-27 DIAGNOSIS — H35363 Drusen (degenerative) of macula, bilateral: Secondary | ICD-10-CM | POA: Diagnosis not present

## 2020-03-27 DIAGNOSIS — Z961 Presence of intraocular lens: Secondary | ICD-10-CM | POA: Diagnosis not present

## 2020-04-02 DIAGNOSIS — Z23 Encounter for immunization: Secondary | ICD-10-CM | POA: Diagnosis not present

## 2020-05-02 ENCOUNTER — Ambulatory Visit: Payer: Medicare Other | Admitting: Internal Medicine

## 2020-05-10 DIAGNOSIS — L720 Epidermal cyst: Secondary | ICD-10-CM | POA: Diagnosis not present

## 2020-05-10 DIAGNOSIS — L814 Other melanin hyperpigmentation: Secondary | ICD-10-CM | POA: Diagnosis not present

## 2020-05-10 DIAGNOSIS — L821 Other seborrheic keratosis: Secondary | ICD-10-CM | POA: Diagnosis not present

## 2020-05-10 DIAGNOSIS — D692 Other nonthrombocytopenic purpura: Secondary | ICD-10-CM | POA: Diagnosis not present

## 2020-05-15 NOTE — Progress Notes (Deleted)
F never smoker  Asthmatic bronchitis; constant cough per patient. Silent GERD PFT 10/06/2007- WNL. FVC 2.66/ 88%, FEV1 2.14/ 98%, FEV1/FVC 0.8, TLC 105%, DLCO 98% Allergy Profile 08/01/13- Total IgE 151.4, Broad elevations for many common environmental allergens FENO 05/21/16- 48= elevation consistent with allergic component to asthma CBC 10/01/17- EOS 11.2 Sputum cultures 10/01/17-normal flora, negative AFB, negative fungal Total IgE 10/14/17- 126   Weight 125 lbs ---------------------------------------------   04/27/2019- 79 year old female never smoker followed for  Allergic asthmatic bronchitis, chronic cough, allergic rhinitis complicated by GERD -----Asthma; pt states her breathing is significantly better since LOV; however, has been taking Mucinex for about 1 week d/t sinus congestion and postnasal drainage Now on Symbicort 80. Had flu shot Still following with Dr Evie Lacks at [facility]. Says management for silent reflux has helped cough- avoiding alcohol, caffeine, chocolate, citrus and peppermint.  Walks daily with mask. Covid careful.  This week having  maxillary sinus pressure, post nasal drip. No fever or purulent. Using neti pot.  05/16/20- 79 year old female never smoker followed for  Allergic asthmatic bronchitis, chronic cough, allergic rhinitis complicated by GERD Symbicort 80, Ventolin hfa, singulair, neb albuterol Seen [facility]/ Allergy Dr Clyde Canterbury 8/16- Cefdinir for sinusitis, Asthma control: rescue inhaler in past week: 6, nighttime asthma in past month: 0, nebulizer in past week: 3, recent peak flow reading: NA, ER visits for asthma this year: 0, hospitalizations for asthma this year: 0, Systemic steroid treatment this year: 0.  io98 qaaa  ROS-see HPI + = positive Constitutional:   + weight loss, night sweats, fevers, chills, +fatigue, lassitude. HEENT:  +  headaches, difficulty swallowing, tooth/dental problems, sore throat,       No-  sneezing, itching, ear  ache, nasal congestion, +post nasal drip,  CV:  No-   chest pain, orthopnea, PND, swelling in lower extremities, anasarca,                                                    dizziness, palpitations Resp:   shortness of breath with exertion or at rest.              productive cough,   non-productive cough,  No- coughing up of blood.                change in color of mucus.  No- wheezing.   Skin: No-   rash or lesions. GI:  No- heartburn, indigestion, abdominal pain, nausea, vomiting,  GU:  MS:  No-   joint pain or swelling.   Neuro-     nothing unusual Psych:  No- change in mood or affect. No depression +anxiety.  No memory loss.  OBJ- Physical Exam General- Alert, Oriented, Affect-appropriate, Distress- none acute, trim, alert Skin- rash-none, lesions- none, excoriation- none   Lymphadenopathy- none Head- atraumatic            Eyes- Gross vision intact, PERRLA, conjunctivae and secretions clear            Ears- Hearing, canals-normal,             Nose- + turbinate edema, no-Septal dev, mucus, polyps, erosion, perforation             Throat- Mallampati II , mucosa clear , drainage- none, tonsils- atrophic Neck- flexible , trachea midline, no stridor , thyroid nl, carotid no bruit  Chest - symmetrical excursion , unlabored           Heart/CV- RRR , no murmur , no gallop  , no rub, nl s1 s2                           - JVD- none , edema- none, stasis changes- none, varices- none           Lung-  Wheeze+ mild expiratory upper lobes , cough- none, dullness-none, rub- none,            Chest wall-  Abd-  Br/ Gen/ Rectal- Not done, not indicated Extrem- cyanosis- none, clubbing, none, atrophy- none, strength- nl Neuro- grossly intact to observation

## 2020-05-16 ENCOUNTER — Ambulatory Visit: Payer: Medicare Other | Admitting: Internal Medicine

## 2020-05-16 DIAGNOSIS — N750 Cyst of Bartholin's gland: Secondary | ICD-10-CM | POA: Diagnosis not present

## 2020-05-17 DIAGNOSIS — N751 Abscess of Bartholin's gland: Secondary | ICD-10-CM | POA: Diagnosis not present

## 2020-06-04 DIAGNOSIS — N898 Other specified noninflammatory disorders of vagina: Secondary | ICD-10-CM | POA: Diagnosis not present

## 2020-06-04 DIAGNOSIS — I10 Essential (primary) hypertension: Secondary | ICD-10-CM | POA: Diagnosis not present

## 2020-06-04 DIAGNOSIS — J45909 Unspecified asthma, uncomplicated: Secondary | ICD-10-CM | POA: Diagnosis not present

## 2020-06-04 DIAGNOSIS — F32A Depression, unspecified: Secondary | ICD-10-CM | POA: Diagnosis not present

## 2020-06-06 DIAGNOSIS — E785 Hyperlipidemia, unspecified: Secondary | ICD-10-CM | POA: Diagnosis not present

## 2020-06-06 DIAGNOSIS — I10 Essential (primary) hypertension: Secondary | ICD-10-CM | POA: Diagnosis not present

## 2020-06-06 DIAGNOSIS — Z131 Encounter for screening for diabetes mellitus: Secondary | ICD-10-CM | POA: Diagnosis not present

## 2020-06-06 DIAGNOSIS — F32A Depression, unspecified: Secondary | ICD-10-CM | POA: Diagnosis not present

## 2020-06-06 DIAGNOSIS — N898 Other specified noninflammatory disorders of vagina: Secondary | ICD-10-CM | POA: Diagnosis not present

## 2020-06-07 DIAGNOSIS — N751 Abscess of Bartholin's gland: Secondary | ICD-10-CM | POA: Diagnosis not present

## 2020-06-08 DIAGNOSIS — Z1231 Encounter for screening mammogram for malignant neoplasm of breast: Secondary | ICD-10-CM | POA: Diagnosis not present

## 2020-07-01 DIAGNOSIS — I160 Hypertensive urgency: Secondary | ICD-10-CM | POA: Diagnosis not present

## 2020-07-01 DIAGNOSIS — F430 Acute stress reaction: Secondary | ICD-10-CM | POA: Diagnosis not present

## 2020-07-01 DIAGNOSIS — I10 Essential (primary) hypertension: Secondary | ICD-10-CM | POA: Diagnosis not present

## 2020-07-02 DIAGNOSIS — I10 Essential (primary) hypertension: Secondary | ICD-10-CM | POA: Diagnosis not present

## 2020-07-04 ENCOUNTER — Telehealth: Payer: Self-pay | Admitting: Internal Medicine

## 2020-07-04 MED ORDER — ALBUTEROL SULFATE (2.5 MG/3ML) 0.083% IN NEBU: 2.5000 mg | INHALATION_SOLUTION | Freq: Four times a day (QID) | RESPIRATORY_TRACT | 12 refills | Status: DC | PRN

## 2020-07-04 NOTE — Telephone Encounter (Signed)
Called and spoke with patient to verify medication and pharmacy. Patient verified both. RX sent in. Nothing further needed at this time.

## 2020-07-05 ENCOUNTER — Other Ambulatory Visit: Payer: Self-pay | Admitting: Internal Medicine

## 2020-07-23 DIAGNOSIS — J45909 Unspecified asthma, uncomplicated: Secondary | ICD-10-CM | POA: Diagnosis not present

## 2020-07-23 DIAGNOSIS — I10 Essential (primary) hypertension: Secondary | ICD-10-CM | POA: Diagnosis not present

## 2020-07-25 ENCOUNTER — Other Ambulatory Visit: Payer: Self-pay

## 2020-07-25 ENCOUNTER — Encounter: Payer: Self-pay | Admitting: Internal Medicine

## 2020-07-25 ENCOUNTER — Ambulatory Visit: Payer: Medicare Other | Admitting: Internal Medicine

## 2020-07-25 DIAGNOSIS — J4541 Moderate persistent asthma with (acute) exacerbation: Secondary | ICD-10-CM | POA: Diagnosis not present

## 2020-07-25 DIAGNOSIS — K219 Gastro-esophageal reflux disease without esophagitis: Secondary | ICD-10-CM | POA: Diagnosis not present

## 2020-07-25 MED ORDER — ALBUTEROL SULFATE HFA 108 (90 BASE) MCG/ACT IN AERS: INHALATION_SPRAY | RESPIRATORY_TRACT | 12 refills | Status: AC

## 2020-07-25 MED ORDER — BREZTRI AEROSPHERE 160-9-4.8 MCG/ACT IN AERO
2.0000 | INHALATION_SPRAY | Freq: Two times a day (BID) | RESPIRATORY_TRACT | 0 refills | Status: AC
Start: 2020-07-25 — End: ?

## 2020-07-25 NOTE — Progress Notes (Signed)
F never smoker  Asthmatic bronchitis; constant cough per patient. Silent GERD, CAD on CT PFT 10/06/2007- WNL. FVC 2.66/ 88%, FEV1 2.14/ 98%, FEV1/FVC 0.8, TLC 105%, DLCO 98% Allergy Profile 08/01/13- Total IgE 151.4, Broad elevations for many common environmental allergens FENO 05/21/16- 48= elevation consistent with allergic component to asthma CBC 10/01/17- EOS 11.2 Sputum cultures 10/01/17-normal flora, negative AFB, negative fungal Total IgE 10/14/17- 126   Weight 125 lbs ---------------------------------------------  04/27/2019- 80 year old female never smoker followed for  Allergic asthmatic bronchitis, chronic cough, allergic rhinitis complicated by GERD, CAD on CT,  -----Asthma; pt states her breathing is significantly better since LOV; however, has been taking Mucinex for about 1 week d/t sinus congestion and postnasal drainage Now on Symbicort 80. Had flu shot Still following with Dr Carleene Mains at [facility]. Says management for silent reflux has helped cough- avoiding alcohol, caffeine, chocolate, citrus and peppermint.  Walks daily with mask. Covid careful.  This week having  maxillary sinus pressure, post nasal drip. No fever or purulent. Using neti pot.  07/25/20-  80 year old female never smoker followed for  Allergic asthmatic bronchitis, chronic cough, allergic rhinitis complicated by GERD Still following with Dr Carleene Mains at [facility] Neb albuterol, Ventolin hfa, Symbicort 80, Singulair, Covid vax- 3 Phizer Flu vax-had  Dr Lorelle Formosa note from August reviewed, noting uncertain role of allergy now, apparent improvement with control of reflux. Mild Positive ANCA has not evolved into anything so far.  ACT score 15 She is moving to an assisted living facility near [place] and will establish new PCP and other care there as needed.  In last 3 weeks noting more chest tightness, morning cough/ clear phlegm, some increased DOE but no fever or evident infection,  palpitation, chest pain or fluid retention. Using nebulizer more- 1x/day, occasional Ventolin rescue-0-2x/ day. CT chest 11/12/17-  IMPRESSION: 1. No evidence of interstitial lung disease. 2. Aortic atherosclerosis (ICD10-170.0). Three-vessel coronary artery calcification. 3. Low-attenuation thyroid nodules.   ROS-see HPI + = positive Constitutional:   + weight loss, night sweats, fevers, chills, +fatigue, lassitude. HEENT:  +  headaches, difficulty swallowing, tooth/dental problems, sore throat,       No-  sneezing, itching, ear ache, nasal congestion, +post nasal drip,  CV:  No-   chest pain, orthopnea, PND, swelling in lower extremities, anasarca,                                                    dizziness, palpitations Resp:   shortness of breath with exertion or at rest.              productive cough,   non-productive cough,  No- coughing up of blood.                change in color of mucus.  No- wheezing.   Skin: No-   rash or lesions. GI:  No- heartburn, indigestion, abdominal pain, nausea, vomiting,  GU:  MS:  No-   joint pain or swelling.   Neuro-     nothing unusual Psych:  No- change in mood or affect. No depression +anxiety.  No memory loss.  OBJ- Physical Exam General- Alert, Oriented, Affect-appropriate, Distress- none acute, trim, alert Skin- rash-none, lesions- none, excoriation- none   Lymphadenopathy- none Head- atraumatic  Eyes- Gross vision intact, PERRLA, conjunctivae and secretions clear            Ears- Hearing, canals-normal,             Nose- + turbinate edema, no-Septal dev, mucus, polyps, erosion, perforation             Throat- Mallampati II , mucosa clear , drainage- none, tonsils- atrophic Neck- flexible , trachea midline, no stridor , thyroid nl, carotid no bruit Chest - symmetrical excursion , unlabored           Heart/CV- RRR , no murmur , no gallop  , no rub, nl s1 s2                           - JVD- none , edema- none, stasis changes-  none, varices- none           Lung-  Wheeze+ mild expiratory upper lobes , cough- none, dullness-none, rub- none,            Chest wall-  Abd-  Br/ Gen/ Rectal- Not done, not indicated Extrem- cyanosis- none, clubbing, none, atrophy- none, strength- nl Neuro- grossly intact to observation

## 2020-07-25 NOTE — Patient Instructions (Signed)
Refill sent for Ventolin rescue inhaler  Sample x 2 Breztri inhaler    Inhale 2 puffs then rinse mouth, twice daily When the samples run out, go back to Symbicort. See if one works better.  Please call if we can help

## 2020-07-26 NOTE — Assessment & Plan Note (Addendum)
Modest exacerbation w/o infection. Will see if boosting her inhlaer regimen short-term is sufficient as discussed. Plan- samples Breztri for trial, refill Ventolin. Continue following with Dr Jacqualine Mau. Anticipate establishing new local pulmonary in her new town if needed.

## 2020-07-26 NOTE — Assessment & Plan Note (Signed)
Better control has correlated with less reactive airway problem. Plan- continue attention to reflux precautions.

## 2020-08-08 DIAGNOSIS — N6489 Other specified disorders of breast: Secondary | ICD-10-CM | POA: Diagnosis not present

## 2020-08-08 DIAGNOSIS — R928 Other abnormal and inconclusive findings on diagnostic imaging of breast: Secondary | ICD-10-CM | POA: Diagnosis not present

## 2020-08-20 DIAGNOSIS — B001 Herpesviral vesicular dermatitis: Secondary | ICD-10-CM | POA: Diagnosis not present

## 2020-08-20 DIAGNOSIS — J019 Acute sinusitis, unspecified: Secondary | ICD-10-CM | POA: Diagnosis not present

## 2020-08-20 DIAGNOSIS — J45909 Unspecified asthma, uncomplicated: Secondary | ICD-10-CM | POA: Diagnosis not present

## 2020-09-02 DIAGNOSIS — J309 Allergic rhinitis, unspecified: Secondary | ICD-10-CM | POA: Diagnosis not present

## 2020-09-02 DIAGNOSIS — R053 Chronic cough: Secondary | ICD-10-CM | POA: Diagnosis not present

## 2020-09-02 DIAGNOSIS — J4550 Severe persistent asthma, uncomplicated: Secondary | ICD-10-CM | POA: Diagnosis not present

## 2020-09-02 DIAGNOSIS — J329 Chronic sinusitis, unspecified: Secondary | ICD-10-CM | POA: Diagnosis not present

## 2020-10-25 DIAGNOSIS — J0100 Acute maxillary sinusitis, unspecified: Secondary | ICD-10-CM | POA: Diagnosis not present

## 2020-10-25 DIAGNOSIS — Z20822 Contact with and (suspected) exposure to covid-19: Secondary | ICD-10-CM | POA: Diagnosis not present

## 2021-07-10 ENCOUNTER — Telehealth: Payer: Self-pay | Admitting: Internal Medicine

## 2021-07-10 MED ORDER — ALBUTEROL SULFATE (2.5 MG/3ML) 0.083% IN NEBU: 2.5000 mg | INHALATION_SOLUTION | Freq: Four times a day (QID) | RESPIRATORY_TRACT | 12 refills | Status: AC | PRN

## 2021-07-10 NOTE — Telephone Encounter (Signed)
Called and spoke with pt about refill request. Rx for pt's neb sol has been sent to preferred pharmacy for pt. Nothing further needed.

## 2021-07-31 ENCOUNTER — Ambulatory Visit: Payer: Medicare Other | Admitting: Internal Medicine

## 2021-12-10 ENCOUNTER — Other Ambulatory Visit: Payer: Self-pay | Admitting: Internal Medicine

## 2021-12-12 NOTE — Telephone Encounter (Signed)
Have to find other pharmacy that has medication for patient, since [facility] is out of stock at this location
